# Patient Record
Sex: Male | Born: 1950 | Race: White | Hispanic: No | Smoking: Never smoker
Health system: Southern US, Community
[De-identification: ages and names within clinical notes are randomized; demographics above are authoritative.]

## PROBLEM LIST (undated history)

## (undated) DIAGNOSIS — N200 Calculus of kidney: Secondary | ICD-10-CM

## (undated) DIAGNOSIS — I10 Essential (primary) hypertension: Secondary | ICD-10-CM

## (undated) DIAGNOSIS — E785 Hyperlipidemia, unspecified: Secondary | ICD-10-CM

## (undated) DIAGNOSIS — E669 Obesity, unspecified: Secondary | ICD-10-CM

## (undated) DIAGNOSIS — D45 Polycythemia vera: Secondary | ICD-10-CM

## (undated) DIAGNOSIS — F32A Depression, unspecified: Secondary | ICD-10-CM

## (undated) DIAGNOSIS — I350 Nonrheumatic aortic (valve) stenosis: Secondary | ICD-10-CM

## (undated) DIAGNOSIS — R0989 Other specified symptoms and signs involving the circulatory and respiratory systems: Secondary | ICD-10-CM

## (undated) DIAGNOSIS — F329 Major depressive disorder, single episode, unspecified: Secondary | ICD-10-CM

## (undated) DIAGNOSIS — G4733 Obstructive sleep apnea (adult) (pediatric): Secondary | ICD-10-CM

## (undated) DIAGNOSIS — D126 Benign neoplasm of colon, unspecified: Secondary | ICD-10-CM

## (undated) HISTORY — DX: Polycythemia vera: D45

## (undated) HISTORY — DX: Essential (primary) hypertension: I10

## (undated) HISTORY — DX: Nonrheumatic aortic (valve) stenosis: I35.0

## (undated) HISTORY — DX: Depression, unspecified: F32.A

## (undated) HISTORY — DX: Major depressive disorder, single episode, unspecified: F32.9

## (undated) HISTORY — PX: TONSILLECTOMY: SUR1361

## (undated) HISTORY — DX: Hyperlipidemia, unspecified: E78.5

## (undated) HISTORY — DX: Obesity, unspecified: E66.9

## (undated) HISTORY — DX: Other specified symptoms and signs involving the circulatory and respiratory systems: R09.89

## (undated) HISTORY — DX: Benign neoplasm of colon, unspecified: D12.6

## (undated) HISTORY — DX: Obstructive sleep apnea (adult) (pediatric): G47.33

## (undated) HISTORY — DX: Calculus of kidney: N20.0

---

## 1988-08-30 HISTORY — PX: HEMORRHOID SURGERY: SHX153

## 2000-02-02 ENCOUNTER — Other Ambulatory Visit: Admission: RE | Admit: 2000-02-02 | Discharge: 2000-02-02 | Payer: Self-pay | Admitting: Gastroenterology

## 2000-02-02 ENCOUNTER — Encounter (INDEPENDENT_AMBULATORY_CARE_PROVIDER_SITE_OTHER): Payer: Self-pay

## 2001-08-10 ENCOUNTER — Ambulatory Visit (HOSPITAL_COMMUNITY): Admission: RE | Admit: 2001-08-10 | Discharge: 2001-08-10 | Payer: Self-pay | Admitting: Internal Medicine

## 2005-03-19 ENCOUNTER — Ambulatory Visit: Payer: Self-pay | Admitting: Internal Medicine

## 2005-03-24 ENCOUNTER — Ambulatory Visit: Payer: Self-pay | Admitting: Internal Medicine

## 2005-05-24 ENCOUNTER — Ambulatory Visit: Payer: Self-pay | Admitting: Internal Medicine

## 2005-06-10 ENCOUNTER — Ambulatory Visit: Payer: Self-pay

## 2005-07-29 ENCOUNTER — Ambulatory Visit: Payer: Self-pay | Admitting: Internal Medicine

## 2005-08-09 ENCOUNTER — Ambulatory Visit (HOSPITAL_COMMUNITY): Admission: RE | Admit: 2005-08-09 | Discharge: 2005-08-09 | Payer: Self-pay | Admitting: Internal Medicine

## 2005-09-20 ENCOUNTER — Ambulatory Visit: Payer: Self-pay | Admitting: Internal Medicine

## 2005-09-28 ENCOUNTER — Ambulatory Visit: Payer: Self-pay | Admitting: Pulmonary Disease

## 2005-09-29 ENCOUNTER — Ambulatory Visit (HOSPITAL_BASED_OUTPATIENT_CLINIC_OR_DEPARTMENT_OTHER): Admission: RE | Admit: 2005-09-29 | Discharge: 2005-09-29 | Payer: Self-pay | Admitting: Internal Medicine

## 2005-10-19 ENCOUNTER — Ambulatory Visit: Payer: Self-pay | Admitting: Pulmonary Disease

## 2005-11-22 ENCOUNTER — Ambulatory Visit: Payer: Self-pay | Admitting: Pulmonary Disease

## 2006-03-28 ENCOUNTER — Encounter: Payer: Self-pay | Admitting: Cardiology

## 2006-03-28 ENCOUNTER — Ambulatory Visit: Payer: Self-pay | Admitting: Cardiology

## 2006-03-28 ENCOUNTER — Observation Stay (HOSPITAL_COMMUNITY): Admission: EM | Admit: 2006-03-28 | Discharge: 2006-03-29 | Payer: Self-pay | Admitting: Emergency Medicine

## 2006-04-11 ENCOUNTER — Encounter: Payer: Self-pay | Admitting: Cardiology

## 2006-04-11 ENCOUNTER — Ambulatory Visit: Payer: Self-pay

## 2006-04-15 ENCOUNTER — Ambulatory Visit: Payer: Self-pay | Admitting: Internal Medicine

## 2006-04-29 ENCOUNTER — Ambulatory Visit: Payer: Self-pay | Admitting: Internal Medicine

## 2006-05-04 ENCOUNTER — Ambulatory Visit: Payer: Self-pay | Admitting: Pulmonary Disease

## 2006-05-05 ENCOUNTER — Ambulatory Visit: Payer: Self-pay | Admitting: Pulmonary Disease

## 2006-06-16 ENCOUNTER — Ambulatory Visit: Payer: Self-pay | Admitting: Internal Medicine

## 2006-07-26 ENCOUNTER — Ambulatory Visit: Payer: Self-pay | Admitting: Internal Medicine

## 2006-07-26 LAB — CONVERTED CEMR LAB
Basophils Relative: 0 % (ref 0.0–1.0)
HCT: 51.3 % (ref 39.0–52.0)
Hemoglobin: 17.7 g/dL — ABNORMAL HIGH (ref 13.0–17.0)
MCHC: 34.5 g/dL (ref 30.0–36.0)
Monocytes Absolute: 0.5 10*3/uL (ref 0.2–0.7)
Monocytes Relative: 5.7 % (ref 3.0–11.0)
RBC: 5.6 M/uL (ref 4.22–5.81)
RDW: 12.9 % (ref 11.5–14.6)
Uric Acid, Serum: 7.9 mg/dL — ABNORMAL HIGH (ref 2.4–7.0)

## 2006-12-07 ENCOUNTER — Ambulatory Visit: Payer: Self-pay | Admitting: Internal Medicine

## 2006-12-07 LAB — CONVERTED CEMR LAB
Cholesterol: 172 mg/dL (ref 0–200)
LDL Cholesterol: 120 mg/dL — ABNORMAL HIGH (ref 0–99)
Total CHOL/HDL Ratio: 4.5
Triglycerides: 70 mg/dL (ref 0–149)

## 2007-06-15 ENCOUNTER — Ambulatory Visit: Payer: Self-pay

## 2007-06-15 ENCOUNTER — Encounter: Payer: Self-pay | Admitting: Internal Medicine

## 2007-06-15 ENCOUNTER — Ambulatory Visit: Payer: Self-pay | Admitting: Internal Medicine

## 2007-07-14 ENCOUNTER — Ambulatory Visit: Payer: Self-pay | Admitting: Pulmonary Disease

## 2008-05-31 DIAGNOSIS — J309 Allergic rhinitis, unspecified: Secondary | ICD-10-CM | POA: Insufficient documentation

## 2008-05-31 DIAGNOSIS — E785 Hyperlipidemia, unspecified: Secondary | ICD-10-CM

## 2008-05-31 DIAGNOSIS — N200 Calculus of kidney: Secondary | ICD-10-CM | POA: Insufficient documentation

## 2008-05-31 DIAGNOSIS — I359 Nonrheumatic aortic valve disorder, unspecified: Secondary | ICD-10-CM

## 2008-05-31 DIAGNOSIS — I10 Essential (primary) hypertension: Secondary | ICD-10-CM

## 2008-05-31 DIAGNOSIS — G4730 Sleep apnea, unspecified: Secondary | ICD-10-CM | POA: Insufficient documentation

## 2008-05-31 DIAGNOSIS — F329 Major depressive disorder, single episode, unspecified: Secondary | ICD-10-CM

## 2008-06-03 ENCOUNTER — Ambulatory Visit: Payer: Self-pay | Admitting: Pulmonary Disease

## 2008-06-26 ENCOUNTER — Ambulatory Visit: Payer: Self-pay | Admitting: Internal Medicine

## 2008-06-26 LAB — CONVERTED CEMR LAB
ALT: 56 units/L — ABNORMAL HIGH (ref 0–53)
AST: 36 units/L (ref 0–37)
HDL: 29.9 mg/dL — ABNORMAL LOW (ref >39.0)
LDL Cholesterol: 102 mg/dL — ABNORMAL HIGH (ref 0–99)
Total CHOL/HDL Ratio: 5.7
VLDL: 39 mg/dL (ref 0–40)

## 2008-06-28 ENCOUNTER — Ambulatory Visit: Payer: Self-pay | Admitting: Internal Medicine

## 2008-07-10 ENCOUNTER — Ambulatory Visit: Payer: Self-pay

## 2008-07-10 ENCOUNTER — Encounter: Payer: Self-pay | Admitting: Internal Medicine

## 2008-07-15 ENCOUNTER — Encounter: Payer: Self-pay | Admitting: Internal Medicine

## 2008-07-15 ENCOUNTER — Ambulatory Visit: Payer: Self-pay

## 2008-07-16 ENCOUNTER — Ambulatory Visit (HOSPITAL_COMMUNITY): Admission: RE | Admit: 2008-07-16 | Discharge: 2008-07-16 | Payer: Self-pay | Admitting: Internal Medicine

## 2008-07-29 ENCOUNTER — Ambulatory Visit: Payer: Self-pay | Admitting: Internal Medicine

## 2008-07-29 LAB — CONVERTED CEMR LAB
Basophils Absolute: 0.1 10*3/uL (ref 0.0–0.1)
Basophils Relative: 1.4 % (ref 0.0–3.0)
CO2: 32 meq/L (ref 19–32)
Calcium: 9.2 mg/dL (ref 8.4–10.5)
Creatinine, Ser: 1.5 mg/dL (ref 0.4–1.5)
GFR calc Af Amer: 62 mL/min
HCT: 49.5 % (ref 39.0–52.0)
Hemoglobin: 17.1 g/dL — ABNORMAL HIGH (ref 13.0–17.0)
Lymphocytes Relative: 19.6 % (ref 12.0–46.0)
MCHC: 34.5 g/dL (ref 30.0–36.0)
MCV: 92.3 fL (ref 78.0–100.0)
Monocytes Absolute: 0.8 10*3/uL (ref 0.1–1.0)
Neutro Abs: 3.9 10*3/uL (ref 1.4–7.7)
RBC: 5.36 M/uL (ref 4.22–5.81)
RDW: 12.3 % (ref 11.5–14.6)
aPTT: 54.9 s — ABNORMAL HIGH (ref 21.7–29.8)

## 2008-08-01 ENCOUNTER — Inpatient Hospital Stay (HOSPITAL_BASED_OUTPATIENT_CLINIC_OR_DEPARTMENT_OTHER): Admission: RE | Admit: 2008-08-01 | Discharge: 2008-08-01 | Payer: Self-pay | Admitting: Cardiovascular Disease

## 2008-08-01 ENCOUNTER — Ambulatory Visit: Payer: Self-pay | Admitting: Cardiovascular Disease

## 2008-08-29 ENCOUNTER — Ambulatory Visit: Payer: Self-pay | Admitting: Internal Medicine

## 2008-10-14 DIAGNOSIS — E669 Obesity, unspecified: Secondary | ICD-10-CM | POA: Insufficient documentation

## 2008-10-15 HISTORY — PX: AORTIC VALVE REPLACEMENT: SHX41

## 2008-10-28 ENCOUNTER — Ambulatory Visit: Payer: Self-pay | Admitting: Internal Medicine

## 2008-10-28 LAB — CONVERTED CEMR LAB
AST: 26 units/L (ref 0–37)
Calcium: 9.3 mg/dL (ref 8.4–10.5)
Chloride: 104 meq/L (ref 96–112)
Creatinine, Ser: 1.7 mg/dL — ABNORMAL HIGH (ref 0.4–1.5)
GFR calc non Af Amer: 45 mL/min
HCT: 43 % (ref 39.0–52.0)
MCV: 91.6 fL (ref 78.0–100.0)
Platelets: 418 10*3/uL — ABNORMAL HIGH (ref 150–400)
RDW: 12.7 % (ref 11.5–14.6)
Sodium: 139 meq/L (ref 135–145)

## 2008-11-21 ENCOUNTER — Encounter (HOSPITAL_COMMUNITY): Admission: RE | Admit: 2008-11-21 | Discharge: 2009-02-19 | Payer: Self-pay | Admitting: Internal Medicine

## 2008-11-22 ENCOUNTER — Encounter: Payer: Self-pay | Admitting: Internal Medicine

## 2008-11-22 ENCOUNTER — Ambulatory Visit: Payer: Self-pay | Admitting: Internal Medicine

## 2008-11-22 LAB — CONVERTED CEMR LAB
BUN: 17 mg/dL (ref 6–23)
Creatinine, Ser: 1.1 mg/dL (ref 0.4–1.5)
GFR calc non Af Amer: 73.66 mL/min (ref >60)

## 2008-12-06 ENCOUNTER — Ambulatory Visit: Payer: Self-pay | Admitting: Internal Medicine

## 2008-12-19 ENCOUNTER — Encounter: Payer: Self-pay | Admitting: Internal Medicine

## 2008-12-19 ENCOUNTER — Ambulatory Visit: Payer: Self-pay | Admitting: Internal Medicine

## 2008-12-19 DIAGNOSIS — E663 Overweight: Secondary | ICD-10-CM | POA: Insufficient documentation

## 2008-12-20 LAB — CONVERTED CEMR LAB
Calcium: 9.6 mg/dL (ref 8.4–10.5)
GFR calc non Af Amer: 73.64 mL/min (ref >60)
Magnesium: 2.5 mg/dL (ref 1.5–2.5)
Sodium: 140 meq/L (ref 135–145)

## 2008-12-30 ENCOUNTER — Ambulatory Visit: Payer: Self-pay

## 2008-12-30 ENCOUNTER — Encounter: Payer: Self-pay | Admitting: Internal Medicine

## 2009-01-08 ENCOUNTER — Telehealth: Payer: Self-pay | Admitting: Internal Medicine

## 2009-01-09 ENCOUNTER — Encounter: Payer: Self-pay | Admitting: Internal Medicine

## 2009-01-09 ENCOUNTER — Ambulatory Visit (HOSPITAL_COMMUNITY): Admission: RE | Admit: 2009-01-09 | Discharge: 2009-01-09 | Payer: Self-pay | Admitting: Cardiology

## 2009-01-09 ENCOUNTER — Ambulatory Visit: Payer: Self-pay | Admitting: Cardiology

## 2009-01-23 ENCOUNTER — Telehealth (INDEPENDENT_AMBULATORY_CARE_PROVIDER_SITE_OTHER): Payer: Self-pay | Admitting: *Deleted

## 2009-01-30 ENCOUNTER — Ambulatory Visit: Payer: Self-pay | Admitting: Internal Medicine

## 2009-01-30 DIAGNOSIS — R42 Dizziness and giddiness: Secondary | ICD-10-CM | POA: Insufficient documentation

## 2009-02-05 ENCOUNTER — Encounter: Payer: Self-pay | Admitting: Internal Medicine

## 2009-02-27 ENCOUNTER — Ambulatory Visit: Payer: Self-pay | Admitting: Gastroenterology

## 2009-03-13 ENCOUNTER — Encounter: Payer: Self-pay | Admitting: Gastroenterology

## 2009-03-13 ENCOUNTER — Encounter: Payer: Self-pay | Admitting: Internal Medicine

## 2009-03-13 ENCOUNTER — Ambulatory Visit: Payer: Self-pay | Admitting: Gastroenterology

## 2009-03-17 ENCOUNTER — Telehealth (INDEPENDENT_AMBULATORY_CARE_PROVIDER_SITE_OTHER): Payer: Self-pay | Admitting: *Deleted

## 2009-03-18 ENCOUNTER — Encounter: Payer: Self-pay | Admitting: Gastroenterology

## 2009-03-24 ENCOUNTER — Encounter: Payer: Self-pay | Admitting: Internal Medicine

## 2009-04-07 ENCOUNTER — Telehealth: Payer: Self-pay | Admitting: Internal Medicine

## 2009-05-08 ENCOUNTER — Ambulatory Visit: Payer: Self-pay | Admitting: Internal Medicine

## 2009-05-16 ENCOUNTER — Encounter: Payer: Self-pay | Admitting: Internal Medicine

## 2009-05-21 ENCOUNTER — Encounter: Payer: Self-pay | Admitting: Internal Medicine

## 2009-07-16 ENCOUNTER — Encounter (INDEPENDENT_AMBULATORY_CARE_PROVIDER_SITE_OTHER): Payer: Self-pay | Admitting: *Deleted

## 2009-11-13 ENCOUNTER — Ambulatory Visit: Payer: Self-pay | Admitting: Internal Medicine

## 2009-11-13 DIAGNOSIS — Z954 Presence of other heart-valve replacement: Secondary | ICD-10-CM

## 2009-11-13 DIAGNOSIS — I119 Hypertensive heart disease without heart failure: Secondary | ICD-10-CM | POA: Insufficient documentation

## 2009-12-01 ENCOUNTER — Telehealth: Payer: Self-pay | Admitting: Internal Medicine

## 2009-12-04 ENCOUNTER — Ambulatory Visit: Payer: Self-pay

## 2009-12-04 ENCOUNTER — Ambulatory Visit: Payer: Self-pay | Admitting: Cardiology

## 2009-12-04 ENCOUNTER — Ambulatory Visit (HOSPITAL_COMMUNITY): Admission: RE | Admit: 2009-12-04 | Discharge: 2009-12-04 | Payer: Self-pay | Admitting: Internal Medicine

## 2009-12-04 ENCOUNTER — Encounter (INDEPENDENT_AMBULATORY_CARE_PROVIDER_SITE_OTHER): Payer: Self-pay | Admitting: *Deleted

## 2009-12-04 ENCOUNTER — Ambulatory Visit: Payer: Self-pay | Admitting: Internal Medicine

## 2009-12-04 ENCOUNTER — Encounter: Payer: Self-pay | Admitting: Internal Medicine

## 2009-12-09 LAB — CONVERTED CEMR LAB
Eosinophils Relative: 3.4 % (ref 0.0–5.0)
GFR calc non Af Amer: 73.39 mL/min (ref >60)
HCT: 50.8 % (ref 39.0–52.0)
Hemoglobin: 17.5 g/dL — ABNORMAL HIGH (ref 13.0–17.0)
Lymphs Abs: 2.2 10*3/uL (ref 0.7–4.0)
Monocytes Relative: 7.6 % (ref 3.0–12.0)
Platelets: 183 10*3/uL (ref 150.0–400.0)
Potassium: 3.2 meq/L — ABNORMAL LOW (ref 3.5–5.1)
Sodium: 141 meq/L (ref 135–145)
TSH: 1.42 microintl units/mL (ref 0.35–5.50)
WBC: 9.4 10*3/uL (ref 4.5–10.5)

## 2009-12-15 ENCOUNTER — Encounter: Payer: Self-pay | Admitting: Internal Medicine

## 2009-12-15 ENCOUNTER — Ambulatory Visit: Payer: Self-pay | Admitting: Internal Medicine

## 2009-12-15 ENCOUNTER — Telehealth (INDEPENDENT_AMBULATORY_CARE_PROVIDER_SITE_OTHER): Payer: Self-pay | Admitting: *Deleted

## 2009-12-15 DIAGNOSIS — R002 Palpitations: Secondary | ICD-10-CM | POA: Insufficient documentation

## 2009-12-15 LAB — CONVERTED CEMR LAB
BUN: 15 mg/dL (ref 6–23)
Chloride: 107 meq/L (ref 96–112)
Potassium: 4.6 meq/L (ref 3.5–5.1)

## 2010-01-09 ENCOUNTER — Telehealth: Payer: Self-pay | Admitting: Internal Medicine

## 2010-01-09 ENCOUNTER — Encounter: Payer: Self-pay | Admitting: Internal Medicine

## 2010-04-03 ENCOUNTER — Encounter: Payer: Self-pay | Admitting: Internal Medicine

## 2010-04-06 ENCOUNTER — Encounter: Payer: Self-pay | Admitting: Internal Medicine

## 2010-04-10 ENCOUNTER — Ambulatory Visit: Payer: Self-pay | Admitting: Internal Medicine

## 2010-04-24 ENCOUNTER — Ambulatory Visit (HOSPITAL_COMMUNITY): Admission: RE | Admit: 2010-04-24 | Discharge: 2010-04-24 | Payer: Self-pay | Admitting: Urology

## 2010-05-14 ENCOUNTER — Telehealth: Payer: Self-pay | Admitting: Internal Medicine

## 2010-05-15 ENCOUNTER — Ambulatory Visit: Payer: Self-pay | Admitting: Internal Medicine

## 2010-05-16 LAB — CONVERTED CEMR LAB
AST: 25 units/L (ref 0–37)
Basophils Relative: 1.6 % (ref 0.0–3.0)
Bilirubin Urine: NEGATIVE
CO2: 32 meq/L (ref 19–32)
Chloride: 104 meq/L (ref 96–112)
Creatinine, Ser: 1.2 mg/dL (ref 0.4–1.5)
Eosinophils Absolute: 0.4 10*3/uL (ref 0.0–0.7)
HCT: 53 % — ABNORMAL HIGH (ref 39.0–52.0)
Hemoglobin, Urine: NEGATIVE
Hemoglobin: 18.2 g/dL — ABNORMAL HIGH (ref 13.0–17.0)
Lymphs Abs: 1.9 10*3/uL (ref 0.7–4.0)
MCHC: 34.4 g/dL (ref 30.0–36.0)
MCV: 91.2 fL (ref 78.0–100.0)
Monocytes Absolute: 0.8 10*3/uL (ref 0.1–1.0)
Neutro Abs: 4.8 10*3/uL (ref 1.4–7.7)
Potassium: 4.5 meq/L (ref 3.5–5.1)
RBC: 5.82 M/uL — ABNORMAL HIGH (ref 4.22–5.81)
Sodium: 143 meq/L (ref 135–145)
Total Protein, Urine: NEGATIVE mg/dL
Urine Glucose: NEGATIVE mg/dL
pH: 5 (ref 5.0–8.0)

## 2010-07-31 ENCOUNTER — Encounter: Admission: RE | Admit: 2010-07-31 | Discharge: 2010-07-31 | Payer: Self-pay | Admitting: Internal Medicine

## 2010-08-06 ENCOUNTER — Emergency Department (HOSPITAL_COMMUNITY): Admission: EM | Admit: 2010-08-06 | Discharge: 2010-02-12 | Payer: Self-pay | Admitting: Emergency Medicine

## 2010-09-29 NOTE — Assessment & Plan Note (Signed)
Summary: rov/jss   Primary Ireoluwa Grant:  Eric Form   History of Present Illness: Patient is a 60 year old with history of bicuspid AV and Aortic stenosis.  He is s/p AVR in 2010.  Also a hsitory of hypertension, obesity, sleep apnea.  I saw him in clinic earlier ths year. Since seen he has been doing well.  He denies dizziness, no chest pains.  He denies any dizziness like he has had in the past.  Still is not exercising regularly, blames it on the heat this summer.  Problems Prior to Update: 1)  Encounter For Long-term Use of Other Medications  (ICD-V58.69) 2)  Palpitations  (ICD-785.1) 3)  Aortic Valve Replacement, Hx of  (ICD-V43.3) 4)  Ben Htn Heart Disease Without Heart Fail  (ICD-402.10) 5)  Dizziness  (ICD-780.4) 6)  Overweight/obesity  (ICD-278.02) 7)  Aortic Stenosis  (ICD-424.1) 8)  Hypertension  (ICD-401.9) 9)  Sleep Apnea  (ICD-780.57) 10)  Dyslipidemia  (ICD-272.4) 11)  Depression  (ICD-311) 12)  Allergic Rhinitis  (ICD-477.9) 13)  Nephrolithiasis  (ICD-592.0)  Current Medications (verified): 1)  Wellbutrin Xl 300 Mg Xr24h-Tab (Bupropion Hcl) .... Take 1 Tablet By Mouth Once A Day 2)  Multivitamin Tablet Otc .... Take 1 Tablet By Mouth Once A Day 3)  Acetaminophen Pm 500-25 Mg Tabs (Diphenhydramine-Apap (Sleep)) .... As Needed 4)  Ibuprofen 400 Mg Tabs (Ibuprofen) .... As Needed 5)  Bystolic 5 Mg Tabs (Nebivolol Hcl) .... 1/3 of A  Tab By Mouth Once Daily 6)  Synthroid 25 Mcg Tabs (Levothyroxine Sodium) .... Take 1 Tablet By Mouth Once A Day 7)  Hydrochlorothiazide 12.5 Mg Tabs (Hydrochlorothiazide) .... Take One Tablet By Mouth Daily. 8)  Nuvigil .Marland Kitchen.. 1 Tab By Mouth Once Daily 9)  Androgel .... As Directed  Allergies: No Known Drug Allergies  Past History:  Family History: Last updated: 09/10/2008 Mother with CAD Father deceased.  Hx DM , CAD,  Social History: Last updated: 09/10/2008 Divorced. 1 adopted daughter Gerrit Friends No tobacco Rare EtOH  Past  medical, surgical, family and social histories (including risk factors) reviewed, and no changes noted (except as noted below).  Past Medical History: Reviewed history from 09/10/2008 and no changes required. Current Problems:  DEPRESSION (ICD-311) ALLERGIC RHINITIS (ICD-477.9) NEPHROLITHIASIS (ICD-592.0) DYSLIPIDEMIA (ICD-272.4) AORTIC STENOSIS (ICD-424.1) HYPERTENSION (ICD-401.9) SLEEP APNEA (ICD-780.57) Obesity  Sinus problems  Past Surgical History: Reviewed history from 01/30/2009 and no changes required. tonsillectomy AVR  Family History: Reviewed history from 09/10/2008 and no changes required. Mother with CAD Father deceased.  Hx DM , CAD,  Social History: Reviewed history from 09/10/2008 and no changes required. Divorced. 1 adopted daughter Gerrit Friends No tobacco Rare EtOH  Vital Signs:  Patient profile:   60 year old male Height:      73 inches Weight:      260 pounds BMI:     34.43 Pulse rate:   72 / minute Resp:     18 per minute BP sitting:   130 / 91  Vitals Entered By: Burnett Kanaris, CNA (April 10, 2010 3:21 PM)  Physical Exam  Additional Exam:  Patient is in NAD HEENT:  Normocephalic, atraumatic. EOMI, PERRLA.  Neck: JVP is normal. No thyromegaly. No bruits.  Lungs: clear to auscultation. No rales no wheezes.  Heart: Regular rate and rhythm. Normal S1, S2. No S3.   Gr. II/VI systolic murmur L sternal border to base.   PMI not displaced.  Abdomen:  Supple, nontender. Normal bowel sounds. No masses. No hepatomegaly.  Extremities:   Good distal pulses throughout. No lower extremity edema.  Musculoskeletal :moving all extremities.  Neuro:   alert and oriented x3.    EKG  Procedure date:  04/10/2010  Findings:      NSR.  70 bpm.  SL ST depression, biphasic T waves I,  VR to V6.  T wave inversion AVL.  Impression & Recommendations:  Problem # 1:  AORTIC VALVE REPLACEMENT, HX OF (ICD-V43.3) Cliniclally Kathlene November is doing well.  N dizzines, no  SOB.  ON exam the systolic murmur is less pronounced than previous.  His volume status must be better.  Echo in April  showed valve gradients good.  LV is hyperdynamic, smal with mild LVH  There was near cavity obliteration in the mid LV I would not change regimen for now.  Encouraged him to increase his activity with walking.    Problem # 2:  HYPERTENSION (ICD-401.9) Blood pressure is good.  COntinue current regimen.  Problem # 3:  SLEEP APNEA (ICD-780.57) Using CPAP regularly  Problem # 4:  DYSLIPIDEMIA (ICD-272.4) Need to check on fasting lipids.  Will contacted Dr. Alver Fisher office.  Problem # 5:  PALPITATIONS (ICD-785.1) Patient is without complaints.  Other Orders: EKG w/ Interpretation (93000)  Appended Document: rov/jss Recent CT for kidney stones showed small (6 mm) nodule in RLL lung   Will need f/u.  Appended Document: rov/jss Dr. Clelia Croft did not complete a lipid panel on this patient.  Appended Document: rov/jss Needs to come back for fasting lipid panel per Dr.Ross.  Appended Document: rov/jss Patient having kidney stone removal this week.Marland Kitchen.he will call me back to schedule fasting lipid panel.

## 2010-09-29 NOTE — Miscellaneous (Signed)
Summary: Outpatient Coinsurance Notice  Outpatient Coinsurance Notice   Imported By: Marylou Mccoy 12/05/2009 18:12:26  _____________________________________________________________________  External Attachment:    Type:   Image     Comment:   External Document

## 2010-09-29 NOTE — Procedures (Signed)
Summary: Holter and Event  Holter and Event   Imported By: Erle Crocker 01/23/2010 09:01:00  _____________________________________________________________________  External Attachment:    Type:   Image     Comment:   External Document

## 2010-09-29 NOTE — Progress Notes (Signed)
Summary: LOW BP  Phone Note Call from Patient Call back at Work Phone [phone] Call back at [phone]   Caller: Patient Summary of Call: BP THIS AM 90/50, WITHOUT BP MEDS Initial call taken by: Migdalia Dk,  December 01, 2009 10:13 AM  Follow-up for Phone Call        Called patient back...he states that he felt tired all weekend and just rested in bed....took his bp medications late yesterday 5 pm instead of the usual time in the am. Today at work his BP was 90/50 with HR of 78 taken by an Charity fundraiser ....she won't be back again until Friday. His only complaint is feeling tired. Advised him to hold BP medication today and will discuss issues with Dr.Mehgan Santmyer and call him back later. Follow-up by: Suzan Garibaldi RN  Additional Follow-up for Phone Call Additional follow up Details #1::        Patient  was tired all day saturday  Only got up for dinner.  Went to bed at 9.  Woke up  sun morning.  DID not take meds until 5 PM.  Went to bed at 11.  Today weak.  Did not take meds. Rec:  Hold meds.  Get echo thursday.  Will sched BMET, CBC, TSH, cortisol as well on thursday. Additional Follow-up by: Sherrill Raring, MD, [facility],  December 01, 2009 3:42 PM     Appended Document: LOW BP labs scheduled for Thursday.

## 2010-09-29 NOTE — Assessment & Plan Note (Signed)
Summary: add on pre syncope  845 am appointment   Visit Type:  Follow-up Primary Meah Jiron:  Eric Form  CC:  dizziness.  History of Present Illness: Patient is a 60 year old with a history of AS (s/p AVR in 2010).  I last saw him in clinic this summer. He called on Friday saying he had a spell at work.  He woke up, felt fine.  Had breakfast.  Micah Flesher to work.  At around 11 AM he was sitting at his desk when he became dizzy.  No chest tightness.  Some nausea.  No other pains.  Episode lasted about 10 minutes.  No palpitations.  After the episode he did go to a meeting but after that went home because he wasn't feeling right.  Over the weekend he had no further spells but just took it easy. Note that he  had recent kidney stones.  Required stent to remove.  Seen by Dr. Hillis Range earlier this wk and released.  No fevers, no diarrhea, no disuria.  Current Medications (verified): 1)  Wellbutrin Xl 300 Mg Xr24h-Tab (Bupropion Hcl) .... Take 1 Tablet By Mouth Once A Day 2)  Multivitamin Tablet Otc .... Take 1 Tablet By Mouth Once A Day 3)  Acetaminophen Pm 500-25 Mg Tabs (Diphenhydramine-Apap (Sleep)) .... As Needed 4)  Ibuprofen 400 Mg Tabs (Ibuprofen) .... As Needed 5)  Bystolic 5 Mg Tabs (Nebivolol Hcl) .... 1/3 of A  Tab By Mouth Once Daily 6)  Synthroid 25 Mcg Tabs (Levothyroxine Sodium) .... Take 1 Tablet By Mouth Once A Day 7)  Hydrochlorothiazide 12.5 Mg Tabs (Hydrochlorothiazide) .... Take One Tablet By Mouth Daily. 8)  Nuvigil .Marland Kitchen.. 1 Tab By Mouth Once Daily 9)  Androgel .... As Directed  Allergies (verified): No Known Drug Allergies  Past History:  Past medical, surgical, family and social histories (including risk factors) reviewed, and no changes noted (except as noted below).  Past Medical History: Reviewed history from 09/10/2008 and no changes required. Current Problems:  DEPRESSION (ICD-311) ALLERGIC RHINITIS (ICD-477.9) NEPHROLITHIASIS (ICD-592.0) DYSLIPIDEMIA  (ICD-272.4) AORTIC STENOSIS (ICD-424.1) HYPERTENSION (ICD-401.9) SLEEP APNEA (ICD-780.57) Obesity  Sinus problems  Past Surgical History: Reviewed history from 01/30/2009 and no changes required. tonsillectomy AVR  Family History: Reviewed history from 09/10/2008 and no changes required. Mother with CAD Father deceased.  Hx DM , CAD,  Social History: Reviewed history from 09/10/2008 and no changes required. Divorced. 1 adopted daughter Gerrit Friends No tobacco Rare EtOH  Vital Signs:  Patient profile:   60 year old male Height:      73 inches Weight:      260 pounds Pulse (ortho):   71 / minute BP standing:   134 / 96 Cuff size:   large  Vitals Entered By: Burnett Kanaris, CNA (May 15, 2010 8:51 AM)  Serial Vital Signs/Assessments:  Time      Position  BP       Pulse  Resp  Temp     By 0 min     Lying LA  122/91   63                    Burnett Kanaris, CNA 0 min     Sitting   142/98   66                    Burnett Kanaris, CNA 0 min     Standing  134/96   71  Burnett Kanaris, CNA 2 min     Standing  146/92   67                    Burnett Kanaris, CNA 3 min     Standing  137/91   70                    Burnett Kanaris, CNA  Comments: 0 min no dizziness when pt sat up-------When pt stood up he did feel a little dizziness intially---pt still has a lttle dizziness By: Burnett Kanaris, CNA  2 min pt continues to have just a little of dizziness By: Burnett Kanaris, CNA  3 min pt still has a little dizziness By: Burnett Kanaris, CNA    Physical Exam  Additional Exam:  Patient is a 60 year old in NAD HEENT:  Normocephalic, atraumatic. EOMI, PERRLA.  Neck: JVP is normal. No thyromegaly. No bruits.  Lungs: clear to auscultation. No rales no wheezes.  Heart: Regular rate and rhythm. Normal S1, S2. No S3.  Gr II/VI systolic murmur L sternal border. PMI not displaced.  Abdomen:  Supple.  Very mild RLQ tenderness.   Normal bowel sounds. No masses. No  hepatomegaly.  Extremities:   Good distal pulses throughout. No lower extremity edema.  Musculoskeletal :moving all extremities.  Neuro:   alert and oriented x3.    EKG  Procedure date:  05/15/2010  Findings:      NSR.  63 bpm.  T wave inversion V5, V6, I, AVL  Impression & Recommendations:  Problem # 1:  DIZZINESS (ICD-780.4)  I am not sure what the spell represents.  Exam remarkable for only minimal RLQ tenderness.    He says he is drinking I would recomm getting blood work and a UA today.   Encouraged him to drink ample fluids. Keep on same meds.  Problem # 2:  HYPERTENSION (ICD-401.9) Keep on same regimen for now. His updated medication list for this problem includes:    Bystolic 5 Mg Tabs (Nebivolol hcl) .Marland Kitchen... 1/3 of a  tab by mouth once daily    Hydrochlorothiazide 12.5 Mg Tabs (Hydrochlorothiazide) .Marland Kitchen... Take one tablet by mouth daily.  Problem # 3:  OVERWEIGHT/OBESITY (ICD-278.02) SAys he is going to work hard on wt loss.  Problem # 4:  SLEEP APNEA (ICD-780.57) Using CPAP  Problem # 5:  HYPERLIPIDEMIA-MIXED (ICD-272.4) Fasting panel today.  Other Orders: EKG w/ Interpretation (93000) T- * Misc. Laboratory test [phone]) TLB-AST (SGOT) (84450-SGOT) TLB-BMP (Basic Metabolic Panel-BMET) (80048-METABOL) TLB-CBC Platelet - w/Differential (85025-CBCD) TLB-Sedimentation Rate (ESR) (85652-ESR) TLB-Udip w/ Micro (81001-URINE)  Patient Instructions: 1)  Your physician recommends that you return for lab work in: lab work today .Marland KitchenMarland KitchenMarland Kitchenwe will call you with results   Appended Document: add on pre syncope  845 am appointment Error:  Patient called Thursday.  Felt OK overnight.  NOt over weekend.

## 2010-09-29 NOTE — Progress Notes (Signed)
Summary: pt dizzy-wants appt today  Phone Note Call from Patient   Caller: Patient Reason for Call: Talk to Nurse Summary of Call: pt wanted to see dr Tenny Craw today-told him not here-wants to talk to nurse-having dizziness for 20 min off and on, denies chest pain , sob and fainting [phone] Initial call taken by: Glynda Jaeger,  May 14, 2010 11:42 AM  Follow-up for Phone Call        Called patient back...he had a 10 to 20 second episode of dizziness and nausea today when sitting at his desk. Currently he feels fine. Advised him to come to office tomorrow to see Dr.Ross at 845 am. He will fast in the morning so he can have Lipid panel done that Dr.Ross had requested previously. Layne Benton, RN, BSN  May 14, 2010 4:37 PM

## 2010-09-29 NOTE — Assessment & Plan Note (Signed)
Summary: f60m   Visit Type:  4 months follow up Primary Provider:  Eric Form  CC:  Dizziness when BP is low.  History of Present Illness: Patient is a 60 year old with a history of aortic stenosis (s/p replacement march 2010), hypertension, obesity, sleep apnea and dyslipidemia.  I last saw him in the fall. SInce seen, he has not felt great.  He says he has no energiy.  He goes to work, comes home and goes to bed.  Denies PND, no chest pain, no dizziness.  His bp's at work have been lower, 90s to 110s systolic.      Current Medications (verified): 1)  Wellbutrin Xl 300 Mg Xr24h-Tab (Bupropion Hcl) .... Take 1 Tablet By Mouth Once A Day 2)  Multivitamin Tablet Otc .... Take 1 Tablet By Mouth Once A Day 3)  Acetaminophen Pm 500-25 Mg Tabs (Diphenhydramine-Apap (Sleep)) .... As Needed 4)  Ibuprofen 400 Mg Tabs (Ibuprofen) .... As Needed 5)  Bystolic 5 Mg Tabs (Nebivolol Hcl) .... Take 1 Tablet By Mouth Once A Day 6)  Hyzaar 100-25 Mg Tabs (Losartan Potassium-Hctz) .Marland Kitchen.. 1 Tab Daily 7)  Synthroid 25 Mcg Tabs (Levothyroxine Sodium) .... Take 1 Tablet By Mouth Once A Day  Allergies (verified): No Known Drug Allergies  Past History:  Past Medical History: Last updated: 09/10/2008 Current Problems:  DEPRESSION (ICD-311) ALLERGIC RHINITIS (ICD-477.9) NEPHROLITHIASIS (ICD-592.0) DYSLIPIDEMIA (ICD-272.4) AORTIC STENOSIS (ICD-424.1) HYPERTENSION (ICD-401.9) SLEEP APNEA (ICD-780.57) Obesity  Sinus problems PMH-FH-SH reviewed-no changes except otherwise noted  Vital Signs:  Patient profile:   60 year old male Height:      73 inches Weight:      255 pounds BMI:     33.76 Pulse rate:   66 / minute Pulse rhythm:   regular Resp:     18 per minute BP sitting:   124 / 88  (left arm) Cuff size:   large  Vitals Entered By: Burnett Kanaris, CNA (November 13, 2009 2:38 PM)  Physical Exam  Additional Exam:  Patient is an obese 60 year old in NAD HEENT:  Normocephalic, atraumatic. EOMI,  PERRLA.  Neck: JVP is normal. No thyromegaly. No bruits.  Lungs: clear to auscultation. No rales no wheezes.  Heart: Regular rate and rhythm. Normal S1, S2. No S3.   Gr II/VI systolic murmur at L sternal border PMI not displaced.  Abdomen:  Supple, nontender. Normal bowel sounds. No masses. No hepatomegaly.  Extremities:   Good distal pulses throughout. No lower extremity edema.  Musculoskeletal :moving all extremities.  Neuro:   alert and oriented x3.    EKG  Procedure date:  11/13/2009  Findings:      NSR.  66 bpm.  T wave inversion I, AVL  Impression & Recommendations:  Problem # 1:  AORTIC VALVE REPLACEMENT, HX OF (ICD-V43.3) Patient is now 1 year post AV replacement.  TEE this summer shows  normal valve function.  The LV was hyperdynamic with a subvlalvular gradient.  I placed him on bystolic but he says this and other b blockers make him feel groggy.  He is also on Hyzaar since Cozaar was not effective in lowering his BP. I will back off on the b blocker to 2.5.  He should call in about a wk to see how he is doing.  Continue Hyzaar   May need to back off on Hyzaar. Will get echo to reeval LV   Problem # 2:  BEN HTN HEART DISEASE WITHOUT HEART FAIL (ICD-402.10) I am  puzzled by how he has gone from being hypertensive to near hypotensive at times.  I had recomm bystolic to increase filling time.  He has not tolerated.  I would back down to 2.5 mg per day.  May need to back down on Hyzaar as well.  Problem # 3:  DYSLIPIDEMIA (ICD-272.4) Will resume Simcor.  Problem # 4:  OVERWEIGHT/OBESITY (ICD-278.02) Discussed.  Hopefully with changes he  will feel a little more energetic and want to exercise.  Problem # 5:  SLEEP APNEA (ICD-780.57) Says he is not snoring or having apnea  Not using CPAP at present.  Other Orders: EKG w/ Interpretation (93000) Echocardiogram (Echo)  Patient Instructions: 1)  Your physician has requested that you have an echocardiogram.  Echocardiography  is a painless test that uses sound waves to create images of your heart. It provides your doctor with information about the size and shape of your heart and how well your heart's chambers and valves are working.  This procedure takes approximately one hour. There are no restrictions for this procedure. 2)  Your physician has recommended you make the following change in your medication: decrease Bystolic to 2.5 mg every day and start Simcor 20/1000mg  every day.   Appended Document: f35m Glucose values in low 100s.

## 2010-09-29 NOTE — Progress Notes (Signed)
  Phone Note Outgoing Call   Call placed by: Dietrich Pates Summary of Call: Called patient.  Has not had any spells of racing Saw Eric Form.  Placed on diuretic.  Lost 10 lbs.  Also on Nuvigil.  Also on testosterone patch.  Feeling better No dizzines.  BP today was 120/66 I encouraged him to walk F/U with D shaw in 3 months I should see him October, sooner for problems. Need to check on statin with brown gardiner. Initial call taken by: Sherrill Raring, MD, [facility],  Jan 09, 2010 6:03 PM     Appended Document:  Need labs from [facility] [phone]

## 2010-09-29 NOTE — Miscellaneous (Signed)
  Clinical Lists Changes  Medications: Added new medication of HYDROCHLOROTHIAZIDE 12.5 MG TABS (HYDROCHLOROTHIAZIDE) Take one tablet by mouth daily. - Signed

## 2010-09-29 NOTE — Progress Notes (Signed)
  Phone Note Other Incoming   Caller: dr Dietrich Pates Summary of Call: received call from dr Tenny Craw, pt needing an event monitor for atrial fib and also a repeat bmp. spoke with pt , he will be here at 11am today. Initial call taken by: Deliah Goody, RN,  December 15, 2009 8:24 AM  New Problems: PALPITATIONS (ICD-785.1)   New Problems: PALPITATIONS (ICD-785.1)

## 2010-10-06 ENCOUNTER — Encounter: Payer: Self-pay | Admitting: Internal Medicine

## 2010-10-08 ENCOUNTER — Encounter: Payer: Self-pay | Admitting: Internal Medicine

## 2010-10-09 ENCOUNTER — Other Ambulatory Visit: Payer: Self-pay | Admitting: Dermatology

## 2010-10-15 ENCOUNTER — Ambulatory Visit (INDEPENDENT_AMBULATORY_CARE_PROVIDER_SITE_OTHER): Payer: 59 | Admitting: Internal Medicine

## 2010-10-15 ENCOUNTER — Encounter: Payer: Self-pay | Admitting: Internal Medicine

## 2010-10-15 DIAGNOSIS — I119 Hypertensive heart disease without heart failure: Secondary | ICD-10-CM

## 2010-10-15 DIAGNOSIS — I359 Nonrheumatic aortic valve disorder, unspecified: Secondary | ICD-10-CM

## 2010-10-27 NOTE — Assessment & Plan Note (Signed)
Summary: fu appt per tp/mt   Visit Type:  Follow-up Primary Provider:  Eric Form  CC:  swelling in feet.  History of Present Illness: Patient is a 60 year old with a history of AS (s/p AVR in 2010), HTn, sleep apnea.  I saw him back in the fall.  At that time he ws having problems with dizziness Since then he has done well.  He says he is actually feeling the best he has felt in a long time. No dizzness.   NO SOB.  No CP. Seen be D Clelia Croft recently.  Note plans to start a new medicine to help with wt lss  Current Medications (verified): 1)  Wellbutrin Xl 300 Mg Xr24h-Tab (Bupropion Hcl) .... Take 1 Tablet By Mouth Once A Day 2)  Multivitamin Tablet Otc .... Take 1 Tablet By Mouth Once A Day 3)  Acetaminophen Pm 500-25 Mg Tabs (Diphenhydramine-Apap (Sleep)) .... As Needed 4)  Ibuprofen 400 Mg Tabs (Ibuprofen) .... As Needed 5)  Bystolic 5 Mg Tabs (Nebivolol Hcl) .... 1/3 of A  Tab By Mouth Once Daily 6)  Synthroid 25 Mcg Tabs (Levothyroxine Sodium) .... Take 1 Tablet By Mouth Once A Day 7)  Hydrochlorothiazide 12.5 Mg Tabs (Hydrochlorothiazide) .... Take One Tablet By Mouth Daily. 8)  Nuvigil .Marland Kitchen.. 1 Tab By Mouth Once Daily 9)  Androgel .... As Directed  Allergies (verified): No Known Drug Allergies  Past History:  Past medical, surgical, family and social histories (including risk factors) reviewed, and no changes noted (except as noted below).  Past Medical History: Reviewed history from 09/10/2008 and no changes required. Current Problems:  DEPRESSION (ICD-311) ALLERGIC RHINITIS (ICD-477.9) NEPHROLITHIASIS (ICD-592.0) DYSLIPIDEMIA (ICD-272.4) AORTIC STENOSIS (ICD-424.1) HYPERTENSION (ICD-401.9) SLEEP APNEA (ICD-780.57) Obesity  Sinus problems  Past Surgical History: Reviewed history from 01/30/2009 and no changes required. tonsillectomy AVR  Family History: Reviewed history from 09/10/2008 and no changes required. Mother with CAD Father deceased.  Hx DM ,  CAD,  Social History: Reviewed history from 09/10/2008 and no changes required. Divorced. 1 adopted daughter Gerrit Friends No tobacco Rare EtOH  Review of Systems       All systems reviewed  Neg to the above problem exceptas noted above.  Vital Signs:  Patient profile:   60 year old male Height:      73 inches Weight:      261.50 pounds BMI:     34.63 Pulse rate:   65 / minute BP sitting:   132 / 88  (left arm) Cuff size:   regular  Vitals Entered By: Caralee Ates CMA (October 15, 2010 10:35 AM)  Physical Exam  Additional Exam:  patient is in NAD HEENT:  Normocephalic, atraumatic. EOMI, PERRLA.  Neck: JVP is normal. No thyromegaly. No bruits.  Lungs: clear to auscultation. No rales no wheezes.  Heart: Regular rate and rhythm. Normal S1, S2. No S3.   No significant murmurs. PMI not displaced.  Abdomen:  Supple, nontender. Normal bowel sounds. No masses. No hepatomegaly.  Extremities:   Good distal pulses throughout. No lower extremity edema.  Musculoskeletal :moving all extremities.  Neuro:   alert and oriented x3.    EKG  Procedure date:  10/15/2010  Findings:      NSR.  65 bpm.  Sl ST depression, T wave inversion I, AVL, V4 to V6.  Impression & Recommendations:  Problem # 1:  AORTIC VALVE REPLACEMENT, HX OF (ICD-V43.3) Symptms improved.  Keep on same regimen.  Stay active.  Watch fluids.  Does  not toleate dehydration.  Problem # 2:  HYPERLIPIDEMIA-MIXED (ICD-272.4) Will get labs from Dr. Alver Fisher office.  Problem # 3:  BEN HTN HEART DISEASE WITHOUT HEART FAIL (ICD-402.10) Assessment: Improved ON very low doses of meds.  BP fair.  Encouraged him to lose wt. His updated medication list for this problem includes:    Bystolic 5 Mg Tabs (Nebivolol hcl) .Marland Kitchen... 1/3 of a  tab by mouth once daily    Hydrochlorothiazide 12.5 Mg Tabs (Hydrochlorothiazide) .Marland Kitchen... Take one tablet by mouth daily.  Problem # 4:  SLEEP APNEA (ICD-780.57) Using CPAP.

## 2010-10-30 ENCOUNTER — Telehealth (INDEPENDENT_AMBULATORY_CARE_PROVIDER_SITE_OTHER): Payer: Self-pay | Admitting: *Deleted

## 2010-11-10 NOTE — Letter (Signed)
Summary: [facility]  [facility]   Imported By: Kassie Mends 11/04/2010 11:21:14  _____________________________________________________________________  External Attachment:    Type:   Image     Comment:   External Document

## 2010-11-10 NOTE — Progress Notes (Signed)
Summary: New CPAP  Phone Note Call from Patient Call back at Work Phone [phone]   Caller: Patient Summary of Call: Patient is eligible for new CPAP machine.  His current one is 58 years old.  In order to get new machine, [facility] needs new order from Dr. Shelle Iron.  Please call patient at [phone], tell person who answers that you are calling from Dr's office and to find patient. Initial call taken by: Leonette Monarch,  October 30, 2010 2:54 PM  Follow-up for Phone Call        kc i need and order for this if it is ok Follow-up by: Oneita Jolly,  October 30, 2010 4:09 PM  Additional Follow-up for Phone Call Additional follow up Details #1::        it appears that I have not seen him since 2009.  needs ov b4 I can approve this. Additional Follow-up by: Barbaraann Share MD,  October 30, 2010 5:47 PM    Additional Follow-up for Phone Call Additional follow up Details #2::    spoke to pt and he agreed to schedule an appt he was transferred to schedulers to set up this appt  Follow-up by: Oneita Jolly,  November 02, 2010 10:11 AM

## 2010-11-13 LAB — COMPREHENSIVE METABOLIC PANEL
Albumin: 4 g/dL (ref 3.5–5.2)
BUN: 19 mg/dL (ref 6–23)
Calcium: 9.2 mg/dL (ref 8.4–10.5)
Glucose, Bld: 115 mg/dL — ABNORMAL HIGH (ref 70–99)
Total Protein: 6.7 g/dL (ref 6.0–8.3)

## 2010-11-13 LAB — CBC
HCT: 50.3 % (ref 39.0–52.0)
MCHC: 35.1 g/dL (ref 30.0–36.0)
MCV: 89.3 fL (ref 78.0–100.0)
Platelets: 169 10*3/uL (ref 150–400)
RDW: 13.7 % (ref 11.5–15.5)

## 2010-11-13 LAB — SURGICAL PCR SCREEN: MRSA, PCR: NEGATIVE

## 2010-11-15 LAB — URINALYSIS, ROUTINE W REFLEX MICROSCOPIC
Glucose, UA: NEGATIVE mg/dL
Leukocytes, UA: NEGATIVE
Specific Gravity, Urine: 1.023 (ref 1.005–1.030)
pH: 5 (ref 5.0–8.0)

## 2010-11-15 LAB — URINE MICROSCOPIC-ADD ON

## 2010-11-15 LAB — CBC
HCT: 51.1 % (ref 39.0–52.0)
MCV: 91.7 fL (ref 78.0–100.0)
Platelets: 160 10*3/uL (ref 150–400)
WBC: 9 10*3/uL (ref 4.0–10.5)

## 2010-11-15 LAB — URINE CULTURE

## 2010-11-15 LAB — DIFFERENTIAL
Eosinophils Absolute: 0.3 10*3/uL (ref 0.0–0.7)
Eosinophils Relative: 3 % (ref 0–5)
Lymphs Abs: 1.3 10*3/uL (ref 0.7–4.0)

## 2010-11-15 LAB — BASIC METABOLIC PANEL
BUN: 17 mg/dL (ref 6–23)
CO2: 28 mEq/L (ref 19–32)
Chloride: 103 mEq/L (ref 96–112)
Glucose, Bld: 158 mg/dL — ABNORMAL HIGH (ref 70–99)
Potassium: 4.2 mEq/L (ref 3.5–5.1)

## 2010-12-07 LAB — GLUCOSE, CAPILLARY: Glucose-Capillary: 137 mg/dL — ABNORMAL HIGH (ref 70–99)

## 2010-12-08 ENCOUNTER — Encounter: Payer: Self-pay | Admitting: Internal Medicine

## 2010-12-09 ENCOUNTER — Encounter: Payer: Self-pay | Admitting: Pulmonary Disease

## 2010-12-09 ENCOUNTER — Ambulatory Visit (INDEPENDENT_AMBULATORY_CARE_PROVIDER_SITE_OTHER): Payer: 59 | Admitting: Pulmonary Disease

## 2010-12-09 VITALS — BP 120/68 | HR 65 | Temp 98.2°F | Ht 71.0 in | Wt 259.6 lb

## 2010-12-09 DIAGNOSIS — G4733 Obstructive sleep apnea (adult) (pediatric): Secondary | ICD-10-CM | POA: Insufficient documentation

## 2010-12-09 NOTE — Patient Instructions (Signed)
Will get you a new cpap machine Work on weight loss followup with me in one year.   

## 2010-12-09 NOTE — Assessment & Plan Note (Signed)
The pt is doing fairly well with cpap, and is due for a new cpap machine.  He feels he is sleeping well, and denies any issues with daytime alertness.  I have stressed to him the need to keep up with mask changes and supplies, and also the need to work aggressively on weight loss.  He needs to f/u with me yearly.

## 2010-12-09 NOTE — Progress Notes (Signed)
  Subjective:    Patient ID: Chase Gutierrez, male    DOB: 10-18-1950, 60 y.o.   MRN: [id number]  HPI The pt comes in today to re-establish with a sleep md for management of osa.  He was first diagnosed in 2007 with moderate osa, and has not been seen since 2009.  He has been on cpap since initial diagnosis, and actually is doing better with the device now than when he first started.  His cpap machine is due to be replaced, but he has kept up with mask changes.  He feels he is sleeping well, has restorative sleep upon arising, and denies daytime sleepiness with inactivity.  He is on nuvigil for "metabolic syndrome".  His weight is up about 9 pounds since his last visit here.  His epworth score today is 10. Please see sleep questionnaire below for sleep hygiene history.   Review of Systems  Constitutional: Negative for fever and unexpected weight change.  HENT: Positive for ear pain. Negative for nosebleeds, congestion, sore throat, rhinorrhea, sneezing, trouble swallowing, dental problem, postnasal drip and sinus pressure.   Eyes: Negative for redness and itching.  Respiratory: Negative for cough, chest tightness, shortness of breath and wheezing.   Cardiovascular: Positive for leg swelling. Negative for palpitations.  Gastrointestinal: Negative for nausea and vomiting.  Genitourinary: Negative for dysuria.  Musculoskeletal: Negative for joint swelling.  Skin: Negative for rash.  Neurological: Negative for headaches.  Hematological: Does not bruise/bleed easily.  Psychiatric/Behavioral: Negative for dysphoric mood. The patient is not nervous/anxious.        Objective:   Physical Exam Constitutional:  Well developed, no acute distress  HENT:  Nares patent without discharge  Oropharynx without exudate, palate and uvula are elongated with crowding posteriorly  Eyes:  Perrla, eomi, no scleral icterus  Neck:  No JVD, no TMG  Cardiovascular:  Normal rate, regular rhythm, no rubs or  gallops.          Intact distal pulses  Pulmonary :  Normal breath sounds, no stridor or respiratory distress   No rales, rhonchi, or wheezing  Abdominal:  Soft, nondistended, bowel sounds present.  No tenderness noted.   Musculoskeletal:  No lower extremity edema noted.  Lymph Nodes:  No cervical lymphadenopathy noted  Skin:  No cyanosis noted  Neurologic:  Alert, appropriate, moves all 4 extremities without obvious deficit.         Assessment & Plan:

## 2010-12-16 ENCOUNTER — Other Ambulatory Visit: Payer: Self-pay | Admitting: Podiatry

## 2011-01-12 NOTE — Assessment & Plan Note (Signed)
[facility]                            CARDIOLOGY OFFICE NOTE   NAME:Chase Gutierrez, Chase Gutierrez                   MRN:          [id number]  DATE:06/28/2008                            DOB:          09-21-1950    IDENTIFICATION:  Chase Gutierrez is a 60 year old gentleman with a history  of hypertension, aortic stenosis, dyslipidemia.  Also, history of sleep  apnea.  I last saw him in October 2008.   In the interval, he said over the past month he just has not been  feeling well.  He has been fatigued.  He notes occasional dizziness.  He  has to brace himself.  He has not had any frank syncope.  Denies  palpitations.  No chest pressure.  He has cut back on his activity some,  cannot exercise like he did.   He has noted over the past few months episodes of hypoglycemia.  He has  always had a tendency towards this.  He has never checked his sugars I  think, but if he has not had a meal he will start to feel jittery,  lightheaded, he will rush to get something sugary to alleviate the  symptoms.  He notes that this is occurring more.  He was seen by a  nutritionist.  There is some concern and question whether he has iodine  deficiency.   Note, a few weeks ago he stopped taking his Simcor even though he said  he felt okay on this.   Current medicines include:  1. Wellbutrin 300.  2. Hyzaar 100/25.  3. Zyrtec p.r.n.  4. Allopurinol 300.  5. Colchicine 0.6.   PHYSICAL EXAMINATION:  GENERAL:  The patient is in no distress at rest.  VITAL SIGNS:  Blood pressure is 125/81, pulse is 82 and regular, weight  250 (note blood pressures at home have been in the 100s-120s systolic).  NECK:  JVP is normal.  LUNGS:  Clear.  No rales.  CARDIAC:  Regular rate and rhythm, grade 3/6 systolic murmur, mid  peaking, it is heard best at the left sternal border radiating to the  base.  ABDOMEN:  Benign, obese.  EXTREMITIES:  Pulses 2+.  No edema.   A 12-lead EKG shows sinus  rhythm 82 beats per minute.  ST-T wave changes  with depression and biphasic T-waves in the inferior and lateral leads.  Note, this has been present on previous EKGs.  Now it is slightly more  prominent.   Labs (off Simcor for 3 weeks) LDL of 102, HDL of 30, triglycerides 197,  cholesterol 171.  SGPT of 56.  TSH of 1.95.   IMPRESSION:  Chase Gutierrez is a 60 year old gentleman with a history of  aortic stenosis that has been moderate, now not feeling well over the  past several weeks.  Denies chest pain, just tired.  I am not convinced  that it is any signs of progression of his aortic stenosis, but he does  need an echocardiogram.  We will check a CBC, a BMET, and not a thyroid  hormone today.   Overall, he is not feeling well.  He  notes some hypoglycemic spells,  even dizzy spells.  I told him to actually back down on his Hyzaar to a  half tablet daily.  His blood pressure is lower than it has been.  Again, need to evaluate the echocardiogram.   The patient has had intermittent episodes of flushing which he has not  had in the past, not related to the Simcor.   What I would recommend is:  1. Echocardiogram.  2. Labs today.  3. I will try to refer him to Dr. Lucianne Muss for evaluation.   Again, underlying in my concern is a question of CAD.  Note, he had a  Myoview which was normal in 2007, never had a cardiac catheterization.   I will be in touch with the patient regarding test results.     Pricilla Riffle, MD, [facility]  Electronically Signed    PVR/MedQ  DD: 06/28/2008  DT: 06/29/2008  Job #: [id number]

## 2011-01-12 NOTE — Cardiovascular Report (Signed)
NAME:  Chase Gutierrez, Chase Gutierrez            ACCOUNT NO.:  [id number]   MEDICAL RECORD NO.:  [id number]          PATIENT TYPE:  OIB   LOCATION:  1961                         FACILITY:  [facility]   PHYSICIAN:  Verne Carrow, MDDATE OF BIRTH:  [birth year]   DATE OF PROCEDURE:  DATE OF DISCHARGE:  08/01/2008                            CARDIAC CATHETERIZATION   PRIMARY CARDIOLOGIST:  Pricilla Riffle, MD, [facility]   PROCEDURES PERFORMED:  1. Left heart catheterization.  2. Selective coronary angiography.  3. Left ventricular angiography.  4. Right heart catheterization.   OPERATOR:  Verne Carrow, MD   INDICATION:  Severe aortic valve stenosis by echocardiographic imaging.   DETAILS OF PROCEDURE:  The patient was brought to the outpatient cardiac  catheterization laboratory after signing informed consent for the  procedure.  The right groin was prepped and draped in a sterile fashion.  1% lidocaine was used for local anesthesia.  Using the modified  Seldinger technique, a 4-French sheath was placed into the right femoral  artery without difficulty.  A 6-French sheath was placed into the right  femoral vein without difficulty.  A right heart catheterization was then  performed with pressure measurements performed in the right atrium, the  right ventricle, pulmonary artery, and in a pulmonary capillary wedge  position.  Saturations were obtained in the right atrium and the  pulmonary artery.  Saturations were also obtained from the central  aortic system.  At this point, selective coronary angiography was  performed.  A 4-French JL5 diagnostic catheter was used to selectively  engage and inject the left coronary system.  A 3DRC 4-French catheter  was used to selectively engage and inject the right coronary artery.  A  4-French pigtail catheter was used to cross the aortic valve into the  left ventricle.  A soft-tip straight wire was advanced through the  pigtail catheter to gain access to  the left ventricle.  Following the  performance of a left ventricular angiogram, the catheter was pulled  back across the aortic valve.  There was a significant pressure gradient  noted across the valve that is outlined below.  The patient tolerated  the procedure well.  He was taken to the holding area to recover.  The  arterial and venous sheaths will be removed in the holding area.   HEMODYNAMIC FINDINGS:  1. Right heart catheterization with pressures as follows:  Right atrium 12/9, right ventricle 45/8, pulmonary artery 41/14 with a  mean of 27, pulmonary capillary wedge pressure 15/11 with a mean of 9,  central aortic pressure 128/79, left ventricular pressure 171/12, end-  diastolic pressure 13.  1. Cardiac output by Fick method 6.6, cardiac index 2.8.  2. Saturations as follows:    Central aortic 86% pulmonary artery 67%, right atrium 64%.  1. Peak-to-peak gradient across the aortic valve of 55 mmHg.  2. Mean aortic valve gradient calculated of 37 mmHg.  3. Calculated aortic valve area of 1.09.   ANGIOGRAPHIC FINDINGS:  1. The left main coronary artery bifurcates into the circumflex and      LAD and has no evidence of disease.  2. The  left anterior descending is a large vessel that courses to the      apex and gives off a large early diagonal branch, as well as 2      smaller distal branches.  There is also a large septal perforator      that arises after the first diagonal branch.  There is no      angiographic evidence of disease in this system.  3. The circumflex artery gives off 3 obtuse marginal branches and has      no evidence of disease.  4. The right coronary artery is a dominant vessel that has no      angiographic evidence of disease.  5. Left ventricular angiogram demonstrates left ventricular      hypertrophy with no evidence of wall motion abnormalities.  There      is normal left ventricular systolic function noted.  Ejection      fraction is estimated at 65%.   There is no mitral regurgitation      noted.  The aortic valve is noted to be heavily calcified.   IMPRESSION:  1. No angiographic evidence of coronary artery disease.  2. Normal left ventricular systolic function.  3. Left ventricular hypertrophy.  4. Heavily calcified aortic valve with moderately severe aortic      stenosis.  5. No evidence of mitral regurgitation.   RECOMMENDATIONS:  This patient has hemodynamic data consistent with  moderately severe aortic stenosis.  I will have him follow up with his  primary cardiologist, Dr. Dietrich Pates, to discuss future treatment  options.      Verne Carrow, MD  Electronically Signed     CM/MEDQ  D:  08/01/2008  T:  08/01/2008  Job:  [id number]   cc:   Pricilla Riffle, MD, [facility]

## 2011-01-12 NOTE — Assessment & Plan Note (Signed)
[facility]                            CARDIOLOGY OFFICE NOTE   NAME:Chase Gutierrez, Chase Gutierrez                   MRN:          [id number]  DATE:06/15/2007                            DOB:          [birth year]    IDENTIFICATION:  Chase Gutierrez is a 59 year old gentleman with a history  of hypertension, aortic stenosis and dyslipidemia, I last saw him in  October of last year.   In the interval he has been under some increased stress.  His weight was  down but now it is back up some.  He is using his CPAP a little less  often he said.   He denies chest pain, no shortness of breath.  No dizziness.   CURRENT MEDICATIONS:  1. Wellbutrin 300.  2. Hyzaar 100/25.  3. Zyrtec daily.  4. Zocor 20 daily.   PHYSICAL EXAM:  Patient is in no distress.  Blood pressure is 142/97,  pulse is 70, weight 237, down from 150 in October of last year.  LUNGS:  Clear.  CARDIAC EXAM:  Regular rate and rhythm, S1 S2, a grade 2 to 3/6 systolic  murmur heard best at the base.  ABDOMEN:  Obese, benign.  EXTREMITIES:  No edema.   A 12-lead EKG shows normal sinus rhythm 70 bpm.  T wave inversion noted  in leads V3 through V6, 1 and L.   LABS:  Lipid panel from July:  Total cholesterol of 171, LDL of 106, HDL  of 33.   Echocardiogram done today, preliminary, shows moderate to significant  LVH.  Normal LV function, actually somewhat hypercontractile.  Aortic  stenosis appears moderate with a mean gradient of about 31 mmHg.  Mild  AI.   IMPRESSION:  1. Aortic stenosis remains moderate.  I would continue to follow.  2. Hypertension.  Higher today than I would like.  He says it is      usually running lower.  Again, I have asked him to check the cuffs      that they are checking his blood pressure with at work to make sure      they are accurate.  I have checked it again myself today and it is      in the 140/100 range.  His blood pressure may reflect not using the      CPAP all  the time and I encouraged him to get back on it.  If his      pressure is still running high he should call, continue on regimen      for now.  3. Dyslipidemia.  His HDL should increase.  I encouraged him to      continue weight loss.  Encouraged him to increase his activity and      will follow up.  4. Sleep apnea.  We will schedule follow up with Pulmonary.  5. Gout.  Patient has had a few flares of gout in his feet with      injection.  He was told by the podiatrist that allopurinol causes      gout attacks which, again, I do not agree with.  Will set him up      for a prescription for this.  Again, not to use during an acute      flare.   I will set to see the patient back tentatively in 9 months' time, sooner  if problems develop.     Pricilla Riffle, MD, [facility]  Electronically Signed    PVR/MedQ  DD: 06/15/2007  DT: 06/16/2007  Job #: [id number]   cc:   Dr. Clelia Croft

## 2011-01-12 NOTE — Assessment & Plan Note (Signed)
[facility]                            CARDIOLOGY OFFICE NOTE   NAME:Chase Gutierrez                   MRN:          [id number]  DATE:10/28/2008                            DOB:          Oct 13, 1950    IDENTIFICATION:  Chase Gutierrez is a 60 year old gentleman who I follow  with a history of aortic stenosis.   Since I saw him last, he has undergone aortic valve replacement at the  [facility] by Dr. Julianne Rice.  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  He had some  fluid overload post surgery, atelectasis, junctional rhythm, and this  was treated.  Note, postoperatively, he had some elevation of his LFTs,  so statin therapy was held.   The patient has been seen once by Dr. Sherryll Burger and returns today for  continued followup.   Postoperatively, he has done quite well.  He denies significant pain  taking a rare pain medicine.  No fevers, chills, and cough.  Swelling  has gone down.  He denies dizziness.  Eager to increase his activity  some.   CURRENT MEDICATIONS:  His current medicines include multivitamin daily,  omeprazole 20 daily, docusate sodium 100, aspirin 81, Lasix 20 daily,  Wellbutrin, losartan 50, potassium 20 mEq daily, and metoprolol 25  b.i.d.   PHYSICAL EXAMINATION:  GENERAL:  On exam, the patient is in no distress  at rest.  VITAL SIGNS:  Blood pressure left arm 140/90, on my check right arm  138/90, left arm 144/94, pulse 80 and regular, weight 242 which is down  from 260 prior to surgery in December 2009.  NECK:  JVP is normal.  LUNGS:  Clear.  No rales or wheezes.  CARDIAC:  Regular rate and rhythm, S1 and S2.  No significant murmurs.  CHEST:  Scar is healing well.  ABDOMEN:  No hepatomegaly.  Supple.  EXTREMITIES:  No edema.   A 12-lead EKG shows normal sinus rhythm, 79 beats per minute, LVH with  strain pattern.   IMPRESSION:  1. Status post aortic valve replacement.  He  is now at 2 weeks and is      doing quite well with a minimally invasive approach.  Valve sounds      like it is a good fit for him.  I do not hear any significant      murmurs.  At some point, he will get an echocardiogram to define      his baseline parameters.  Over time, I would follow this to see if      there is any regression in the LVH.  For now, I think he can      increase his activity some.  He would like to walk around the      block.  I told him to take it slowly.  He is going to the beach,      which I think is good.  I would check labs today, but with his      fluid status, I think he can stop the Lasix and potassium.  2. Hypertension.  This will need to be followed.  He brought a cuff      and that will need to be calibrated.  He may need to go up on      losartan, but I do not want to make him dizzy now.  3. Dyslipidemia.  He has Simcor to take, and I will see what his LFTs      are before telling him to start it again.   I will submit his information for cardiac rehab.  He can begin this at  about a month's time.  If he has any problems, he should call.     Pricilla Riffle, MD, [facility]  Electronically Signed    PVR/MedQ  DD: 10/28/2008  DT: 10/29/2008  Job #: [id number]   cc:   Titus Dubin. Alwyn Ren, MD,FACP,FCCP

## 2011-01-15 NOTE — Assessment & Plan Note (Signed)
[facility]                              CARDIOLOGY OFFICE NOTE   NAME:Gutierrez Gutierrez MOLL                   MRN:          [id number]  DATE:04/15/2006                            DOB:          [birth year]    IDENTIFICATION:  Gutierrez Gutierrez is a 59 year old gentleman with a history of  aortic stenosis, hypertension.  He was last seen in Cardiology Clinic back  in January.   NOTE:  A few weeks ago he was at home, it was a Sunday night about 8  o'clock.  He had had dinner (steak).  He was sitting, talking on the phone  on the floor, when he began to feel tingling at the top of his head,  spreading down his face, bilateral arms.  Hung up the phone, felt like he  was going to throw up, possibly pass out, crawled to the bathroom, unable to  throw up, laid down.  About an hour later he felt some better.  He went to  the Urgent Care the next day, related the story.  His EKG had some subtle  changes and he was referred to [facility], and admitted.  Ruled out, was  discharged, and set up for adenosine Myoview and an echocardiogram as well  as carotid Dopplers.   Since the episode, after the patient was discharged, went back to the Urgent  Care feeling again and complaining of sinus pressure, treated with an  antibiotic injection for possible sinus infection.   Since all this began, he has not had any more spells like the day of  admission.  He still just feels weak.  Still having sinus pressure.   Otherwise, he says his blood pressure has been much better now that he is on  CPAP.  Energy level was better up until this.  He has not done a lot of  walking.   CURRENT MEDICATIONS:  1. Wellbutrin 300 mg daily.  2. Aspirin 325 mg daily.  3. Fish oil 2 daily.  4. Hyzaar 100/25 mg daily.   REVIEW OF SYSTEMS:  Some swelling on pedal surfaces of feet.   PHYSICAL EXAMINATION:  GENERAL:  The patient is in no distress.  VITAL SIGNS:  Blood pressure is 127/84, pulse is  86, weight 240.  HEENT:  Positive sinus pressure with palpation of the maxillary sinuses.  Ears:  Tympanic membranes are normal.  There is slight erythema along the  edge of the tympanic membrane.  NECK:  No JVD.  LUNGS:  Clear.  CARDIAC:  Grade 2-3/6 systolic murmur, heard best at the base.  Normal S1,  S2, no S3.  ABDOMEN:  Benign.  EXTREMITIES:  No edema in the ankles or dorsum of the feet.  Question of  slight edema on the pads of the feet.   Adenosine Myoview, no evidence for inducible ischemia or infarct.  EF of  61%.  Carotid Doppler showed normal carotid and vertebral arteries  bilaterally.   Echocardiogram done at [facility].  LV is hyperdynamic.  The aortic valve  with moderate aortic stenosis, slightly increased from previous by report,  though no gradients  were reported.   IMPRESSION:  The patient is a 60 year old with aortic stenosis,  hypertension, sleep apnea (he uses CPAP), with a spell.  Workup is negative,  question if related to sinuses.  I have talked to Dr. Timoteo Gaul at [facility], and he will be in touch with the patient regarding further ENT  followup.   For now, I told the patient to continue his medicines.  I would not change  anything for now.  I encouraged him to increase his walking.  Review of his  lipids, his HDL is a little low at 31, LDL 116.  Again, I think if he pulled  his weight down this would improve.  Blood pressure needs and sleep apnea  may improve as well.   Tentatively I will put followup for some time in the winter, sooner if  problems develop.                                Pricilla Riffle, MD, [facility]    PVR/MedQ  DD:  04/15/2006  DT:  04/16/2006  Job #:  [id number]

## 2011-01-15 NOTE — H&P (Signed)
NAME:  Chase Gutierrez, Chase Gutierrez            ACCOUNT NO.:  [id number]   MEDICAL RECORD NO.:  [id number]          PATIENT TYPE:  EMS   LOCATION:  MAJO                         FACILITY:  [facility]   PHYSICIAN:  Ok Anis, NPDATE OF BIRTH:  12-Dec-1950   DATE OF ADMISSION:  03/28/2006  DATE OF DISCHARGE:                                HISTORY & PHYSICAL   PRIMARY CARE PHYSICIAN:  Dr. Alwyn Ren.   PRIMARY CARDIOLOGIST:  Dr. Dietrich Pates.   PULMONOLOGIST:  Dr. Marcelyn Bruins.   PATIENT PROFILE:  60-year white male in no prior history of CAD with prior  mild AS and AI who presents with presyncope.   PROBLEMS:  1.  Presyncope.  2.  Mild AS/AI      1.  08/12/2003 echocardiogram, ejection fraction normal, moderate          concentric LVH, mild AS and AI, trace TR.  3.  Negative exercise Myoview in June 2001.  4.  Hypertension.  5.  Obstructive sleep apnea, mild to moderate now on CPAP.  6.  Status post nasal septal reconstruction surgery.  7.  History nephrolithiasis.  8.  Hyperlipidemia.   HISTORY OF PRESENT ILLNESS:  A 60 year old white male with history of mild  AS and AI and hypertension followed by Dr. Tenny Craw.  He was in his usual state  of health until last night when he was on the phone with a friend and had  sudden onset of lightheadedness and dizziness with a sensation that the room  was spinning as well as feeling very hot and diaphoretic as well as  nauseated and felt like he might pass out.  He hung up the phone and walked  to the bathroom where he experienced dry heaves but not any actual vomiting  and after approximately 10 minutes of dry heaving, he felt better with the  exception that he remained weak for about another hour.  He is well slept  well last night and after getting up this morning just felt off.  He went  to Urgent Care where ECG was performed revealing T-wave inversions lead I,  aVL, V3-V6 and decision was made to transfer to the ED for further  evaluation.  He  denies any chest pain or shortness of breath either at this  moment or during this episode of lightheadedness last night.  He denies any  palpitations.   ALLERGIES:  NO KNOWN DRUG ALLERGIES.   HOME MEDICATIONS:  Wellbutrin 300 mg q.d., Hyzaar 100/25 mg q.d.   FAMILY HISTORY:  Mother is age 58 with coronary artery disease and  hypertension.  Father is age 90 with CAD and diabetes.  He has three  brothers who are alive and well.   SOCIAL HISTORY:  Lives in [place] by himself.  He is an attorney, he has  a 74 year old daughter that spends half of her time with him and half with  his ex-wife.  He never smoked cigarettes, has 2-3 drinks alcoholic beverages  per week.  Denies any drugs.  Does not routinely exercise.   REVIEW OF SYSTEMS:  Positive for presyncope, dizziness, nausea with dry  heaving as well as diaphoresis as explained in the HPI.  Denies any PND,  orthopnea, dizziness, syncope, edema or early satiety.   PHYSICAL EXAM:  Temperature 97.0, heart rate 72, respirations 16, blood  pressure 129/77, pulse ox 98% room air.  Pleasant white male in no acute distress.  Awake, alert x3.  NECK:  Normal carotid upstrokes.  No bruits or JVD.  LUNGS:  Respirations were unlabored.  Clear to auscultation.  CARDIAC: Regular S1.  Unable to appreciate an S2.  No S3-S4.  He has got a  3/6 systolic ejection murmur loudest at the right upper sternal border and  heard throughout.  ABDOMEN:  Soft, nontender, nondistended, bowel sounds present x4.  EXTREMITIES:  Warm, dry and pink.  No clubbing, cyanosis or edema.  Dorsalis  pedis and posterior tibial pulse 2+ and equal bilaterally.   Chest x-ray is pending.  EKG shows sinus rhythm with a rate of 67 beats per  minute. He is a normal axis. He has T-wave inversion in `1, aVL, V3-V6 and  this is unchanged compared to old EKGs.  All other lab work is pending.   ASSESSMENT AND PLAN:  1 - Presyncope/aortic stenosis.  The patient has  history of  aortic stenosis and we have to question if his symptoms last  night were secondary to this.  He denies any history of the exertion or  other events that might cause vasodilatation prior to his episode although  he reports that he did take his Hyzaar last night rather than in the  morning, probably that 30-40 minutes prior to symptom onset.  He has had no  recurrence of symptoms but still feels a little bit weak.  His ECG shows old  T-wave inversions anterolaterally.  Will plan to admit and cycle enzymes as  well as obtain echocardiogram to reevaluate aortic stenosis as well as  carotid Dopplers.  If workup is negative, plan to obtain outpatient Myoview.  2 - Hypertension, stable.  Will check orthostatic.  3 - Hyperlipidemia.  Check lipids and LFTs, would have a low threshold  __________ statin, given AS.  4 - Depression.  Continue Wellbutrin.  5 - Obesity.  The patient benefit from exercise regimen.      Ok Anis, NP     CRB/MEDQ  D:  03/28/2006  T:  03/28/2006  Job:  [id number]

## 2011-01-15 NOTE — Procedures (Signed)
NAME:  PARKER, WHERLEY NO.:  [id number]   MEDICAL RECORD NO.:  [id number]          PATIENT TYPE:  OUT   LOCATION:  SLEEP CENTER                 FACILITY[facility]  [facility]   PHYSICIAN:  Marcelyn Bruins, M.D. [facility] DATE OF BIRTH:  [birth year]   DATE OF STUDY:  09/29/2005                              NOCTURNAL POLYSOMNOGRAM   REFERRING PHYSICIAN:  Dr. Dietrich Pates   DATE OF STUDY:  September 29, 2005   INDICATION FOR STUDY:  Hypersomnia with sleep apnea.   EPWORTH SCORE:  9   SLEEP ARCHITECTURE:  The patient had a total sleep time of 338 minutes with  adequate REM and never achieved slow wave sleep. Sleep onset latency was  normal as was REM onset. Sleep efficiency was 92%.   RESPIRATORY DATA:  The patient underwent split-night study where he was  found to have 41 obstructive events in the first 128 minutes of sleep. This  gave him a Respiratory Disturbance Index of 19 events per hour. The events  were not positional but were clearly worse during REM. There was severe  snoring noted. By protocol the patient was placed on a medium ResMed full  face mask and CPAP titration was initiated. The patient seemed to have  excellent control of his obstructive events at a pressure between 14 and 16  cm, however, the pressure was transiently increased to 17 cm because of some  mild breakthrough snoring. At that pressure however central events began to  occur indicative of overtitration.   OXYGEN DATA:  There was O2 desaturation as low as 77% with REM related  events.   CARDIAC DATA:  No clinically significant cardiac arrhythmias.   MOVEMENT/PARASOMNIA:  Small numbers of leg jerks with no clinically  significant sleep disruption.   IMPRESSION/RECOMMENDATION:  Mild to moderate obstructive sleep apnea with an  extrapolated Respiratory Disturbance Index of 19 events per hour and O2  desaturation as low as 77%. The patient was then placed on continuous  positive airway pressure with a medium  ResMed full face mask, and seemed to  have the best control at a  level of 14-16 cm H2O pressure. I would recommend an initial setting of 14  cm. Concomitant weight loss is also strongly encouraged.           ______________________________  Marcelyn Bruins, M.D. [facility]  Diplomate, American Board of Sleep  Medicine     KC/MEDQ  D:  10/01/2005 12:13:21  T:  10/01/2005 20:46:00  Job:  [id number]

## 2011-01-15 NOTE — Assessment & Plan Note (Signed)
[facility]                              CARDIOLOGY OFFICE NOTE   NAME:Chase Gutierrez, Chase Gutierrez                   MRN:          [id number]  DATE:06/16/2006                            DOB:          1950/11/14    IDENTIFICATION:  Chase Gutierrez is a 60 year old gentleman whom I follow in  the cardiology clinic.  History of hypertension, aortic stenosis (moderate),  dyslipidemia (mild).  I saw him at the end of August.   When I saw him last, he was wheezing and had what sounds like an upper  respiratory infection.  I placed him on a Z-Pak.  He has since been seen by  Coralyn Helling, and has been discontinued from the inhalers.  He has also been  seen by Marcelyn Bruins.  He comes in today as a routine followup.  He says he  is just lacking energy.  He is sitting and tired.  He does not want to do  things.  It almost sounds like what he was like before he started CPAP.  When he started CPAP, he transiently got better, and then he had his episode  during the summer of near pre-syncopal spell, but has not felt well  completely since.   CURRENT MEDICATIONS:  1. Wellbutrin 300 q. day.  2. Hyzaar 125 q. day.  3. Zyrtec q. day.   PHYSICAL EXAM:  On exam the patient is in no distress.  Blood pressure 144/82, pulse is 85, weight 250.  LUNGS:  Clear with no wheezes or rales.  CARDIAC:  Grade 2-3/6 systolic murmur heard best at the base.  Normal S1 and  S2.  ABDOMEN:  Obese, benign.  No hepatomegaly.  EXTREMITIES:  No edema.   IMPRESSION:  1. Fatigability.  I cannot explain this from a cardiac standpoint.  I have      reviewed the echo today.  I think his aortic stenosis is moderate.  He      has a hyperdynamic left ventricle but I do not think the story he is      telling me goes along with cardiac decompensation.  He will be fatigued      just sitting.  Question if it is related to his continuous positive      airway pressure.  I will be in touch with Marcelyn Bruins  and then back in      touch with him.  Question cardiopulmonary stress testing may be      helpful.  2. Aortic stenosis, see above.  Would also recommend a trial of a Statin,      he had not tolerated Lipitor or Crestor in the past, to see if possible      slowing progression.  Will try Zocor 10 mg q. day and followup.  3. Hypertension, adequate control.  4. Health care maintenance.  Encouraged him, once he is feeling better      again, to increase his activity and to try to pull his weight down.   I will be in touch with him once I have spoken with Dr. Shelle Iron.  Continue  current regimen  for now.    ______________________________  Pricilla Riffle, MD, [facility]    PVR/MedQ  DD: 06/16/2006  DT: 06/19/2006  Job #: [id number]   cc:   Barbaraann Share, MD,FCCP

## 2011-01-15 NOTE — Assessment & Plan Note (Signed)
HEALTHCARE                               PULMONARY OFFICE NOTE   NAME:Chase Gutierrez, Chase Gutierrez                   MRN:          [id number]  DATE:05/05/2006                            DOB:          Sep 17, 1950    REFERRING PHYSICIAN:  Pricilla Riffle, MD, [facility]   HISTORY OF PRESENT ILLNESS:  I had the pleasure of meeting Chase Gutierrez  today for evaluation of his possible asthma.  He normally sees Dr. Tenny Craw for  his hypertension and moderate aortic stenosis.  Additionally, he has a  history of sleep apnea and he sees Dr. Marcelyn Bruins for this.  He apparently  had an episode approximately two weeks ago in which he developed chest pain  which was associated with nausea and a feeling like he was going to pass  out.  He also had complaints of headache with sinus pressure.  He had gone  to a Urgent Care Center and was told that he had wheezing as well as sinus  infection and was given a steroid shot and started on Zithromax.  He also  was given a nebulizer treatment at the Urgent Care Center, but got no  significant difference from that.  He was subsequently changed to Augmentin  and started on Asmanex and albuterol as well as using Nasonex.  He said that  he had not noticed any real difference from using the Asmanex or the  albuterol.  He also used Afrin for about three days which he said helped to  improve some of his nasal symptoms.  He said that he has never had any  history of asthma from before.  He said he gets an occasional feeling of  chest tightness and sometimes he feels like he cannot get enough air in, but  does not have any symptoms of shortness of breath with exertion.  He does  not have any symptoms of coughing or sputum production or hemoptysis.  He  denies having any abdominal pain, nausea, vomiting, fevers, sweats, chills,  weight loss or leg swelling.  There is no history of tobacco exposure or  occupational exposure.  He does not have any  exposure to animals or birds.  There is no significant travel history.  He has not had any recent sick  exposure.  He did have some difficulty with the use of his CPAP machine  recently in which he was unsure of the proper use of his equipment, and as a  result was placing his cochlear actually inside of the tubing for his  machine which may have contributed to some degree with his suboptimal CPAP  therapy.  Additionally, he said that he has not changed his water for 3-4  days for his humidifier for his CPAP machine.  He is currently using nasal  irrigation.   PAST MEDICAL HISTORY:  1. Hypertension.  2. Moderate aortic stenosis.  3. Seasonal allergies.  4. Chronic sinuses.  5. Nephrolithiasis.  6. Obstructive sleep apnea.  7. Depression.   PAST SURGICAL HISTORY:  1. Sinus surgery.  2. Tonsillectomy.   MEDICATIONS:  1. Wellbutrin 300 mg  daily.  2. Hyzaar 125 mg daily.  3. Zyrtec 10 mg daily.  4. Nasonex one spray each nostril b.i.d.  5. Asmanex two puffs b.i.d.  6. Albuterol p.r.n.   ALLERGIES:  No known drug allergies.   FAMILY HISTORY:  Significant for father who had diabetes, heart disease and  possibly sleep apnea.  His mother had heart disease.   SOCIAL HISTORY:  He is divorced.  He lives with his daughter.  He works as  an Pensions consultant.  There is no history of tobacco use.  He has only occasional  alcohol use.   REVIEW OF SYSTEMS:  He says that he occasionally gets swelling in his legs  and occasionally says he has an extra heart beat, but otherwise, his  symptoms are negative.   PHYSICAL EXAMINATION:  VITAL SIGNS:  He is 5 feet, 11 inches tall, weight  240 pounds, temperature 97.8, blood pressure 102/68, heart rate 83, oxygen  saturation is 96% on room air.  HEENT:  Pupils reactive.  He had mild sinus tenderness over the maxillary  sinuses.  There is boggy nasal mucosa with a clear nasal discharge.  There  was no oral lesions.  No lymphadenopathy or thyromegaly.   Permanent S1, S2  with a 2/6 systolic murmur.  CHEST:  Clear to auscultation.  ABDOMEN:  Obese, soft and tender.  EXTREMITIES:  No edema, cyanosis or clubbing.  NEUROLOGICAL:  Alert and oriented x3.  Power is 5/5 strength, and no  cerebral deficits were appreciated.   STUDIES:  Chest x-ray performed in the office today did not show any acute  cardiopulmonary process.  Spirometry in the office today showed FEV1 of 3.86  liters which was 98% of predicted and FEC of 5.30 liters which was 1.09% of  predicted and an FE1 to [facility] ratio of 73% with an essentially normal  appearing flow volume loop.   IMPRESSION:  I am not certain whether he actually has underlying asthma.  If  anything, he may have had reactive airway disease likely related to  sinusitis with some postnasal drip.  At this time, I have advised him to  stop using the Asmanex and Albuterol.  I will have him continue  to use his  Nasonex as well as his nasal irrigation regimen.  He is using Nasonex two  sprays in each nostril once a day.  I will also give him a prescription for  Astelin which he can use one spray in each nostril b.i.d., and advised him  to stop using the Zyrtec as this seems to cause him some symptoms of  sleepiness.  I have also advised him to limit the use of his Afrin, as this  can sometimes cause rebound nasal congestion.  With regards to his CPAP, he  is currently getting adjusted to the use of his new medications, and he has  been instructed in the proper use of his filters.  I have also advised him  to change his water and his humidifier on a more regular basis as this may  act as a source for mold and possible irritation to his sinuses.  I will  make arrangements for him to have further follow up with Dr. Shelle Iron in 3-4  months.  However, I have  advised him that if his symptoms do not improve or if they worsen in any  way, he should call our office for earlier evaluation.  Coralyn Helling, MD   VS/MedQ  DD:  05/05/2006  DT:  05/06/2006  Job #:  [id number]   cc:   Pricilla Riffle, MD, [facility]

## 2011-01-15 NOTE — Assessment & Plan Note (Signed)
[facility]                              CARDIOLOGY OFFICE NOTE   NAME:Sweetland, LANDAN FEDIE                   MRN:          [id number]  DATE:04/29/2006                            DOB:          1950/09/27    IDENTIFICATION:  Mr. Ewings is a 60 year old gentleman with a history of  moderate aortic stenosis, hypertension, dyslipidemia (mild).  I last saw him  back earlier this month.  When I saw him last, he had recently been admitted  to [facility]  because of some lightheadedness/presyncope.  Work-up  was negative.  He was discharged. Had a Myoview scan done which was normal.  Echocardiogram was done actually at [facility]  that showed  moderate aortic stenosis, slight regression from previous.   The patient, when I saw him, said he would have a followup for a question  sinus infection as this may have been an etiology of his unsteadiness.  Today he went to [facility].  There he was seen by a PA or a  physician.  I got a call from them actually.  The patient complaining of  some chest tightness over the past few days.  Also noted that he had some  wheezing.  He thought the wheezing was secondary to sinus infection with  reactive airway response and was going to give him some steroids and  antibiotics.  I told them to send the patient on here for further  evaluation.   Note a chest x-ray done which showed no acute disease, tortuous aorta,  previously commented on.  The patient also had an EKG done.  This showed  sinus rhythm., 87 beats per minute.  Note there was ST depression  in leads  II, V4-V6 and I. I do not have old EKGs to compare at this time.  Reviews of  his EKGs with his chart available, he had some of these changes though they  are intermittently over the past couple of years though they are more  prominent on this EKG.  He did develop some marked changes with his stress  test in the past.   On talking  with the patient today, he says Monday he noticed some chest  tightness across his chest. It occurred usually without activity.  He denied  any wheezing. No cough.  He still felt somewhat lightheaded. This continued  on Tuesday, Wednesday, Thursday.  It did not keep him up at night.  He went  on to the Urgent Care today.   CURRENT MEDICATIONS:  1. Wellbutrin XL 300 mg daily.  2. Aspirin 325 mg daily.  3. Fish oil 2 g daily.  4. Hyzaar 100/25 mg daily.   PHYSICAL EXAMINATION:  GENERAL:  The patient is in no acute distress.  VITAL SIGNS:  Blood pressure 132/94, pulse 87, weight 241, stable from last  visit.  LUNGS:  Wheezes on forced expiration; otherwise clear, moving air.  CARDIAC:  Regular rate and rhythm.  Grade 3/6 systolic murmur heard best at  the left sternal border radiating up to the base.  ABDOMEN:  Benign.  EXTREMITIES:  No edema.   A 12-lead EKG as noted above showed sinus rhythm 87 beats per minute.  ST  depression 0.5 to 1 mm in V4 through V6 two, one.   IMPRESSION:  Mr. Piascik is a 60 year old gentleman recently admitted to  [facility]  for presyncopal-type spells.  Work-up showed again  moderate aortic stenosis.  Myoview scan was normal.  He did  have some  dizziness and there was concern he may have a sinus issue going on.  Went to  see.  This has actually continued since I saw him a few weeks ago but over  the past few days has had chest tightness.   Indeed, he is wheezing on exam which I did not appreciate when I saw him in  the past.  I agree with steroids he had gotten as an injection and  antibiotics.  He has had a Z-Pak in the past though it has not helped.  I  went and switched this over to Augmentin for a 10-day course.  I have also  given him some inhaled steroids and nasal steroids as well.   He has followup with Marcelyn Bruins at the end of September but we have pulled  this back to mid week next week.   I will be in touch with him over the  next several days to see how he is  doing.   Again his EKG has some changes consistent with strain but he has had these  before and with the recent negative testing and the mild wheezing, I would  try treating pulmonary infection source first before proceeding with further  cardiac evaluation.                                Pricilla Riffle, MD, [facility]    PVR/MedQ  DD:  04/29/2006  DT:  04/30/2006  Job #:  [id number]   cc:   Barbaraann Share, MD, [facility]

## 2011-01-15 NOTE — Discharge Summary (Signed)
NAME:  Chase Gutierrez, Chase Gutierrez            ACCOUNT NO.:  [id number]   MEDICAL RECORD NO.:  [id number]          PATIENT TYPE:  INP   LOCATION:  3705                         FACILITY:  [facility]   PHYSICIAN:  Charlton Haws, M.D.     DATE OF BIRTH:  Apr 09, 1951   DATE OF ADMISSION:  03/28/2006  DATE OF DISCHARGE:  03/29/2006                           DISCHARGE SUMMARY - REFERRING   DISCHARGE DIAGNOSES:  1.  Near syncope of uncertain etiology.  2.  Elevated hemoglobin of uncertain etiology.  3.  History as previously.   SUMMARY OF HISTORY:  Chase Gutierrez is a 60 year old white male who presents  to the emergency room with near syncope.  He stated that while talking on  the phone with a friend, he suddenly developed lightheadedness, dizziness,  with a sensation that the room was spinning.  He also became very hot,  diaphoretic, as well as nauseated, and felt like he may pass out.  He  experienced dry heaves and felt better after about another 10 minutes, but  remained weak for an hour.  He slept well, and on the morning of  presentation he stated he felt just off.  He went to Urgent Care.  An EKG  showed some T-wave inversions; thus, he was referred for further evaluation.   PAST MEDICAL HISTORY:  1.  Mild AS and AI.  2.  Hypertension.  3.  Obstructive sleep apnea on CPAP.  4.  Negative Myoview in June 2001.  5.  Nephrolithiasis.  6.  Hyperlipidemia.   LABORATORY DATA:  Orthostatic blood pressures did not show any changes.  H&H  of 17.1 and 50.2.  Normal indices.  Platelets 249, WBCs 8.6.  PTT 61, PT  13.9.  Sodium 140, potassium 3.2, BUN 24, creatinine 1.2.  Normal LFTs.  CK-  MB and troponin were negative x1.  Fasting lipids - total cholesterol 178,  triglycerides 153, HDL 31, LDL 116.  TSH 1.325.   CHEST X-RAY:  Slightly tortuous aorta.  No infiltrate or signs of congestive  heart failure.   HOSPITAL COURSE:  Chase Gutierrez was admitted to [facility].  Overnight, he did not have any  further problems.  Dr. Eden Emms, after review  of his EKGs which showed normal sinus rhythm, nonspecific T-wave changes  that were unchanged from prior EKGs, and echocardiogram, felt that the  patient could be discharged home.   DISPOSITION:  The patient is discharged home.  Asked to maintain a low salt,  fat and cholesterol diet.  His activities are not restricted.  He was asked  to bring all medications to all follow-up appointments.  He will have  carotid Dopplers on April 11, 2006 at 9:30, followed by an adenosine  Myoview on April 11, 2006 at 10:00 a.m. by Dr. Charlott Rakes office.  He was then  follow up with Dr. Tenny Craw on April 15, 2004 at 3:15.  Per Dr. Fabio Bering  discharge note, consideration should be given to a hematology evaluation,  given his elevated hemoglobin and his family history of polycythemia vera.  He was asked to arrange follow-up with Dr. Alwyn Ren in regards to this and  Dr.  Shelle Iron as needed.   DISCHARGE TIME:  Greater than 30 minutes.   DISCHARGE DIAGNOSIS:  As first and then dictation     ______________________________  Olga Millers, M.D. [facility]    ______________________________  Charlton Haws, M.D.    BC/MEDQ  D:  03/29/2006  T:  03/29/2006  Job:  [id number]   cc:   Titus Dubin. Alwyn Ren, M.D. [facility]  [phone]  [place]  [place] [address]   Pricilla Riffle, M.D.  [address]  [address]  [place]  [place] [address]   Marcelyn Bruins, M.D. [facility]  [address]  [place]  [place] [address]

## 2011-06-01 LAB — CREATININE, SERUM: GFR calc non Af Amer: >60

## 2011-06-04 LAB — POCT I-STAT 3, VENOUS BLOOD GAS (G3P V)
Acid-Base Excess: 1 mmol/L (ref 0.0–2.0)
Acid-Base Excess: 1 mmol/L (ref 0.0–2.0)
Bicarbonate: 28 mEq/L — ABNORMAL HIGH (ref 20.0–24.0)
O2 Saturation: 64 %
TCO2: 30 mmol/L (ref 0–100)

## 2011-06-04 LAB — POCT I-STAT 3, ART BLOOD GAS (G3+)
Bicarbonate: 26.2 mEq/L — ABNORMAL HIGH (ref 20.0–24.0)
pH, Arterial: 7.35 (ref 7.350–7.450)
pO2, Arterial: 55 mmHg — ABNORMAL LOW (ref 80.0–100.0)

## 2011-07-08 ENCOUNTER — Telehealth: Payer: Self-pay | Admitting: Internal Medicine

## 2011-07-08 NOTE — Telephone Encounter (Signed)
Will review with Dr. Ross.  

## 2011-07-08 NOTE — Telephone Encounter (Signed)
New message;  Pt called and had a spell last night with right chest/arm pain.  Ok today.  He feels ok except for being tired.  He took a BC powder, Zantac and a Mobic and pain went away.  Please call work number and ask for him and if no answer call cell [phone].

## 2011-07-08 NOTE — Telephone Encounter (Signed)
Dr.Ross called patient today. Will refer to [facility] for numbness in feet. Had lab work at primary care Dr.Shaw [phone]. Will call to get copy of labs.

## 2011-07-12 ENCOUNTER — Encounter: Payer: Self-pay | Admitting: Internal Medicine

## 2011-07-30 ENCOUNTER — Telehealth: Payer: Self-pay | Admitting: *Deleted

## 2011-07-30 NOTE — Telephone Encounter (Signed)
Diane from Dr. Imagene Gurney office tried to contact [name] to set up an appt. Patient has not returned her call. I contacted [name] and left him a message to call Diane at [facility] [phone].

## 2011-09-13 ENCOUNTER — Other Ambulatory Visit: Payer: Self-pay | Admitting: *Deleted

## 2011-09-13 DIAGNOSIS — R200 Anesthesia of skin: Secondary | ICD-10-CM

## 2011-09-30 ENCOUNTER — Encounter (INDEPENDENT_AMBULATORY_CARE_PROVIDER_SITE_OTHER): Payer: 59 | Admitting: Cardiology

## 2011-09-30 DIAGNOSIS — R200 Anesthesia of skin: Secondary | ICD-10-CM

## 2011-09-30 DIAGNOSIS — I739 Peripheral vascular disease, unspecified: Secondary | ICD-10-CM

## 2011-10-04 ENCOUNTER — Encounter: Payer: Self-pay | Admitting: Internal Medicine

## 2011-10-04 ENCOUNTER — Ambulatory Visit (INDEPENDENT_AMBULATORY_CARE_PROVIDER_SITE_OTHER): Payer: 59 | Admitting: Internal Medicine

## 2011-10-04 VITALS — BP 172/110 | HR 73 | Ht 71.0 in | Wt 262.4 lb

## 2011-10-04 DIAGNOSIS — I359 Nonrheumatic aortic valve disorder, unspecified: Secondary | ICD-10-CM

## 2011-10-04 DIAGNOSIS — E663 Overweight: Secondary | ICD-10-CM

## 2011-10-04 DIAGNOSIS — E785 Hyperlipidemia, unspecified: Secondary | ICD-10-CM

## 2011-10-04 DIAGNOSIS — I10 Essential (primary) hypertension: Secondary | ICD-10-CM

## 2011-10-04 DIAGNOSIS — G4733 Obstructive sleep apnea (adult) (pediatric): Secondary | ICD-10-CM

## 2011-10-12 NOTE — Progress Notes (Addendum)
HPI Patient is a 61 year old with a history of aortic stenosis (s/p AVR in 2010), HTN, diastolic CHF, obesity, sleep apnea (uses CPAP).  I saw him in  Feb 2011. Since seen he has done fair form a cardiac standpoint.  Breathing has been fair.  Does give out sooner than he thinks he should.  Takes as tolrated.  Stress makes it worse  No dizziness.  He still does not have energy he thinks he should.  Having problems losing wt.  Has had problems with gout and kidney stones.  Seen in neuro for foot numbness.  ABIs normal Claims BP is running pretty good.  But very stressful at work today before getting here.  Common problem  BP runs higher at these times.  Gets fatigued.  No Known Allergies  Current Outpatient Prescriptions  Medication Sig Dispense Refill  . Armodafinil (NUVIGIL) 150 MG tablet Take 150 mg by mouth daily.        Marland Kitchen buPROPion (WELLBUTRIN XL) 300 MG 24 hr tablet Take 300 mg by mouth daily.        . colchicine 0.6 MG tablet Take 0.6 mg by mouth 3 (three) times daily.        . diphenhydramine-acetaminophen (TYLENOL PM) 25-500 MG TABS Take 1 tablet by mouth at bedtime as needed.        . Exenatide (BYETTA 10 MCG PEN [place]) Inject 1 pen into the skin 2 (two) times daily.      . hydrochlorothiazide (HYDRODIURIL) 12.5 MG tablet Take 12.5 mg by mouth daily.        Marland Kitchen ibuprofen (ADVIL,MOTRIN) 400 MG tablet Take 400 mg by mouth as needed.        Marland Kitchen levothyroxine (SYNTHROID) 25 MCG tablet Take 25 mcg by mouth daily.        . nebivolol (BYSTOLIC) 5 MG tablet 1/3 of a tab by mouth once daily       . Multiple Vitamin (MULTIVITAMIN) capsule Take 1 capsule by mouth daily.        . Testosterone (ANDROGEL) 25 MG/2.5GM GEL As directed         Past Medical History  Diagnosis Date  . Depressed   . Allergic rhinitis   . Nephrolithiasis   . Dyslipidemia   . Aortic stenosis   . Hypertension   . OSA (obstructive sleep apnea)   . Sinus complaint   . Obesity     Past Surgical History  Procedure Date  .  Tonsillectomy 1982, 1993  . Aortic valve replacement 10/15/2008  . Hemorrhoid surgery 1990    Family History  Problem Relation Age of Onset  . Coronary artery disease Mother   . Diabetes Father   . Coronary artery disease Father   . Lung cancer Father     History   Social History  . Marital Status: Divorced    Spouse Name: N/A    Number of Children: 1  . Years of Education: N/A   Occupational History  . attorney [employer]   Social History Main Topics  . Smoking status: Never Smoker   . Smokeless tobacco: Never Used   Comment: only smoked very rarely!   . Alcohol Use: Yes     rare  . Drug Use: Not on file  . Sexually Active: Not on file   Other Topics Concern  . Not on file   Social History Narrative   Lives with daughter.    Review of Systems:  All systems reviewed.  They are negative to the above problem except as previously stated.  Vital Signs: BP 172/110  Pulse 73  Ht 5\' 11"  (1.803 m)  Wt 262 lb 6.4 oz (119.024 kg)  BMI 36.60 kg/m2  Physical Exam  Patient is in NAD  HEENT:  Normocephalic, atraumatic. EOMI, PERRLA.  Neck: JVP is normal. No thyromegaly. No bruits.  Lungs: clear to auscultation. No rales no wheezes.  Heart: Regular rate and rhythm. Normal S1, S2. No S3.  Gr I/VI systolic murmur. PMI not displaced.  Abdomen:  Supple, obese, nontender. Normal bowel sounds. No masses. No hepatomegaly.  Extremities:   Good distal pulses throughout. No lower extremity edema.  Musculoskeletal :moving all extremities.  Neuro:   alert and oriented x3.  CN II-XII grossly intact.  EKG:  Sinus rhythm.  T wave inversion V3 to V6.   Assessment and Plan:

## 2011-10-13 NOTE — Assessment & Plan Note (Signed)
Claims he is tolerating and using CPAP.

## 2011-10-13 NOTE — Assessment & Plan Note (Addendum)
S/p AVR.  LV is thickened on previous echo with vigorous function. Exam without evid of signif outflow obstruction. Does have diastolic dysfunction.   Follow.  Control BP.

## 2011-10-13 NOTE — Assessment & Plan Note (Addendum)
BP is high today.  Patient attrib to stress.   I agree.  He is affected by stress significantly. Will need to be followed closely.

## 2011-10-13 NOTE — Assessment & Plan Note (Signed)
Need to get latest values.

## 2011-10-13 NOTE — Assessment & Plan Note (Signed)
Needs to lose wt.  Will get records from A. Lucianne Muss.

## 2011-11-04 ENCOUNTER — Encounter: Payer: Self-pay | Admitting: Internal Medicine

## 2011-12-31 ENCOUNTER — Telehealth: Payer: Self-pay | Admitting: Hematology & Oncology

## 2011-12-31 NOTE — Telephone Encounter (Signed)
Left pt message to call for appointment °

## 2012-01-03 ENCOUNTER — Telehealth: Payer: Self-pay | Admitting: Hematology & Oncology

## 2012-01-03 NOTE — Telephone Encounter (Signed)
Pt aware of 5-13 appointment °

## 2012-01-10 ENCOUNTER — Ambulatory Visit (HOSPITAL_BASED_OUTPATIENT_CLINIC_OR_DEPARTMENT_OTHER): Payer: 59 | Admitting: Hematology & Oncology

## 2012-01-10 ENCOUNTER — Ambulatory Visit: Payer: 59

## 2012-01-10 ENCOUNTER — Other Ambulatory Visit: Payer: Self-pay

## 2012-01-10 ENCOUNTER — Other Ambulatory Visit (HOSPITAL_BASED_OUTPATIENT_CLINIC_OR_DEPARTMENT_OTHER): Payer: 59 | Admitting: Lab

## 2012-01-10 VITALS — BP 160/105 | HR 64 | Temp 97.2°F | Ht 69.0 in | Wt 259.0 lb

## 2012-01-10 DIAGNOSIS — R5383 Other fatigue: Secondary | ICD-10-CM

## 2012-01-10 DIAGNOSIS — D751 Secondary polycythemia: Secondary | ICD-10-CM | POA: Insufficient documentation

## 2012-01-10 DIAGNOSIS — G4730 Sleep apnea, unspecified: Secondary | ICD-10-CM

## 2012-01-10 LAB — CBC WITH DIFFERENTIAL (CANCER CENTER ONLY)
BASO#: 0.2 10*3/uL (ref 0.0–0.2)
Eosinophils Absolute: 0.2 10*3/uL (ref 0.0–0.5)
HGB: 18.1 g/dL — ABNORMAL HIGH (ref 13.0–17.1)
LYMPH#: 1.8 10*3/uL (ref 0.9–3.3)
MONO#: 0.9 10*3/uL (ref 0.1–0.9)
NEUT#: 9.2 10*3/uL — ABNORMAL HIGH (ref 1.5–6.5)
RBC: 5.73 10*6/uL — ABNORMAL HIGH (ref 4.20–5.70)

## 2012-01-10 LAB — CHCC SATELLITE - SMEAR

## 2012-01-10 NOTE — Progress Notes (Signed)
This office note has been dictated.

## 2012-01-11 LAB — IRON AND TIBC
%SAT: 16 % — ABNORMAL LOW (ref 20–55)
Iron: 57 ug/dL (ref 42–165)
TIBC: 365 ug/dL (ref 215–435)
UIBC: 308 ug/dL (ref 125–400)

## 2012-01-11 LAB — ERYTHROPOIETIN: Erythropoietin: 7 m[IU]/mL (ref 2.6–34.0)

## 2012-01-11 NOTE — Progress Notes (Signed)
CC:   [name], M.D.  DIAGNOSIS:  Erythrocytosis, possible polycythemia.  HISTORY OF PRESENT ILLNESS:  [name] is a very nice 61 year old white gentleman.  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 He lives in [place], probably about a mile away from where I do.  He is followed by Dr. Eric Form.  Chase Gutierrez apparently has issues with kidney stones and kidneys themselves.  He has seen Dr. Willow Ora in the past for kidney stones.  He has a urologist down in [place] who he saw back in the wintertime. He has a house down in [facility] [place] [facility].  I guess he was having some issues.  He had x-rays done down there, which he says revealed a cyst and "tumors" with his kidney.  He is awaiting a 2nd scan in about 6 months.  He has been noted to have some erythrocytosis.  This apparently has gone back about, I think, a year or so.  He says he does have pruritus after he showers.  He has occasional headaches.  He says he does feel tired.  Of note, he does have an aortic valve prostatic.  This is a bovine valve. This was done up at the [facility].  This was done back in March 2010.  He has not required any anticoagulation for this.  There is some question of neuropathy on him.  He does not have actual diabetes.  Does have low testosterone levels.  He is on quite a few medications.  Unfortunately, the lab work I have on him only goes back to March 2013. At that point in time, CBC was done which showed a white count of 9.3, hemoglobin 19.8, hematocrit 37.1, platelet count 260.  He had normal BUN and creatinine.  TSH was normal.  His testosterone was actually on the low side.  I do not see any iron studies that were done.  An erythropoietin level was not done.  In April, his CBC showed a white count 9.5, hemoglobin 9.6,  hematocrit 56 and platelet count 269.  MCV is 92.  He was kindly referred to the [facility] for evaluation.  He said  that his father, who had lung cancer, needed have his blood taken out.  Mr. Vegh does not smoke.  He really has had no other issues outside of the kidney problems.  He is on quite a few medications.  He has not had any kind of weight loss or weight gain.  He denies any kind of blurry vision.  Again, he just feels tired relatively easily.  He has had no leg swelling.  He has not noticed any obvious rashes.  He has had no change in bowel or bladder habits.   He does have sleep apnea.  He is on CPAP.  PAST MEDICAL HISTORY: 1. Aortic stenosis, status post aortic valve repair with a bovine     valve. 2. Hyperglycemia. 3. Hypertension. 4. Gout. 5. Metabolic syndrome. 6. Nephrolithiasis. 7. Depression. 8. Hypothyroidism. 9. Obstructive sleep apnea. 10.Hypotestosteronism.  ALLERGIES:  None.  MEDICATIONS:  Elavil 25 mg p.o. q.h.s., Nuvigil 150 mg p.o. daily, hydrochlorothiazide 12.5 mg p.o. daily, Synthroid 0.025 mg p.o. daily, Bystolic 5 mg p.o. daily, Simcor (500/20) 1 p.o. daily, Viibryd 40 mg p.o. daily.  SOCIAL HISTORY:  Negative for tobacco use.  There is some social alcohol use.  He has no obvious occupational exposures.  FAMILY HISTORY:  Relatively noncontributory.  REVIEW OF SYSTEMS:  As stated  in history present illness.  PHYSICAL EXAMINATION:  This is a mildly obese white gentleman in no obvious distress.  He does have some facial plethora.  His vital signs show temperature of 97.2, pulse 64, respiratory rate 22, blood pressure 160/105.  Weight is 259.  Head and neck:  Normocephalic, atraumatic skull.  There are no ocular or oral lesions.  There are no palpable or supraclavicular lymph nodes.  He has no conjunctival inflammation. Thyroid is not palpable.  Lungs:  Clear bilaterally.  Cardiac:  Regular rate and rhythm with a normal S1, S2.  He has a 2/6 systolic ejection murmur.  Abdomen:  Soft, mildly obese.  He has good bowel sounds.  There is no fluid wave.   There is no palpable hepatosplenomegaly. Back:  No tenderness over the spine, ribs, or hips.  Extremities:  No clubbing, cyanosis or edema.  He has good pulses in his distal extremities.  Skin; Some slight ruddy complexion.  He has no rashes.  There is no hyperpigmented lesions that are suspicious.  Neurologic:  No focal neurological deficits.  LABORATORY STUDIES:  White cell count 12.3, hemoglobin 18.1, hematocrit 49.9, platelet count 214.  MCV is 87.  Peripheral smear shows a normochromic, normocytic population of red blood cells.  There are no nucleated red blood cells.  I see no teardrop cells.  I see no schistocytes.  There are no target cells.  White cells appear mildly increased in number.  There are no immature myeloid cells.  There are no hypersegmented polys.  There are no blasts.  There are no atypical lymphocytes.  Platelets are adequate in number and size.  IMPRESSION:  Chase Gutierrez is a really nice 61 year old gentleman with erythrocytosis.  I think it is hard to say whether he has polycythemia.  Today, his blood count is really not all that bad.  His hemoglobin is barely above normal at 18 1.  I find it very interesting that he was on testosterone replacement therapy.  This clearly would have been a stimulus for red cell production.  I also find it interesting that he has sleep apnea.  The fact that he has sleep apnea could also put him at increased risk for a secondary polycythemia.  He could develop the "Pickwickian syndrome" if he does not stay diligent with the CPAP.  I also find it interesting that he has these renal issues.  He had a scan done down in [place].  I am going to try to get a copy of the scans to see exactly when this "cyst" and "tumor" are.  It does not sound like he has polycystic kidneys.  I am sending off an erythropoietin level on him.  I am also sending off iron studies.  Finally, I am sending off a JAK2 assay.  We may have to  phlebotomize him because of his symptoms of being tired. It is possible that he just may have "too much blood" and needs to have his blood thinned down a little bit.  He is taking aspirin, which I totally agree with.  I spent a good hour or more with Mr. Jacinto today.  I had a good time talking with him.  He is really a nice guy.  I definitely want to try to improve his quality of life so that he can have more of an active lifestyle.  I totally agree with holding any testosterone replacement therapy.  I also told him not to take any iron supplements.  I will call  Mr. Diantonio when I have all his lab results back and we will try to take care of the followup over the phone.    ______________________________ Josph Macho, M.D. PRE/MEDQ  D:  01/10/2012  T:  01/11/2012  Job:  [id number]

## 2012-01-13 LAB — JAK2 GENOTYPR

## 2012-01-14 ENCOUNTER — Telehealth: Payer: Self-pay | Admitting: Internal Medicine

## 2012-01-14 ENCOUNTER — Encounter: Payer: Self-pay | Admitting: Internal Medicine

## 2012-01-14 NOTE — Telephone Encounter (Signed)
error 

## 2012-01-20 ENCOUNTER — Other Ambulatory Visit: Payer: Self-pay | Admitting: Hematology & Oncology

## 2012-01-20 ENCOUNTER — Telehealth: Payer: Self-pay | Admitting: Hematology & Oncology

## 2012-01-20 DIAGNOSIS — D45 Polycythemia vera: Secondary | ICD-10-CM

## 2012-01-20 NOTE — Telephone Encounter (Signed)
Pt called said MD told him to call and set up phlebotomy. Per MD patient aware Dr. Myna Hidalgo will call him during lunch.

## 2012-01-21 ENCOUNTER — Ambulatory Visit (HOSPITAL_BASED_OUTPATIENT_CLINIC_OR_DEPARTMENT_OTHER): Payer: 59

## 2012-01-21 ENCOUNTER — Telehealth: Payer: Self-pay | Admitting: Hematology & Oncology

## 2012-01-21 VITALS — BP 153/95 | HR 82 | Temp 96.7°F

## 2012-01-21 DIAGNOSIS — D751 Secondary polycythemia: Secondary | ICD-10-CM

## 2012-01-21 NOTE — Patient Instructions (Signed)

## 2012-01-21 NOTE — Progress Notes (Signed)
Chase Gutierrez presents today for phlebotomy per MD orders. Phlebotomy procedure started at 1335and ended at 1347. Approximately removed. Patient observed for 30 minutes after procedure without any incident. Patient tolerated procedure well. IV needle removed intact.

## 2012-01-21 NOTE — Telephone Encounter (Signed)
Per in basket note left messages with all numbers for patient to call

## 2012-01-27 ENCOUNTER — Ambulatory Visit (HOSPITAL_BASED_OUTPATIENT_CLINIC_OR_DEPARTMENT_OTHER): Payer: 59

## 2012-01-27 VITALS — BP 159/95 | HR 68 | Temp 97.5°F

## 2012-01-27 DIAGNOSIS — D751 Secondary polycythemia: Secondary | ICD-10-CM

## 2012-01-27 MED ORDER — LORAZEPAM 0.5 MG PO TABS
0.5000 mg | ORAL_TABLET | Freq: Once | ORAL | Status: AC
  Administered 2012-01-27: 0.5 mg via ORAL

## 2012-01-27 NOTE — Progress Notes (Signed)
Chase Gutierrez presents today for phlebotomy per MD orders. Phlebotomy procedure started at 1615 and ended at 1635. 500 grams removed. Patient observed for 30 minutes after procedure without any incident. Patient tolerated procedure well. IV needle removed intact. Pt did ask for something to calm him down.  Per Dr. Myna Hidalgo, Ativan given. There was difficulity.  First IV stick per right anticubical  initialy received blood return but after 75grams blood clotted.  Next IV per left upper arm the vein blew.  The fourth IV per left anticubical flowed slowly but we did get the correct amount of blood.

## 2012-02-03 ENCOUNTER — Ambulatory Visit (HOSPITAL_BASED_OUTPATIENT_CLINIC_OR_DEPARTMENT_OTHER): Payer: 59

## 2012-02-03 VITALS — BP 143/92 | HR 87 | Temp 97.9°F

## 2012-02-03 DIAGNOSIS — D751 Secondary polycythemia: Secondary | ICD-10-CM

## 2012-02-03 NOTE — Patient Instructions (Signed)

## 2012-02-03 NOTE — Progress Notes (Signed)
Chase Gutierrez presents today for phlebotomy per MD orders. Phlebotomy procedure started at 1600 and ended at 1625. 500 grams removed. Patient observed for 30 minutes after procedure without any incident. Patient tolerated procedure well. IV needle removed intact.

## 2012-02-03 NOTE — Progress Notes (Signed)
Chase Gutierrez presents today for phlebotomy per MD orders. Phlebotomy procedure started at 1600 and ended at 1620. Approximately 500 mls  removed. Patient observed for 30 minutes after procedure without any incident. Patient tolerated procedure well. IV needle removed intact.

## 2012-02-11 ENCOUNTER — Other Ambulatory Visit (HOSPITAL_BASED_OUTPATIENT_CLINIC_OR_DEPARTMENT_OTHER): Payer: 59 | Admitting: Lab

## 2012-02-11 ENCOUNTER — Ambulatory Visit (HOSPITAL_BASED_OUTPATIENT_CLINIC_OR_DEPARTMENT_OTHER): Payer: 59 | Admitting: Hematology & Oncology

## 2012-02-11 ENCOUNTER — Ambulatory Visit (HOSPITAL_BASED_OUTPATIENT_CLINIC_OR_DEPARTMENT_OTHER): Payer: 59

## 2012-02-11 VITALS — BP 120/84 | HR 66 | Temp 97.2°F | Ht 69.0 in | Wt 265.0 lb

## 2012-02-11 DIAGNOSIS — D751 Secondary polycythemia: Secondary | ICD-10-CM

## 2012-02-11 DIAGNOSIS — D45 Polycythemia vera: Secondary | ICD-10-CM

## 2012-02-11 LAB — CBC WITH DIFFERENTIAL (CANCER CENTER ONLY)
BASO%: 2.6 % — ABNORMAL HIGH (ref 0.0–2.0)
EOS%: 5.6 % (ref 0.0–7.0)
HCT: 43.4 % (ref 38.7–49.9)
LYMPH%: 23.8 % (ref 14.0–48.0)
MCH: 31.3 pg (ref 28.0–33.4)
MCHC: 35 g/dL (ref 32.0–35.9)
MCV: 89 fL (ref 82–98)
MONO#: 0.8 10*3/uL (ref 0.1–0.9)
MONO%: 8.9 % (ref 0.0–13.0)
NEUT%: 59.1 % (ref 40.0–80.0)
Platelets: 237 10*3/uL (ref 145–400)
RDW: 13.4 % (ref 11.1–15.7)

## 2012-02-11 LAB — FERRITIN: Ferritin: 63 ng/mL (ref 22–322)

## 2012-02-11 NOTE — Progress Notes (Signed)
This office note has been dictated.

## 2012-02-11 NOTE — Progress Notes (Signed)
Chase Gutierrez presents today for phlebotomy per MD orders. Phlebotomy procedure started at 1500 and ended at 1510. 500 grams removed. Patient observed for 30 minutes after procedure without any incident. Patient tolerated procedure well. IV needle removed intact.

## 2012-02-11 NOTE — Progress Notes (Signed)
CC:   Chase Gutierrez, M.D.  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  CURRENT THERAPY:  Phlebotomy to maintain hematocrit less than 45.  INTERIM HISTORY:  Chase Gutierrez comes in for followup.  This is his 2nd office visit.  We have been seeing him and phlebotomizing him.  When we 1st saw back in May, his hematocrit was 50 with a hemoglobin of 18.1.  We did do studies to evaluate for polycythemia.  His B12 was normal at 464.  His ferritin was 130 with a low iron saturation of 16.  His LDH was a little elevated at 251.  His erythropoietin level was very low at 7.  We did do the JAK2 assay.  His JAK2 was negative.  He has been phlebotomized 3 times.  He states that he feels better.  He does not feel sluggish.  He feels as if he has more energy.  He is going to go out on disability from his job.  He does have other health issues.  The stress of his job is just too much for him.  I can appreciate this.  He is going to get married next month down in [place].  He is also going to have another MRI done at the [facility] in [place].  This to evaluate the "tumors" with his kidneys.  PHYSICAL EXAMINATION:  This is a slightly obese white gentleman in no obvious distress.  Vital signs:  97.2, pulse 66, respiratory rate 18, blood pressure 120/84.  Weight is 265.  Head and neck:  A normocephalic, atraumatic skull.  There are no ocular or oral lesions.  There are no palpable cervical or supraclavicular lymph nodes.  Lungs:  Clear bilaterally.  He has no rales, wheeze or rhonchi.  Cardiac:  Regular rate and rhythm with a normal S1 and S2.  There are no murmurs, rubs or bruits.  Abdomen:  Soft with good bowel sounds.  There is no palpable abdominal mass.  There is no fluid wave.  There is no palpable hepatosplenomegaly.  Back"  No tenderness over the spine, ribs, or hips. Extremities:  No clubbing, cyanosis or edema.  Skin:  Decrease in his facial plethora.  His skin lacks the ruddy  complexion that had initially.  LABORATORY STUDIES:  White cell count is 8.6, hemoglobin 15, hematocrit 45, platelet count is 237.  MCV is 89.  IMPRESSION:  Chase Gutierrez is a 61 year old gentleman with polycythemia. One would have to suspect that this actually may be polycythemia vera. Again, the erythropoietin level being so low, to me, is a good indicator for a primary polycythemia.  One would think that with such a low erythropoietin level, that his bone marrow would not have the "stimulus" to make red cells.  We will continue to phlebotomize him.  He would like to be phlebotomized today.  I want to see him back before he goes back down to [place] for his wedding.  I want to make sure that he is adequately phlebotomized so that we can optimize his overall functional status.  I am going to need to make sure we get him on aspirin.  I think low-dose aspirin would be a good idea for him.  I will see him back in another 3 weeks or so.    ______________________________ Josph Macho, M.D. PRE/MEDQ  D:  02/11/2012  T:  02/11/2012  Job:  [id number]

## 2012-02-14 ENCOUNTER — Encounter: Payer: Self-pay | Admitting: Internal Medicine

## 2012-02-14 ENCOUNTER — Other Ambulatory Visit: Payer: Self-pay | Admitting: Hematology & Oncology

## 2012-02-16 ENCOUNTER — Telehealth: Payer: Self-pay | Admitting: *Deleted

## 2012-02-16 MED ORDER — ASPIRIN 81 MG PO TBEC: 81.0000 mg | DELAYED_RELEASE_TABLET | Freq: Every day | ORAL | Status: DC

## 2012-02-16 NOTE — Telephone Encounter (Signed)
Spoke to pt. Asked him to start taking a Baby ASA (81 mg) daily per Dr Gustavo Lah request. He stated that he is already taking one. He has been taking it since he started his cholesterol medication. Encouraged him to take a EC ASA to help protect his stomach. He verbalized understanding. ASA EC 81 mg added to med list.

## 2012-03-08 ENCOUNTER — Ambulatory Visit: Payer: 59 | Admitting: Hematology & Oncology

## 2012-03-08 ENCOUNTER — Other Ambulatory Visit: Payer: 59 | Admitting: Lab

## 2012-03-10 ENCOUNTER — Other Ambulatory Visit (HOSPITAL_BASED_OUTPATIENT_CLINIC_OR_DEPARTMENT_OTHER): Payer: 59 | Admitting: Lab

## 2012-03-10 ENCOUNTER — Ambulatory Visit (HOSPITAL_BASED_OUTPATIENT_CLINIC_OR_DEPARTMENT_OTHER): Payer: 59 | Admitting: Hematology & Oncology

## 2012-03-10 ENCOUNTER — Ambulatory Visit (HOSPITAL_BASED_OUTPATIENT_CLINIC_OR_DEPARTMENT_OTHER): Payer: 59

## 2012-03-10 VITALS — BP 153/93 | HR 92 | Temp 96.7°F | Wt 266.0 lb

## 2012-03-10 DIAGNOSIS — D751 Secondary polycythemia: Secondary | ICD-10-CM

## 2012-03-10 DIAGNOSIS — D45 Polycythemia vera: Secondary | ICD-10-CM

## 2012-03-10 LAB — IRON AND TIBC
%SAT: 20 % (ref 20–55)
Iron: 79 ug/dL (ref 42–165)

## 2012-03-10 LAB — CBC WITH DIFFERENTIAL (CANCER CENTER ONLY)
BASO%: 2.3 % — ABNORMAL HIGH (ref 0.0–2.0)
HCT: 43.3 % (ref 38.7–49.9)
HGB: 15.1 g/dL (ref 13.0–17.1)
LYMPH#: 1.8 10*3/uL (ref 0.9–3.3)
MONO#: 0.7 10*3/uL (ref 0.1–0.9)
NEUT#: 6.7 10*3/uL — ABNORMAL HIGH (ref 1.5–6.5)
NEUT%: 67.3 % (ref 40.0–80.0)
RDW: 12.3 % (ref 11.1–15.7)
WBC: 9.9 10*3/uL (ref 4.0–10.0)

## 2012-03-10 LAB — CHCC SATELLITE - SMEAR

## 2012-03-10 NOTE — Progress Notes (Signed)
CC:   Pricilla Riffle, MD, [facility] [name], M.D.  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  CURRENT THERAPY:  Phlebotomy to maintain hemoglobin less than 15, hematocrit less than 45.  INTERIM HISTORY:  Chase Gutierrez comes in for followup.  He does feel better after being phlebotomized.  We have gotten his hemoglobin down fairly nicely.  He is looking forward to his upcoming wedding next week.  We are getting his iron levels down.  Today, his ferritin was already down to 22.  He is going to have an MRI down in [place] because of this kidney cyst. I think this is on his right kidney.  He still does get tired to some degree.  It is still tough for him with respect to the job because of stress and everything that he is undergoing physically.  PHYSICAL EXAMINATION:  This is a well-developed, well-nourished white gentleman in no obvious distress.  Vital signs:  96.7, pulse 92, respiratory rate 18, blood pressure 153/93.  Weight is 266.  Head and neck:  Normocephalic, atraumatic skull.  There are no ocular or oral lesions.  He does not have any conjunctival inflammation.  There is much less plethora on his face.  Lungs:  Clear bilaterally.  He may have some slight decrease at the bases.  Cardiac:  Regular rate and rhythm with a normal S1 and S2.  There are no murmurs, rubs or bruits.  Abdomen:  Soft with good bowel sounds.  There is no palpable abdominal mass.  There is no palpable hepatosplenomegaly.  Extremities:  Some slight nonpitting edema around his ankles.  He has good pulses in his distal extremities. There is some hyperesthesia to palpation of his lower legs. Neurological:  No focal neurological deficits outside of the hyperesthesia in his feet.  Skin:  No rashes, ecchymosis or petechia.  LABORATORY STUDIES:  White cell count is 9.9, hemoglobin 15, hematocrit 43.3, platelet count is 210.  MCV is 87.  IMPRESSION:  Chase Gutierrez is a 61 year old gentleman with  polycythemia. Even though he is JAK2-negative, I have to suspect that he has polycythemia.  His erythropoietin level when we first saw him was only 7, but yet his hemoglobin was 18.  His iron saturation was only 16%, but yet he had erythrocytosis.  I do believe that a phlebotomy program is working for him.  Again, his ferritin is now down to 22.  We will phlebotomize him now.  He will be down in [place] for a couple of weeks with the wedding.  We will plan to get him back probably in about 3 or 4 weeks and will see how his blood count is doing whether or not we need to phlebotomize him.    ______________________________ Josph Macho, M.D. PRE/MEDQ  D:  03/10/2012  T:  03/10/2012  Job:  [id number]

## 2012-03-10 NOTE — Progress Notes (Signed)
Radene Gunning presents today for phlebotomy per MD orders. Phlebotomy procedure started at 0910 and ended at 0922 . Approximately 500 mls removed. Patient observed for 30 minutes after procedure without any incident. Patient tolerated procedure well. IV needle removed intact.

## 2012-03-10 NOTE — Patient Instructions (Addendum)

## 2012-03-10 NOTE — Progress Notes (Signed)
This office note has been dictated.

## 2012-03-13 ENCOUNTER — Telehealth: Payer: Self-pay | Admitting: Hematology & Oncology

## 2012-03-13 NOTE — Telephone Encounter (Signed)
Mailed august schedule °

## 2012-04-10 ENCOUNTER — Ambulatory Visit (HOSPITAL_BASED_OUTPATIENT_CLINIC_OR_DEPARTMENT_OTHER): Payer: 59

## 2012-04-10 ENCOUNTER — Other Ambulatory Visit (HOSPITAL_BASED_OUTPATIENT_CLINIC_OR_DEPARTMENT_OTHER): Payer: 59 | Admitting: Lab

## 2012-04-10 ENCOUNTER — Ambulatory Visit (HOSPITAL_BASED_OUTPATIENT_CLINIC_OR_DEPARTMENT_OTHER): Payer: 59 | Admitting: Hematology & Oncology

## 2012-04-10 VITALS — BP 139/86 | HR 53 | Temp 97.1°F | Resp 53 | Ht 69.0 in | Wt 263.0 lb

## 2012-04-10 VITALS — BP 128/78 | HR 80

## 2012-04-10 DIAGNOSIS — D751 Secondary polycythemia: Secondary | ICD-10-CM

## 2012-04-10 DIAGNOSIS — D45 Polycythemia vera: Secondary | ICD-10-CM

## 2012-04-10 LAB — CBC WITH DIFFERENTIAL (CANCER CENTER ONLY)
BASO%: 2.1 % — ABNORMAL HIGH (ref 0.0–2.0)
EOS%: 4.9 % (ref 0.0–7.0)
HCT: 43.8 % (ref 38.7–49.9)
LYMPH#: 2.1 10*3/uL (ref 0.9–3.3)
LYMPH%: 26.1 % (ref 14.0–48.0)
MCH: 29 pg (ref 28.0–33.4)
MCHC: 34.2 g/dL (ref 32.0–35.9)
MCV: 85 fL (ref 82–98)
MONO%: 7.7 % (ref 0.0–13.0)
NEUT%: 59.2 % (ref 40.0–80.0)
RDW: 12.4 % (ref 11.1–15.7)

## 2012-04-10 NOTE — Patient Instructions (Addendum)
Therapeutic Phlebotomy Therapeutic phlebotomy is the controlled removal of blood from your body for the purpose of treating a medical condition. It is similar to donating blood. Usually, about a pint (470 mL) of blood is removed. The average adult has 9 to 12 pints (4.3 to 5.7 L) of blood. Therapeutic phlebotomy may be used to treat the following medical conditions:  Hemochromatosis. This is a condition in which there is too much iron in the blood.   Polycythemia vera. This is a condition in which there are too many red cells in the blood.   Porphyria cutanea tarda. This is a disease usually passed from one generation to the next (inherited). It is a condition in which an important part of hemoglobin is not made properly. This results in the build up of abnormal amounts of porphyrins in the body.   Sickle cell disease. This is an inherited disease. It is a condition in which the red blood cells form an abnormal crescent shape rather than a round shape.  LET YOUR CAREGIVER KNOW ABOUT:  Allergies.   Medicines taken including herbs, eyedrops, over-the-counter medicines, and creams.   Use of steroids (by mouth or creams).   Previous problems with anesthetics or numbing medicine.   History of blood clots.   History of bleeding or blood problems.   Previous surgery.   Possibility of pregnancy, if this applies.  RISKS AND COMPLICATIONS This is a simple and safe procedure. Problems are unlikely. However, problems can occur and may include:  Nausea or lightheadedness.   Low blood pressure.   Soreness, bleeding, swelling, or bruising at the needle insertion site.   Infection.  BEFORE THE PROCEDURE  This is a procedure that can be done as an outpatient. Confirm the time that you need to arrive for your procedure. Confirm whether there is a need to fast or withhold any medications. It is helpful to wear clothing with sleeves that can be raised above the elbow. A blood sample may be done  to determine the amount of red blood cells or iron in your blood. Plan ahead of time to have someone drive you home after the procedure. PROCEDURE The entire procedure from preparation through recovery takes about 1 hour. The actual collection takes about 10 to 15 minutes.  A needle will be inserted into your vein.   Tubing and a collection bag will be attached to that needle.   Blood will flow through the needle and tubing into the collection bag.   You may be asked to open and close your hand slowly and continuously during the entire collection.   Once the specified amount of blood has been removed from your body, the collection bag and tubing will be clamped.   The needle will be removed.   Pressure will be held on the site of the needle insertion to stop the bleeding. Then a bandage will be placed over the needle insertion site.  AFTER THE PROCEDURE  Your recovery will be assessed and monitored. If there are no problems, as an outpatient, you should be able to go home shortly after the procedure.  Document Released: 01/18/2011 Document Revised: 08/05/2011 Document Reviewed: 01/18/2011 Exit[employer] Patient Information 2012 [employer]. 

## 2012-04-10 NOTE — Progress Notes (Signed)
Chase Gutierrez presents today for phlebotomy per MD orders. Phlebotomy procedure started at 0950 and ended at 1000.  500 grams removed. Patient observed for 30 minutes after procedure without any incident. Patient tolerated procedure well. IV needle removed intact.

## 2012-04-10 NOTE — Progress Notes (Signed)
This office note has been dictated.

## 2012-04-11 NOTE — Progress Notes (Signed)
CC:   Pricilla Riffle, MD, [facility] [name], M.D.  DIAGNOSIS:  Polycythemia vera, JAK2 negative.  CURRENT THERAPY:  Phlebotomy to maintain hematocrit less than 42.  INTERIM HISTORY:  Mr. Rowlette comes in for his followup.  He is now married.  He was down in [place] for 3 weeks, where he has a house.  He got married down in [place].  He and his new bride had a nice time while they were down there.  He does feel tired.  He does tend to get a little bit "sluggish" when his blood count goes up.  When we last saw him about a month ago, his ferritin was down to 22.  He has had no fever.  He has had no cough.  He had a CT scan done here.  He reports that there is no change on the CT scan regarding the renal cysts.  I cannot find the report in our system but will somehow get this.  He has had no change in bowel or bladder habits.  He has had no headache.  He has had no leg swelling.  There have been no rashes.  PHYSICAL EXAMINATION:  This is a well-developed, well-nourished white gentleman in no obvious distress.  Vital signs:  97.1, pulse 53, respiratory rate 18, blood pressure is 139/86.  Weight is 263.  Head and neck:  Normocephalic, atraumatic skull.  There are no ocular or oral lesions.  There are no palpable cervical or supraclavicular lymph nodes. Lungs:  Clear bilaterally.  Cardiac:  Regular rate and rhythm with a normal S1, S2.  There are no murmurs, rubs or bruits.  Abdomen:  Soft with good bowel sounds.  There is no fluid wave.  There is no palpable hepatosplenomegaly.  Extremities:  No clubbing, cyanosis or edema. Skin:  Some slight plethora to his face.  He has a little bit of a ruddy complexion on his skin.  LABORATORIES STUDIES:  White cell count is 8.2, hemoglobin 15, hematocrit 43.8, platelet count is 189.  IMPRESSION:  Mr. Lanigan is a 61 year old white gentleman with polycythemia.  He does well with our phlebotomies.  We will go ahead and phlebotomize him  again.  Again, I think that we are going to have to try to keep his hematocrit below 42.  I think that he probably will do better below that level.  We will check his lab work next week.  We will have to be aggressive with followup.    ______________________________ Josph Macho, M.D. PRE/MEDQ  D:  04/10/2012  T:  04/11/2012  Job:  [id number]

## 2012-04-17 ENCOUNTER — Ambulatory Visit (HOSPITAL_BASED_OUTPATIENT_CLINIC_OR_DEPARTMENT_OTHER): Payer: 59

## 2012-04-17 ENCOUNTER — Other Ambulatory Visit (HOSPITAL_BASED_OUTPATIENT_CLINIC_OR_DEPARTMENT_OTHER): Payer: 59 | Admitting: Lab

## 2012-04-17 VITALS — BP 111/71 | HR 53 | Temp 96.7°F | Resp 18

## 2012-04-17 DIAGNOSIS — D45 Polycythemia vera: Secondary | ICD-10-CM

## 2012-04-17 DIAGNOSIS — D751 Secondary polycythemia: Secondary | ICD-10-CM

## 2012-04-17 LAB — CBC WITH DIFFERENTIAL (CANCER CENTER ONLY)
BASO#: 0.2 10*3/uL (ref 0.0–0.2)
EOS%: 4.7 % (ref 0.0–7.0)
Eosinophils Absolute: 0.4 10*3/uL (ref 0.0–0.5)
HCT: 43.3 % (ref 38.7–49.9)
HGB: 14.7 g/dL (ref 13.0–17.1)
LYMPH#: 1.7 10*3/uL (ref 0.9–3.3)
MCHC: 33.9 g/dL (ref 32.0–35.9)
NEUT#: 5.1 10*3/uL (ref 1.5–6.5)
NEUT%: 63.2 % (ref 40.0–80.0)
RBC: 5.14 10*6/uL (ref 4.20–5.70)

## 2012-04-17 NOTE — Patient Instructions (Signed)

## 2012-04-17 NOTE — Progress Notes (Signed)
Chase Gutierrez presents today for phlebotomy per MD orders. Phlebotomy procedure started at 0905 and ended at 0-915. Approximately removed. Patient observed for 30 minutes after procedure without any incident. Patient tolerated procedure well. IV needle removed intact. Teola Bradley, Ladaja Yusupov [employer]

## 2012-05-05 ENCOUNTER — Other Ambulatory Visit (HOSPITAL_BASED_OUTPATIENT_CLINIC_OR_DEPARTMENT_OTHER): Payer: 59 | Admitting: Lab

## 2012-05-05 ENCOUNTER — Other Ambulatory Visit: Payer: Self-pay | Admitting: *Deleted

## 2012-05-08 ENCOUNTER — Ambulatory Visit (HOSPITAL_BASED_OUTPATIENT_CLINIC_OR_DEPARTMENT_OTHER): Payer: 59

## 2012-05-08 ENCOUNTER — Ambulatory Visit (HOSPITAL_BASED_OUTPATIENT_CLINIC_OR_DEPARTMENT_OTHER): Payer: 59 | Admitting: Lab

## 2012-05-08 ENCOUNTER — Ambulatory Visit (HOSPITAL_BASED_OUTPATIENT_CLINIC_OR_DEPARTMENT_OTHER): Payer: 59 | Admitting: Hematology & Oncology

## 2012-05-08 VITALS — BP 145/89 | HR 59 | Temp 98.0°F | Resp 18

## 2012-05-08 DIAGNOSIS — D509 Iron deficiency anemia, unspecified: Secondary | ICD-10-CM

## 2012-05-08 DIAGNOSIS — D45 Polycythemia vera: Secondary | ICD-10-CM

## 2012-05-08 DIAGNOSIS — R61 Generalized hyperhidrosis: Secondary | ICD-10-CM

## 2012-05-08 DIAGNOSIS — N289 Disorder of kidney and ureter, unspecified: Secondary | ICD-10-CM

## 2012-05-08 LAB — IRON AND TIBC: UIBC: 404 ug/dL — ABNORMAL HIGH (ref 125–400)

## 2012-05-08 LAB — CBC WITH DIFFERENTIAL (CANCER CENTER ONLY)
BASO#: 0.2 10*3/uL (ref 0.0–0.2)
EOS%: 5.5 % (ref 0.0–7.0)
Eosinophils Absolute: 0.4 10*3/uL (ref 0.0–0.5)
HCT: 41.7 % (ref 38.7–49.9)
HGB: 14 g/dL (ref 13.0–17.1)
LYMPH%: 23.9 % (ref 14.0–48.0)
MCH: 27.5 pg — ABNORMAL LOW (ref 28.0–33.4)
MCHC: 33.6 g/dL (ref 32.0–35.9)
MCV: 82 fL (ref 82–98)
MONO%: 8.8 % (ref 0.0–13.0)
NEUT#: 4.6 10*3/uL (ref 1.5–6.5)
NEUT%: 58.9 % (ref 40.0–80.0)
RBC: 5.09 10*6/uL (ref 4.20–5.70)

## 2012-05-08 LAB — BASIC METABOLIC PANEL - CANCER CENTER ONLY
BUN, Bld: 20 mg/dL (ref 7–22)
CO2: 29 mEq/L (ref 18–33)
Calcium: 8.9 mg/dL (ref 8.0–10.3)
Creat: 1.5 mg/dl — ABNORMAL HIGH (ref 0.6–1.2)
Glucose, Bld: 170 mg/dL — ABNORMAL HIGH (ref 73–118)

## 2012-05-08 NOTE — Progress Notes (Signed)
This office note has been dictated.

## 2012-05-08 NOTE — Patient Instructions (Signed)
Therapeutic Phlebotomy Therapeutic phlebotomy is the controlled removal of blood from your body for the purpose of treating a medical condition. It is similar to donating blood. Usually, about a pint (470 mL) of blood is removed. The average adult has 9 to 12 pints (4.3 to 5.7 L) of blood. Therapeutic phlebotomy may be used to treat the following medical conditions:  Hemochromatosis. This is a condition in which there is too much iron in the blood.   Polycythemia vera. This is a condition in which there are too many red cells in the blood.   Porphyria cutanea tarda. This is a disease usually passed from one generation to the next (inherited). It is a condition in which an important part of hemoglobin is not made properly. This results in the build up of abnormal amounts of porphyrins in the body.   Sickle cell disease. This is an inherited disease. It is a condition in which the red blood cells form an abnormal crescent shape rather than a round shape.  LET YOUR CAREGIVER KNOW ABOUT:  Allergies.   Medicines taken including herbs, eyedrops, over-the-counter medicines, and creams.   Use of steroids (by mouth or creams).   Previous problems with anesthetics or numbing medicine.   History of blood clots.   History of bleeding or blood problems.   Previous surgery.   Possibility of pregnancy, if this applies.  RISKS AND COMPLICATIONS This is a simple and safe procedure. Problems are unlikely. However, problems can occur and may include:  Nausea or lightheadedness.   Low blood pressure.   Soreness, bleeding, swelling, or bruising at the needle insertion site.   Infection.  BEFORE THE PROCEDURE  This is a procedure that can be done as an outpatient. Confirm the time that you need to arrive for your procedure. Confirm whether there is a need to fast or withhold any medications. It is helpful to wear clothing with sleeves that can be raised above the elbow. A blood sample may be done  to determine the amount of red blood cells or iron in your blood. Plan ahead of time to have someone drive you home after the procedure. PROCEDURE The entire procedure from preparation through recovery takes about 1 hour. The actual collection takes about 10 to 15 minutes.  A needle will be inserted into your vein.   Tubing and a collection bag will be attached to that needle.   Blood will flow through the needle and tubing into the collection bag.   You may be asked to open and close your hand slowly and continuously during the entire collection.   Once the specified amount of blood has been removed from your body, the collection bag and tubing will be clamped.   The needle will be removed.   Pressure will be held on the site of the needle insertion to stop the bleeding. Then a bandage will be placed over the needle insertion site.  AFTER THE PROCEDURE  Your recovery will be assessed and monitored. If there are no problems, as an outpatient, you should be able to go home shortly after the procedure.  Document Released: 01/18/2011 Document Revised: 08/05/2011 Document Reviewed: 01/18/2011 Exit[employer] Patient Information 2012 [employer]. 

## 2012-05-08 NOTE — Progress Notes (Signed)
Chase Gutierrez presents today for phlebotomy per MD orders. Phlebotomy procedure started at 0930 and ended at 0945 500 grams removed. Patient observed for 30 minutes after procedure without any incident. Patient tolerated procedure well. IV needle removed intact.

## 2012-05-09 NOTE — Progress Notes (Signed)
CC:   Pricilla Riffle, MD, [facility] [name], M.D.  DIAGNOSIS:  Polycythemia vera, JAK2 negative.  CURRENT THERAPY:  Phlebotomy to maintain hematocrit below 42.  INTERIM HISTORY:  Chase Gutierrez comes in for an unscheduled visit.  He says he is not feeling well.  He said he has not felt well for about a week.  He just feels very, very  tired.  He has had little energy.  He has had some diaphoresis.  He has had no chest pain.  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  He has had no problem with bowels or bladder.  He has not noticed any kind of rashes.  He has had no fever.  Again, he has had these cold sweats.  He has had no change in his medications.  He sees Dr. Tenny Craw of Cardiology.  I also sees Dr. Clelia Croft for family medical issues.  We did go ahead and do an EKG on him.  I compared this with an EKG he had done last year.  The EKG did not show any changes.  PHYSICAL EXAMINATION:  Vital signs are all stable.  Blood pressure 145/89.  Pulse is 59.  He is afebrile.  Lungs:  Clear bilaterally. Cardiac: Slow but regular.  He has 2/6 systolic ejection murmur. Abdomen:  Mildly obese but soft.  He has good bowel sounds.  There is no fluid wave.  There is no palpable hepatosplenomegaly.  Back:  No tenderness over the spine, ribs, or hips.  Extremities:  Good pulses in his feet.  He has trace edema in his legs.  He has good range motion of his joints.  Skin:  Exam shows the skin to be somewhat cold.  He does have some diaphoresis noted.  LABORATORY STUDIES:  White cell count is 7.8, hemoglobin 14, hematocrit 42, platelet count is 273. His electrolytes shows sodium of 135, potassium 4.5, BUN is 20, creatinine 1.5.  Glucose is 170.  IMPRESSION AND PLAN:  This is a 61 year old gentleman with polycythemia vera.  We are treating him as such.  I am not sure exactly what could be going on with him.  I know that he does feel better with his blood count a little on the lower side.  I forgot to  mention that he does have sleep apnea, but he is using his CPAP machine.  I do not see any obvious cardiac issue.  That was my 1st concern.  His heart rate is a little bit slow.  I told that he could probably cut back on his Bystolic.  He said he takes 30 mg a day.  I told him that he could go down to 20 mg a day.  I also told him that he could probably cut back on the lisinopril.  He has some mild renal insufficiency, which I do worry about.  He probably needs to go back to see either Dr. Tenny Craw or Dr. Clelia Croft.  I think he has appointments with them in a week or so.  Again, we will phlebotomize him 1 unit.  Will see if see makes him feel a bit better.  Of note, he is iron deficient.  His ferritin was 14 back in August.  Will keep Chase Gutierrez' regular appointment.    ______________________________ Josph Macho, M.D. PRE/MEDQ  D:  05/08/2012  T:  05/09/2012  Job:  [id number]

## 2012-05-11 ENCOUNTER — Other Ambulatory Visit: Payer: 59 | Admitting: Lab

## 2012-05-11 ENCOUNTER — Telehealth: Payer: Self-pay | Admitting: Internal Medicine

## 2012-05-11 ENCOUNTER — Ambulatory Visit: Payer: 59 | Admitting: Hematology & Oncology

## 2012-05-11 NOTE — Telephone Encounter (Signed)
Called patient and advised that he needs an echo per Dr. Tenny Craw. He will come in tomorrow at 4 pm for the echo. Chief complaint is fatigue. He will discuss this in more detail with Dr.Ross on the phone.

## 2012-05-11 NOTE — Telephone Encounter (Signed)
Patient returning nurse call he can be reached at [phone]

## 2012-05-12 ENCOUNTER — Ambulatory Visit (HOSPITAL_COMMUNITY): Payer: 59 | Attending: Cardiology | Admitting: Radiology

## 2012-05-12 ENCOUNTER — Other Ambulatory Visit (HOSPITAL_COMMUNITY): Payer: Self-pay | Admitting: Internal Medicine

## 2012-05-12 ENCOUNTER — Other Ambulatory Visit: Payer: Self-pay | Admitting: *Deleted

## 2012-05-12 DIAGNOSIS — I359 Nonrheumatic aortic valve disorder, unspecified: Secondary | ICD-10-CM

## 2012-05-12 DIAGNOSIS — R5383 Other fatigue: Secondary | ICD-10-CM

## 2012-05-12 DIAGNOSIS — R0602 Shortness of breath: Secondary | ICD-10-CM

## 2012-05-12 DIAGNOSIS — I10 Essential (primary) hypertension: Secondary | ICD-10-CM | POA: Insufficient documentation

## 2012-05-12 DIAGNOSIS — I369 Nonrheumatic tricuspid valve disorder, unspecified: Secondary | ICD-10-CM | POA: Insufficient documentation

## 2012-05-12 NOTE — Progress Notes (Signed)
Echocardiogram performed.  

## 2012-05-15 ENCOUNTER — Other Ambulatory Visit: Payer: Self-pay | Admitting: *Deleted

## 2012-05-15 ENCOUNTER — Other Ambulatory Visit (INDEPENDENT_AMBULATORY_CARE_PROVIDER_SITE_OTHER): Payer: 59

## 2012-05-15 DIAGNOSIS — R0602 Shortness of breath: Secondary | ICD-10-CM

## 2012-05-15 DIAGNOSIS — R5381 Other malaise: Secondary | ICD-10-CM

## 2012-05-15 DIAGNOSIS — R5383 Other fatigue: Secondary | ICD-10-CM

## 2012-05-15 LAB — BASIC METABOLIC PANEL
BUN: 21 mg/dL (ref 6–23)
CO2: 26 mEq/L (ref 19–32)
Chloride: 103 mEq/L (ref 96–112)
Creatinine, Ser: 1.1 mg/dL (ref 0.4–1.5)
Glucose, Bld: 148 mg/dL — ABNORMAL HIGH (ref 70–99)

## 2012-05-18 ENCOUNTER — Telehealth: Payer: Self-pay | Admitting: Internal Medicine

## 2012-05-18 NOTE — Telephone Encounter (Signed)
See above in comments Labs normal Echo without signif change from previous echoes Concerned that his sudden worsening of fatigue, dyspnea represent an anginal equivalent.  Would recommend lexiscan myoview to rule out inducible ischemia.

## 2012-05-24 ENCOUNTER — Ambulatory Visit (HOSPITAL_COMMUNITY): Payer: 59 | Attending: Internal Medicine | Admitting: Radiology

## 2012-05-24 VITALS — BP 136/99 | Ht 71.0 in | Wt 268.0 lb

## 2012-05-24 DIAGNOSIS — I10 Essential (primary) hypertension: Secondary | ICD-10-CM | POA: Insufficient documentation

## 2012-05-24 DIAGNOSIS — R0989 Other specified symptoms and signs involving the circulatory and respiratory systems: Secondary | ICD-10-CM | POA: Insufficient documentation

## 2012-05-24 DIAGNOSIS — Z8249 Family history of ischemic heart disease and other diseases of the circulatory system: Secondary | ICD-10-CM | POA: Insufficient documentation

## 2012-05-24 DIAGNOSIS — R0602 Shortness of breath: Secondary | ICD-10-CM

## 2012-05-24 DIAGNOSIS — R0609 Other forms of dyspnea: Secondary | ICD-10-CM | POA: Insufficient documentation

## 2012-05-24 DIAGNOSIS — R5381 Other malaise: Secondary | ICD-10-CM | POA: Insufficient documentation

## 2012-05-24 MED ORDER — TECHNETIUM TC 99M SESTAMIBI GENERIC - CARDIOLITE
30.0000 | Freq: Once | INTRAVENOUS | Status: AC | PRN
  Administered 2012-05-24: 30 via INTRAVENOUS

## 2012-05-24 MED ORDER — TECHNETIUM TC 99M SESTAMIBI GENERIC - CARDIOLITE
10.0000 | Freq: Once | INTRAVENOUS | Status: AC | PRN
  Administered 2012-05-24: 10 via INTRAVENOUS

## 2012-05-24 MED ORDER — REGADENOSON 0.4 MG/5ML IV SOLN
0.4000 mg | Freq: Once | INTRAVENOUS | Status: AC
  Administered 2012-05-24: 0.4 mg via INTRAVENOUS

## 2012-05-24 NOTE — Progress Notes (Signed)
[facility] SITE 3 NUCLEAR MED [address] [place] [address] [phone]  Cardiology Nuclear Med Study  Chase Gutierrez is a 61 y.o. male     MRN : [id number]     DOB: Feb 22, 1951  Procedure Date: 05/24/2012  Nuclear Med Background Indication for Stress Test:  Evaluation for Ischemia History:  2013 Echo EF 60-65%, 2010 AVR Cardiac Risk Factors: Family History - CAD, Hypertension and Lipids  Symptoms:  DOE and Fatigue   Nuclear Pre-Procedure Caffeine/Decaff Intake:  None NPO After: 11:00pm   Lungs:  clear O2 Sat: 98% on room air. IV 0.9% NS with Angio Cath:  20g  IV Site: R Forearm  IV Started by:  Stanton Kidney, EMT-P  Chest Size (in):  48 Cup Size: n/a  Height: 5\' 11"  (1.803 m)  Weight:  268 lb (121.564 kg)  BMI:  Body mass index is 37.38 kg/(m^2). Tech Comments:  Bystolic held > 36 hours, per patient.    Nuclear Med Study 1 or 2 day study: 1 day  Stress Test Type:  Lexiscan  Reading MD: Willa Rough, MD  Order Authorizing Provider:  Dietrich Pates, MD  Resting Radionuclide: Technetium 72m Sestamibi  Resting Radionuclide Dose: 11.0 mCi   Stress Radionuclide:  Technetium 15m Sestamibi  Stress Radionuclide Dose: 33.0 mCi           Stress Protocol Rest HR: 64 Stress HR: 80  Rest BP: 136/99 Stress BP: 154/92  Exercise Time (min): n/a METS: n/a   Predicted Max HR: 161 bpm % Max HR: 49.69 bpm Rate Pressure Product: 21308   Dose of Adenosine (mg):  n/a Dose of Lexiscan: 0.4 mg  Dose of Atropine (mg): n/a Dose of Dobutamine: n/a mcg/kg/min (at max HR)  Stress Test Technologist: Bonnita Levan, RN  Nuclear Technologist:  Doyne Keel, CNMT     Rest Procedure:  Myocardial perfusion imaging was performed at rest 45 minutes following the intravenous administration of Technetium 29m Sestamibi. Rest ECG: Sinus Rhythm with T wave abnormality  Stress Procedure:  The patient received IV Lexiscan 0.4 mg over 15-seconds.  Technetium 82m Sestamibi injected at  30-seconds.  There were no significant changes with Lexiscan.  Quantitative spect images were obtained after a 45 minute delay. Stress ECG: No significant ST segment change suggestive of ischemia.  QPS Raw Data Images:  Patient motion noted; appropriate software correction applied. Stress Images:  Normal homogeneous uptake in all areas of the myocardium. Rest Images:  Normal homogeneous uptake in all areas of the myocardium. Subtraction (SDS):  No evidence of ischemia. Transient Ischemic Dilatation (Normal <1.22):  1.09 Lung/Heart Ratio (Normal <0.45):  0.31  Quantitative Gated Spect Images QGS EDV:  125 ml QGS ESV:  57 ml  Impression Exercise Capacity:  Lexiscan with no exercise. BP Response:  Normal blood pressure response. Clinical Symptoms:  mild chest discomfort ECG Impression:  No significant ST segment change suggestive of ischemia. Comparison with Prior Nuclear Study: No images to compare  Overall Impression:  There is no definite scar or ischemia. Wall motion assessment suggest some dyssynergy of the septum.  LV Ejection Fraction: 54%.  LV Wall Motion:  There is mild dyssynergy of the septum.  Willa Rough, MD

## 2012-06-16 ENCOUNTER — Other Ambulatory Visit: Payer: Self-pay | Admitting: Hematology & Oncology

## 2012-06-16 ENCOUNTER — Encounter: Payer: Self-pay | Admitting: Hematology & Oncology

## 2012-06-16 DIAGNOSIS — D751 Secondary polycythemia: Secondary | ICD-10-CM

## 2012-06-16 DIAGNOSIS — D45 Polycythemia vera: Secondary | ICD-10-CM

## 2012-06-16 HISTORY — DX: Polycythemia vera: D45

## 2012-06-19 ENCOUNTER — Telehealth: Payer: Self-pay | Admitting: Hematology & Oncology

## 2012-06-19 NOTE — Telephone Encounter (Signed)
Pt aware of 10-28 °

## 2012-06-26 ENCOUNTER — Ambulatory Visit (HOSPITAL_BASED_OUTPATIENT_CLINIC_OR_DEPARTMENT_OTHER): Payer: 59 | Admitting: Medical

## 2012-06-26 ENCOUNTER — Ambulatory Visit (HOSPITAL_BASED_OUTPATIENT_CLINIC_OR_DEPARTMENT_OTHER): Payer: 59

## 2012-06-26 ENCOUNTER — Other Ambulatory Visit (HOSPITAL_BASED_OUTPATIENT_CLINIC_OR_DEPARTMENT_OTHER): Payer: 59 | Admitting: Lab

## 2012-06-26 VITALS — BP 153/82 | HR 67 | Temp 98.0°F | Resp 18 | Ht 71.0 in | Wt 270.0 lb

## 2012-06-26 DIAGNOSIS — D45 Polycythemia vera: Secondary | ICD-10-CM

## 2012-06-26 DIAGNOSIS — D751 Secondary polycythemia: Secondary | ICD-10-CM

## 2012-06-26 LAB — CBC WITH DIFFERENTIAL (CANCER CENTER ONLY)
BASO#: 0.2 10*3/uL (ref 0.0–0.2)
BASO%: 2.5 % — ABNORMAL HIGH (ref 0.0–2.0)
Eosinophils Absolute: 0.4 10*3/uL (ref 0.0–0.5)
HCT: 42 % (ref 38.7–49.9)
HGB: 13.5 g/dL (ref 13.0–17.1)
LYMPH#: 1.8 10*3/uL (ref 0.9–3.3)
LYMPH%: 25.5 % (ref 14.0–48.0)
MCV: 79 fL — ABNORMAL LOW (ref 82–98)
MONO#: 0.6 10*3/uL (ref 0.1–0.9)
NEUT%: 57.5 % (ref 40.0–80.0)
RBC: 5.34 10*6/uL (ref 4.20–5.70)
RDW: 14.2 % (ref 11.1–15.7)
WBC: 7.2 10*3/uL (ref 4.0–10.0)

## 2012-06-26 LAB — COMPREHENSIVE METABOLIC PANEL
ALT: 33 U/L (ref 0–53)
AST: 27 U/L (ref 0–37)
Albumin: 4.3 g/dL (ref 3.5–5.2)
BUN: 23 mg/dL (ref 6–23)
Calcium: 9.5 mg/dL (ref 8.4–10.5)
Chloride: 101 mEq/L (ref 96–112)
Potassium: 4.4 mEq/L (ref 3.5–5.3)

## 2012-06-26 NOTE — Progress Notes (Signed)
Diagnosis: Polycythemia vera, JAK 2 negative  Current therapy: Phlebotomy to maintain hematocrit below 42  Interim history: Chase Gutierrez presents today for an office followup visit.  Overall, he, reports, that he is doing relatively well except for continued chronic fatigue.  He did followup with his cardiologist, who performed an echo without any significant change from his previous echo.  He's not reporting any chest pain, or significant change in shortness of breath.  He does have some dyspnea on exertion.  He does sleep with a CPAP every night.  He is not reporting any type of headaches or visual changes.  He's not reporting any type of joint pain.  He has not had any changes in his medications.  He is on thyroid medication and in his last TSH was within normal range.  He, reports, he has a good appetite.  He denies any nausea, vomiting, diarrhea, constipation, any cough, rashes, dysphasia, odynophagia.  He denies any fevers, chills, or night sweats.  He denies any obvious, or abnormal bleeding.  His hematocrit is 42.  Today.  We will go ahead and phlebotomize him.  His last ferritin back on 05/08/2012 was 9.  Review of Systems: Pt. Denies any changes in their vision, hearing, adenopathy, fevers, chills, nausea, vomiting, diarrhea, constipation, chest pain, shortness of breath, passing blood, passing out, blacking out,  any changes in skin, joints, neurologic or psychiatric except as noted.  Physical Exam: This is a pleasant, moderately obese, 61 year old, white gentleman, in no obvious distress Vitals: Temperature 98.0 degrees, pulse 67, respiration 18, blood pressure 153 DT, weight 270 pounds HEENT reveals a normocephalic, atraumatic skull, no scleral icterus, no oral lesions  Neck is supple without any cervical or supraclavicular adenopathy.  Lungs are clear to auscultation bilaterally. There are no wheezes, rales or rhonci Cardiac is regular rate and rhythm with a normal S1 and S2. There are no  murmurs, rubs, or bruits.  Abdomen is soft with good bowel sounds, there is no palpable mass. There is no palpable hepatosplenomegaly. There is no palpable fluid wave.  Musculoskeletal no tenderness of the spine, ribs, or hips.  Extremities there are no clubbing, cyanosis, or edema.  Skin no petechia, purpura or ecchymosis Neurologic is nonfocal.  Laboratory Data: White count 7.2, hemoglobin 13.5, hematocrit 42.0, platelets 196,000  Current Outpatient Prescriptions on File Prior to Visit  Medication Sig Dispense Refill  . amitriptyline (ELAVIL) 25 MG tablet at bedtime as needed.       . Armodafinil (NUVIGIL) 150 MG tablet Take 150 mg by mouth daily.       Marland Kitchen aspirin 81 MG EC tablet Take 1 tablet (81 mg total) by mouth daily. Swallow whole.  30 tablet  12  . CYMBALTA 60 MG capsule 60 mg daily.       . hydrochlorothiazide (HYDRODIURIL) 12.5 MG tablet Take 12.5 mg by mouth daily.        Marland Kitchen ibuprofen (ADVIL,MOTRIN) 400 MG tablet Take 400 mg by mouth as needed.        Marland Kitchen levothyroxine (SYNTHROID) 25 MCG tablet Take 25 mcg by mouth daily.        Marland Kitchen lisinopril (PRINIVIL,ZESTRIL) 20 MG tablet 10 mg daily.       . Multiple Vitamin (MULTIVITAMIN) capsule Take 1 capsule by mouth daily.        . nebivolol (BYSTOLIC) 5 MG tablet 20 mg daily.       Marland Kitchen VICTOZA 18 MG/3ML SOLN every morning.  Assessment/Plan: This is a 61 year old, gentleman, with the following issues:  #1 polycythemia vera, JAK 2 negative.  He does well with our phlebotomies.  We will try and keep Chase Gutierrez, hematocrit below 42.  We will continue to follow him closely.  #2 followup.  We will follow back up with Chase Gutierrez in 4 weeks, but before then should there be questions or concerns.

## 2012-06-26 NOTE — Progress Notes (Signed)
Chase Gutierrez presents today for phlebotomy per MD orders. Phlebotomy procedure started at 0944and ended at 0949. Approximately 500 mls removed. Patient observed for 30 minutes after procedure without any incident. Patient tolerated procedure well. IV needle removed intact. Teola Bradley, Luigi Stuckey [employer]

## 2012-06-26 NOTE — Patient Instructions (Addendum)

## 2012-07-17 ENCOUNTER — Ambulatory Visit (HOSPITAL_COMMUNITY): Payer: 59 | Attending: Internal Medicine

## 2012-07-17 ENCOUNTER — Telehealth: Payer: Self-pay | Admitting: Internal Medicine

## 2012-07-17 DIAGNOSIS — R079 Chest pain, unspecified: Secondary | ICD-10-CM | POA: Insufficient documentation

## 2012-07-17 DIAGNOSIS — R5381 Other malaise: Secondary | ICD-10-CM | POA: Insufficient documentation

## 2012-07-17 DIAGNOSIS — R5383 Other fatigue: Secondary | ICD-10-CM | POA: Insufficient documentation

## 2012-07-17 NOTE — Telephone Encounter (Signed)
Pt needs order in system for CPX

## 2012-07-24 ENCOUNTER — Ambulatory Visit (HOSPITAL_BASED_OUTPATIENT_CLINIC_OR_DEPARTMENT_OTHER): Payer: 59

## 2012-07-24 ENCOUNTER — Other Ambulatory Visit (HOSPITAL_BASED_OUTPATIENT_CLINIC_OR_DEPARTMENT_OTHER): Payer: 59 | Admitting: Lab

## 2012-07-24 ENCOUNTER — Ambulatory Visit (HOSPITAL_BASED_OUTPATIENT_CLINIC_OR_DEPARTMENT_OTHER): Payer: 59 | Admitting: Hematology & Oncology

## 2012-07-24 VITALS — BP 143/83 | HR 68 | Temp 98.0°F | Resp 20 | Ht 71.0 in | Wt 269.0 lb

## 2012-07-24 DIAGNOSIS — D45 Polycythemia vera: Secondary | ICD-10-CM

## 2012-07-24 DIAGNOSIS — D509 Iron deficiency anemia, unspecified: Secondary | ICD-10-CM

## 2012-07-24 LAB — CBC WITH DIFFERENTIAL (CANCER CENTER ONLY)
BASO%: 3 % — ABNORMAL HIGH (ref 0.0–2.0)
EOS%: 6 % (ref 0.0–7.0)
LYMPH#: 1.7 10*3/uL (ref 0.9–3.3)
MCHC: 32.6 g/dL (ref 32.0–35.9)
NEUT#: 4.3 10*3/uL (ref 1.5–6.5)
Platelets: 222 10*3/uL (ref 145–400)
RDW: 14.9 % (ref 11.1–15.7)

## 2012-07-24 LAB — COMPREHENSIVE METABOLIC PANEL
ALT: 36 U/L (ref 0–53)
BUN: 18 mg/dL (ref 6–23)
CO2: 26 mEq/L (ref 19–32)
Calcium: 9.7 mg/dL (ref 8.4–10.5)
Chloride: 106 mEq/L (ref 96–112)
Creatinine, Ser: 1.16 mg/dL (ref 0.50–1.35)
Glucose, Bld: 148 mg/dL — ABNORMAL HIGH (ref 70–99)
Total Bilirubin: 0.3 mg/dL (ref 0.3–1.2)

## 2012-07-24 LAB — CHCC SATELLITE - SMEAR

## 2012-07-24 LAB — FERRITIN: Ferritin: 10 ng/mL — ABNORMAL LOW (ref 22–322)

## 2012-07-24 NOTE — Progress Notes (Signed)
This office note has been dictated.

## 2012-07-24 NOTE — Patient Instructions (Signed)
Therapeutic Phlebotomy Therapeutic phlebotomy is the controlled removal of blood from your body for the purpose of treating a medical condition. It is similar to donating blood. Usually, about a pint (470 mL) of blood is removed. The average adult has 9 to 12 pints (4.3 to 5.7 L) of blood. Therapeutic phlebotomy may be used to treat the following medical conditions:  Hemochromatosis. This is a condition in which there is too much iron in the blood.  Polycythemia vera. This is a condition in which there are too many red cells in the blood.  Porphyria cutanea tarda. This is a disease usually passed from one generation to the next (inherited). It is a condition in which an important part of hemoglobin is not made properly. This results in the build up of abnormal amounts of porphyrins in the body.  Sickle cell disease. This is an inherited disease. It is a condition in which the red blood cells form an abnormal crescent shape rather than a round shape. LET YOUR CAREGIVER KNOW ABOUT:  Allergies.  Medicines taken including herbs, eyedrops, over-the-counter medicines, and creams.  Use of steroids (by mouth or creams).  Previous problems with anesthetics or numbing medicine.  History of blood clots.  History of bleeding or blood problems.  Previous surgery.  Possibility of pregnancy, if this applies. RISKS AND COMPLICATIONS This is a simple and safe procedure. Problems are unlikely. However, problems can occur and may include:  Nausea or lightheadedness.  Low blood pressure.  Soreness, bleeding, swelling, or bruising at the needle insertion site.  Infection. BEFORE THE PROCEDURE  This is a procedure that can be done as an outpatient. Confirm the time that you need to arrive for your procedure. Confirm whether there is a need to fast or withhold any medications. It is helpful to wear clothing with sleeves that can be raised above the elbow. A blood sample may be done to determine the  amount of red blood cells or iron in your blood. Plan ahead of time to have someone drive you home after the procedure. PROCEDURE The entire procedure from preparation through recovery takes about 1 hour. The actual collection takes about 10 to 15 minutes.  A needle will be inserted into your vein.  Tubing and a collection bag will be attached to that needle.  Blood will flow through the needle and tubing into the collection bag.  You may be asked to open and close your hand slowly and continuously during the entire collection.  Once the specified amount of blood has been removed from your body, the collection bag and tubing will be clamped.  The needle will be removed.  Pressure will be held on the site of the needle insertion to stop the bleeding. Then a bandage will be placed over the needle insertion site. AFTER THE PROCEDURE  Your recovery will be assessed and monitored. If there are no problems, as an outpatient, you should be able to go home shortly after the procedure.  Document Released: 01/18/2011 Document Revised: 11/08/2011 Document Reviewed: 01/18/2011 [employer] Patient Information 2013 [employer].  

## 2012-07-24 NOTE — Progress Notes (Signed)
Chase Gutierrez presents today for phlebotomy per MD orders. Phlebotomy procedure started at 0915 and ended at 0930. 500 grams removed. Patient observed for 30 minutes after procedure without any incident. Patient tolerated procedure well. IV needle removed intact.   

## 2012-07-25 NOTE — Progress Notes (Signed)
CC:   [name], M.D. Pricilla Riffle, MD, [facility]  DIAGNOSIS:  Polycythemia vera, JAK2 negative.  CURRENT THERAPY:  Phlebotomy to maintain hematocrit below 40%.  INTERIM HISTORY:  [name] comes in for his followup.  He is still having an issue with blood pressure and heart problems.  He sees Dr. Dietrich Pates.  He is going to be going on disability because of these issues.  We are trying to be aggressive with his phlebotomy program.  By keeping his hematocrit down, I think this helps with his blood pressure and takes stress off his heart.  He does have sleep apnea.  He does use a CPAP machine.  This also is contributing to the polycythemia.  We are making him iron deficient when we phlebotomize him, we are giving iron now.  With his last visit in October, his ferritin was 12.  PHYSICAL EXAMINATION:  General:  This is an obese white gentleman in no obvious distress.  Vital signs:  98.7, pulse 68, respiratory rate 18, blood pressure 140/82.  Weight is 269.  Head and neck:  Shows a normocephalic, atraumatic skull.  There are no ocular or oral lesions. There are no palpable cervical or supraclavicular lymph nodes.  Lungs: Clear bilaterally.  Cardiac:  Regular rate and rhythm with a normal S1, S2.  There is a 2/6 systolic ejection murmur.  Abdomen:  Soft with good bowel sounds.  He is mildly obese.  He has no fluid wave.  There is no palpable hepatosplenomegaly.  Back:  No tenderness over the spine, ribs or hips.  Extremities:  Shows no clubbing, cyanosis or edema.  LAB STUDIES:  White cell count is 7.2, hemoglobin 13.2, hematocrit 40.5, platelet count 222.  IMPRESSION:  Chase Gutierrez is a 61 year old gentleman with polycythemia vera.  We are being aggressive with his phlebotomy program.  We will go ahead and phlebotomize him today.  Again, I want to try to keep his hematocrit below 40.  He says he feels better after phlebotomies.  We will plan to get him back in another  month.  I just want to try to get him through the holidays so he feels well.   ______________________________ Josph Macho, M.D. PRE/MEDQ  D:  07/24/2012  T:  07/25/2012  Job:  [id number]

## 2012-08-18 ENCOUNTER — Telehealth: Payer: Self-pay | Admitting: Internal Medicine

## 2012-08-18 NOTE — Telephone Encounter (Signed)
I will forward note to Dr Tenny Craw to enquire about paper work filled out.  Read staff message.

## 2012-08-18 NOTE — Telephone Encounter (Signed)
[employer]- Employee HR  [phone]  plz return call to pt [employer]ployee HR rep regarding documentation that was sent in  For patient.

## 2012-08-21 ENCOUNTER — Ambulatory Visit (HOSPITAL_BASED_OUTPATIENT_CLINIC_OR_DEPARTMENT_OTHER): Payer: 59

## 2012-08-21 ENCOUNTER — Ambulatory Visit (HOSPITAL_BASED_OUTPATIENT_CLINIC_OR_DEPARTMENT_OTHER): Payer: 59 | Admitting: Medical

## 2012-08-21 ENCOUNTER — Other Ambulatory Visit (HOSPITAL_BASED_OUTPATIENT_CLINIC_OR_DEPARTMENT_OTHER): Payer: 59 | Admitting: Lab

## 2012-08-21 VITALS — BP 120/65 | HR 62 | Temp 97.6°F | Resp 18 | Ht 71.0 in | Wt 267.0 lb

## 2012-08-21 DIAGNOSIS — D45 Polycythemia vera: Secondary | ICD-10-CM

## 2012-08-21 LAB — CBC WITH DIFFERENTIAL (CANCER CENTER ONLY)
BASO#: 0.2 10*3/uL (ref 0.0–0.2)
Eosinophils Absolute: 0.4 10*3/uL (ref 0.0–0.5)
HGB: 12.7 g/dL — ABNORMAL LOW (ref 13.0–17.1)
LYMPH%: 24.3 % (ref 14.0–48.0)
MCH: 24.5 pg — ABNORMAL LOW (ref 28.0–33.4)
MCV: 78 fL — ABNORMAL LOW (ref 82–98)
MONO%: 9.2 % (ref 0.0–13.0)
NEUT%: 57.9 % (ref 40.0–80.0)
Platelets: 219 10*3/uL (ref 145–400)
RBC: 5.18 10*6/uL (ref 4.20–5.70)

## 2012-08-21 NOTE — Progress Notes (Signed)
Chase Gutierrez presents today for phlebotomy per MD orders. Phlebotomy procedure started at 1000 and ended at 1015. 500 grams removed. Patient observed for 30 minutes after procedure without any incident. Patient tolerated procedure well. IV needle removed intact.

## 2012-08-21 NOTE — Progress Notes (Signed)
Diagnosis: Polycythemia vera, JAK 2 negative.  Current therapy: Phlebotomy to maintain hematocrit below 40%.  Interim history: Chase Gutierrez presents today for an office followup visit.  Overall, he, reports, that he's been doing quite well.  He did have to have another antihypertensive added to his blood pressure regimen.  He is actually going on retirement at the end of January, secondary to his disability. We have been on a pretty aggressive phlebotomy regimen with Chase Gutierrez.  He has been getting phlebotomies almost monthly, now.  He seems to do better with this.  I also think that it helps with his blood pressure and takes.  The stress of his heart.  He does have sleep apnea.  He does use his CPAP machine.  This is also contributing to the, polycythemia.  We are making him iron deficient.  His last ferritin was 10.  He, reports, that he has a good appetite.  He denies any nausea, vomiting, diarrhea, constipation, chest pain, shortness of breath, or cough.  He denies any fevers, chills, or night sweats.  He denies any obvious, or abnormal bleeding.  He denies any lower leg swelling.  He denies any headaches, visual changes, or rashes.  Review of Systems: Constitutional:Negative for malaise/fatigue, fever, chills, weight loss, diaphoresis, activity change, appetite change, and unexpected weight change.  HEENT: Negative for double vision, blurred vision, visual loss, ear pain, tinnitus, congestion, rhinorrhea, epistaxis sore throat or sinus disease, oral pain/lesion, tongue soreness Respiratory: Negative for cough, chest tightness, shortness of breath, wheezing and stridor.  Cardiovascular: Negative for chest pain, palpitations, leg swelling, orthopnea, PND, DOE or claudication Gastrointestinal: Negative for nausea, vomiting, abdominal pain, diarrhea, constipation, blood in stool, melena, hematochezia, abdominal distention, anal bleeding, rectal pain, anorexia and hematemesis.  Genitourinary: Negative  for dysuria, frequency, hematuria,  Musculoskeletal: Negative for myalgias, back pain, joint swelling, arthralgias and gait problem.  Skin: Negative for rash, color change, pallor and wound.  Neurological:. Negative for dizziness/light-headedness, tremors, seizures, syncope, facial asymmetry, speech difficulty, weakness, numbness, headaches and paresthesias.  Hematological: Negative for adenopathy. Does not bruise/bleed easily.  Psychiatric/Behavioral:  Negative for depression, no loss of interest in normal activity or change in sleep pattern.   Physical Exam: This is an obese, 61 year old, white gentleman, in no obvious distress Vitals: Temperature 97.6 degrees, pulse 62, respirations 18, blood pressure 120/65.  Weight 267 pounds HEENT reveals a normocephalic, atraumatic skull, no scleral icterus, no oral lesions  Neck is supple without any cervical or supraclavicular adenopathy.  Lungs are clear to auscultation bilaterally. There are no wheezes, rales or rhonci Cardiac is regular rate and rhythm with a normal S1 and S2. There are no murmurs, rubs, or bruits.  Abdomen is soft with good bowel sounds, there is no palpable mass. There is no palpable hepatosplenomegaly. There is no palpable fluid wave.  Musculoskeletal no tenderness of the spine, ribs, or hips.  Extremities there are no clubbing, cyanosis, or edema.  Skin no petechia, purpura or ecchymosis Neurologic is nonfocal.  Laboratory Data: White count 6.9, hemoglobin 12.7, hematocrit 40.2, MCV 78, platelets 219,000  Current Outpatient Prescriptions on File Prior to Visit  Medication Sig Dispense Refill  . amitriptyline (ELAVIL) 25 MG tablet at bedtime as needed.       Marland Kitchen aspirin 81 MG EC tablet Take 1 tablet (81 mg total) by mouth daily. Swallow whole.  30 tablet  12  . CYMBALTA 60 MG capsule 60 mg daily.       . hydrochlorothiazide (HYDRODIURIL) 12.5 MG tablet  Take 12.5 mg by mouth daily.        Marland Kitchen ibuprofen (ADVIL,MOTRIN) 400 MG  tablet Take 400 mg by mouth as needed.        Marland Kitchen levothyroxine (SYNTHROID) 25 MCG tablet Take 25 mcg by mouth daily.        . Multiple Vitamin (MULTIVITAMIN) capsule Take 1 capsule by mouth daily.        . naproxen (NAPROSYN) 500 MG tablet Take 250 mg by mouth as needed.       . nebivolol (BYSTOLIC) 5 MG tablet 20 mg daily.       Marland Kitchen NUVIGIL 250 MG tablet Take 250 mg by mouth as needed.       Marland Kitchen VICTOZA 18 MG/3ML SOLN every morning.        Assessment/Plan: This is a 61 year old, white gentleman, with the following issues:   #1.  polycythemia vera. He is JAK 2 negative.  We are being fairly aggressive with his phlebotomy program.  We will go ahead and phlebotomize him.  Today.  Again, we try to keep his hematocrit below 40.  #2.  Hypertension.  He will continue to followup with Dr. Clelia Croft and Dr. Tenny Craw  #3.  Followup.  We will follow back up with Chase Gutierrez in one month, but before then should there be questions or concerns.

## 2012-09-20 ENCOUNTER — Other Ambulatory Visit: Payer: 59 | Admitting: Lab

## 2012-09-20 ENCOUNTER — Telehealth: Payer: Self-pay | Admitting: Hematology & Oncology

## 2012-09-20 ENCOUNTER — Ambulatory Visit: Payer: 59 | Admitting: Hematology & Oncology

## 2012-09-20 NOTE — Telephone Encounter (Signed)
Pt moved 1-22 to 1-31 and 2-6

## 2012-09-29 ENCOUNTER — Telehealth: Payer: Self-pay | Admitting: Hematology & Oncology

## 2012-09-29 ENCOUNTER — Other Ambulatory Visit (HOSPITAL_BASED_OUTPATIENT_CLINIC_OR_DEPARTMENT_OTHER): Payer: 59 | Admitting: Lab

## 2012-09-29 ENCOUNTER — Ambulatory Visit (HOSPITAL_BASED_OUTPATIENT_CLINIC_OR_DEPARTMENT_OTHER): Payer: 59 | Admitting: Hematology & Oncology

## 2012-09-29 ENCOUNTER — Ambulatory Visit (HOSPITAL_BASED_OUTPATIENT_CLINIC_OR_DEPARTMENT_OTHER): Payer: 59

## 2012-09-29 VITALS — BP 104/64 | HR 55 | Resp 20

## 2012-09-29 VITALS — BP 121/65 | HR 55 | Temp 97.7°F | Resp 20 | Ht 71.0 in | Wt 272.0 lb

## 2012-09-29 DIAGNOSIS — D509 Iron deficiency anemia, unspecified: Secondary | ICD-10-CM

## 2012-09-29 DIAGNOSIS — D45 Polycythemia vera: Secondary | ICD-10-CM

## 2012-09-29 LAB — CBC WITH DIFFERENTIAL (CANCER CENTER ONLY)
BASO#: 0.2 10*3/uL (ref 0.0–0.2)
Eosinophils Absolute: 0.4 10*3/uL (ref 0.0–0.5)
HGB: 12 g/dL — ABNORMAL LOW (ref 13.0–17.1)
LYMPH%: 27.9 % (ref 14.0–48.0)
MCH: 23.4 pg — ABNORMAL LOW (ref 28.0–33.4)
MCV: 75 fL — ABNORMAL LOW (ref 82–98)
MONO#: 0.8 10*3/uL (ref 0.1–0.9)
MONO%: 8.9 % (ref 0.0–13.0)
NEUT#: 4.7 10*3/uL (ref 1.5–6.5)
RBC: 5.12 10*6/uL (ref 4.20–5.70)

## 2012-09-29 LAB — FERRITIN: Ferritin: 10 ng/mL — ABNORMAL LOW (ref 22–322)

## 2012-09-29 NOTE — Telephone Encounter (Signed)
Left pt message with 3-12 appointment. He will be in [place] until 10-30-12

## 2012-09-29 NOTE — Progress Notes (Signed)
This office note has been dictated.

## 2012-09-29 NOTE — Patient Instructions (Addendum)

## 2012-09-29 NOTE — Progress Notes (Signed)
Chase Gutierrez presents today for phlebotomy per MD orders. Phlebotomy procedure started at 1024 and ended at 1028. Approximately 500 mls removed. Patient observed for 30 minutes after procedure without any incident. Patient tolerated procedure well. IV needle removed intact.

## 2012-09-30 NOTE — Progress Notes (Signed)
CC:   [name], M.D.  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  CURRENT THERAPY:  Phlebotomy to maintain hematocrit below 40%.  INTERIM HISTORY:  Mr. Lanni comes in for followup.  He is doing better.  Today is his last day of work.  He will go out on retirement/disability.  He does have a new CPAP machine.  He has sleep apnea, and hopefully the new CPAP will help him.  He has been going to a Adult nurse.  He says this is helping his back quite a bit.  He is getting ready to go down to [place].  His wife is down there right now.  We will be down there for about 3 weeks.  He is still under a lot of stress.  Even though he is working part time, he had a lot of stress of work.  He has had no problems with fever.  He has had no headache.  There is no nausea or vomiting.  PHYSICAL EXAMINATION:  This is an obese white gentleman in no obvious distress.  Vital signs:  Temperature 97.7, pulse 55, respiratory rate 20, blood pressure 121/65.  Weight is 272.  Head/neck:  Normocephalic, atraumatic skull.  There are no ocular or oral lesions.  There are no palpable cervical or supraclavicular lymph nodes.  Lungs:  Clear bilaterally.  Cardiac:  Regular rate and rhythm with a normal S1, S2. There are no murmurs, rubs or bruits.  Abdomen:  Soft with good bowel sounds.  There is no palpable abdominal mass.  There is no palpable hepatosplenomegaly.  Back:  No tenderness over the spine, ribs, or hips. Extremities:  No clubbing, cyanosis or edema.  LABORATORY STUDIES:  White cell count is 8.5, hemoglobin 12, hematocrit 38.2, platelet count 223.  MCV is 75.  IMPRESSION:  Mr. Haub is a real nice 62 year old gentleman with polycythemia.  He really does well with a low hematocrit.  We will go ahead and phlebotomize him today.  We will do this so that he will feel more confident going down to [place] and not having problems down there.  We will plan to get him  back to see [place] in another 6 weeks or so.  We will be flexible with his phlebotomy program.  He is clearly iron deficient, so that is helping his blood count.    ______________________________ Josph Macho, M.D. PRE/MEDQ  D:  09/29/2012  T:  09/30/2012  Job:  [id number]

## 2012-11-07 ENCOUNTER — Other Ambulatory Visit: Payer: Self-pay | Admitting: Medical

## 2012-11-08 ENCOUNTER — Ambulatory Visit (HOSPITAL_BASED_OUTPATIENT_CLINIC_OR_DEPARTMENT_OTHER): Payer: 59

## 2012-11-08 ENCOUNTER — Ambulatory Visit: Payer: 59 | Admitting: Medical

## 2012-11-08 ENCOUNTER — Ambulatory Visit (HOSPITAL_BASED_OUTPATIENT_CLINIC_OR_DEPARTMENT_OTHER): Payer: 59 | Admitting: Lab

## 2012-11-08 VITALS — BP 95/63 | HR 73 | Temp 97.5°F | Resp 18

## 2012-11-08 DIAGNOSIS — D45 Polycythemia vera: Secondary | ICD-10-CM

## 2012-11-08 LAB — CBC WITH DIFFERENTIAL (CANCER CENTER ONLY)
BASO#: 0.2 10*3/uL (ref 0.0–0.2)
Eosinophils Absolute: 0.3 10*3/uL (ref 0.0–0.5)
HGB: 13 g/dL (ref 13.0–17.1)
LYMPH#: 1.8 10*3/uL (ref 0.9–3.3)
MCH: 23.1 pg — ABNORMAL LOW (ref 28.0–33.4)
NEUT#: 6.6 10*3/uL — ABNORMAL HIGH (ref 1.5–6.5)
RBC: 5.62 10*6/uL (ref 4.20–5.70)

## 2012-11-08 NOTE — Progress Notes (Signed)
Chase Gutierrez presents today for phlebotomy per MD orders. Phlebotomy procedure started at 1430 and ended at 1438. 500 ml removed. Patient observed for 30 minutes after procedure without any incident. Patient tolerated procedure well. IV needle removed intact.

## 2012-11-08 NOTE — Patient Instructions (Signed)

## 2012-12-19 ENCOUNTER — Telehealth: Payer: Self-pay | Admitting: Hematology & Oncology

## 2012-12-19 NOTE — Telephone Encounter (Signed)
Pt cx 4-24 in [place], will call when he gets back to reschedule. Transferred call to RN he had questions about if he needed phlebotomy while he is there.

## 2012-12-21 ENCOUNTER — Ambulatory Visit: Payer: 59 | Admitting: Hematology & Oncology

## 2012-12-21 ENCOUNTER — Other Ambulatory Visit: Payer: 59 | Admitting: Lab

## 2013-01-24 ENCOUNTER — Telehealth: Payer: Self-pay | Admitting: Hematology & Oncology

## 2013-01-24 NOTE — Telephone Encounter (Signed)
Pt made 6-2 appointment

## 2013-01-29 ENCOUNTER — Other Ambulatory Visit (HOSPITAL_BASED_OUTPATIENT_CLINIC_OR_DEPARTMENT_OTHER): Payer: 59 | Admitting: Lab

## 2013-01-29 ENCOUNTER — Other Ambulatory Visit: Payer: Self-pay | Admitting: *Deleted

## 2013-01-29 ENCOUNTER — Ambulatory Visit (HOSPITAL_BASED_OUTPATIENT_CLINIC_OR_DEPARTMENT_OTHER): Payer: 59 | Admitting: Hematology & Oncology

## 2013-01-29 ENCOUNTER — Ambulatory Visit (HOSPITAL_BASED_OUTPATIENT_CLINIC_OR_DEPARTMENT_OTHER): Payer: 59

## 2013-01-29 ENCOUNTER — Telehealth: Payer: Self-pay | Admitting: Hematology & Oncology

## 2013-01-29 VITALS — BP 128/75 | HR 72

## 2013-01-29 VITALS — BP 113/67 | HR 71 | Temp 97.6°F | Resp 18 | Ht 73.0 in | Wt 250.0 lb

## 2013-01-29 DIAGNOSIS — D509 Iron deficiency anemia, unspecified: Secondary | ICD-10-CM

## 2013-01-29 DIAGNOSIS — D45 Polycythemia vera: Secondary | ICD-10-CM

## 2013-01-29 DIAGNOSIS — E119 Type 2 diabetes mellitus without complications: Secondary | ICD-10-CM

## 2013-01-29 LAB — CBC WITH DIFFERENTIAL (CANCER CENTER ONLY)
Eosinophils Absolute: 0.5 10*3/uL (ref 0.0–0.5)
HCT: 41.1 % (ref 38.7–49.9)
HGB: 12.9 g/dL — ABNORMAL LOW (ref 13.0–17.1)
LYMPH%: 17.8 % (ref 14.0–48.0)
MCV: 73 fL — ABNORMAL LOW (ref 82–98)
MONO#: 0.7 10*3/uL (ref 0.1–0.9)
NEUT%: 64.3 % (ref 40.0–80.0)
RBC: 5.64 10*6/uL (ref 4.20–5.70)
WBC: 7.9 10*3/uL (ref 4.0–10.0)

## 2013-01-29 MED ORDER — ALPRAZOLAM 0.5 MG PO TABS: 1.0000 mg | ORAL_TABLET | Freq: Two times a day (BID) | ORAL | Status: DC

## 2013-01-29 NOTE — Progress Notes (Signed)
Radene Gunning presents today for phlebotomy per MD orders. Phlebotomy procedure started at 0915 and ended at 0930. 500 grams removed. Patient observed for 30 minutes after procedure without any incident. Patient tolerated procedure well. IV needle removed intact.

## 2013-01-29 NOTE — Patient Instructions (Signed)

## 2013-01-29 NOTE — Progress Notes (Signed)
This office note has been dictated.

## 2013-01-29 NOTE — Telephone Encounter (Signed)
Left pt message with 7-1 appointment.  °

## 2013-01-31 NOTE — Progress Notes (Signed)
CC:   [name], M.D.  DIAGNOSIS:  Polycythemia vera-JAK2 negative.  CURRENT THERAPY:  Phlebotomy to maintain hematocrit below 40%.  INTERIM HISTORY:  Chase Gutierrez comes in for his followup.  He actually has been down in [place] for a couple of months.  He was being seen at an integrative clinic down there for neuropathy.  He was given injections into his legs.  He feels that they are working.  He is also diabetic now.  He has lost 22 pounds.  Hopefully, he will continue to lose some weight.  He still has sleep apnea.  He has a CPAP machine at home that he uses.  He is not working.  He really is not able to work secondary to all the stress in his life and to his health issues.  He says that he has felt like he is doing better now that we have phlebotomized him.  He just feels that he is able to do a little bit more and does not get as fatigued or mentally drained.  PHYSICAL EXAMINATION:  General:  This is a moderately obese white gentleman in no obvious distress.  Vital signs:  Temperature of 97.6, pulse 71, respiratory rate 18, blood pressure 113/67.  Weight is 250. Head and neck:  Normocephalic, atraumatic skull.  There are no ocular or oral lesions.  There are no palpable cervical or supraclavicular lymph nodes.  Lungs:  Clear bilaterally.  Cardiac:  Regular rate and rhythm with a normal S1 and S2.  There are no murmurs, rubs, or bruits. Abdomen:  Soft with good bowel sounds.  There is no palpable abdominal mass.  There is no fluid wave.  There is no palpable hepatosplenomegaly. Back:  No tenderness over the spine, ribs, or hips.  Extremities:  No clubbing, cyanosis, or edema.  Neurological:  No focal neurological deficits.  LABORATORY STUDIES:  White cell count is 7.9, hemoglobin 12.9, hematocrit 41.1, platelet count 341.  IMPRESSION:  Chase Gutierrez is a very nice 62 year old gentleman with polycythemia.  Again, he requires an aggressive phlebotomy program as  he seems to function best with his hematocrit below 40%.  We will go ahead and phlebotomize him today.  He is iron-deficient.  His last ferritin was only 10 back in March.  I want to see him back probably in about 4 weeks or so.  He will apparently be heading down to [place] again in July and I want to make sure that we optimize his blood and his overall functional state before he goes down.    ______________________________ Chase Gutierrez, M.D. PRE/MEDQ  D:  01/29/2013  T:  01/30/2013  Job:  [id number]

## 2013-02-12 ENCOUNTER — Ambulatory Visit (INDEPENDENT_AMBULATORY_CARE_PROVIDER_SITE_OTHER): Payer: 59 | Admitting: Neurology

## 2013-02-12 ENCOUNTER — Encounter: Payer: Self-pay | Admitting: Neurology

## 2013-02-12 VITALS — BP 119/78 | HR 91 | Temp 98.4°F | Ht 71.0 in | Wt 244.0 lb

## 2013-02-12 DIAGNOSIS — G4733 Obstructive sleep apnea (adult) (pediatric): Secondary | ICD-10-CM

## 2013-02-12 DIAGNOSIS — E1142 Type 2 diabetes mellitus with diabetic polyneuropathy: Secondary | ICD-10-CM

## 2013-02-12 DIAGNOSIS — E1140 Type 2 diabetes mellitus with diabetic neuropathy, unspecified: Secondary | ICD-10-CM

## 2013-02-12 DIAGNOSIS — E1149 Type 2 diabetes mellitus with other diabetic neurological complication: Secondary | ICD-10-CM

## 2013-02-12 DIAGNOSIS — D751 Secondary polycythemia: Secondary | ICD-10-CM

## 2013-02-12 NOTE — Progress Notes (Signed)
[facility]  Provider:  Dr Jamaria Amborn Referring Provider: [name], MD Primary Care Physician:  [name], MD  Chief Complaint  Patient presents with  . Follow-up    cpap f/u RV 10    HPI:  Chase Gutierrez is a 62 y.o. male here as a referral from Dr. Clelia Croft for a sleep consultation. Chase Gutierrez is a right-handed, Caucasian, married male patient who carries a diagnosis of neuropathy, kidney stones, metabolic syndrome, gout, hypertension and ...obstructive sleep apnea. His past surgical history is remarkable for a heart valve replacement surgery in Feb. 2010 and a cardiac cath. .  The patient's bedtime is around midnight, he rises in the morning at about 7:30 AM and normally does so spontaneously without the help of an alarm being that. He estimates his nocturnal sleep time 6-7 hours. He has no nocturia, and recently has been sleeping through the night without any spontaneous arousals. The patient was just diagnosed with a diabetic neuropathy in March-April  2014  during his stay in [place] and has been begun treatment.  He has undergone a tonsillectomy and adenoid surgery in childhood and and has no trouble breathing through the nose. He developed nocturnal diaphoresis , sweating,  attributed to polycythemia, dx May 2013.  He lost 25 pounds in a prescribed diet  In [place] which eliminated alcohol, caffeine, gluten, pork, diary, all white sugars.     It was ihis cardiologist , Dr. Dietrich Pates, who referred him in 2006 for  the first time for a sleep study. The study was performed on 09-29-2005 AHI and RDI were at that time bundled into 19 events per hour. The patient was noted to have REM sleep accentuated apnea and severe, under is snoring. The patient was titrated on a full face mask and pressure was transiently increased to 17 cm water at the time ; however recommended  Was to use 14 cm water .  Apneas were obstructive in nature, there were no mixed apneas and  interestingly no hypopneas  The patient meanwhile owns 3 CPAP  sleep machines, one in each of his homes.  He is a compliant CPAP user he reports, Dr. Tenny Craw initiated a reevaluation for sleep apnea after the patient developed polycythemia, awoken the morning with swollen feet, and felt no longer that his sleep was refreshing. In the meantime he was seen in generally 2013 by Dr.Penumalli  for lower extremity numbness and diagnosed with a  neuropathy. His diabetic diagnoses followed the development of the neuropathy.    The patient was later  directly referred to [facility] CPAP, by  Dr. Clelia Croft and Dr. Tenny Craw and a split night polysomnography was performed on 08-09-12. The patient endorsed the Epworth sleepiness score at 13 pints but had in the office and doors before at 22 point. His sleepiness affected activities of daily living. The Beck's inventory was endorsed at 21 points.  At the time of the study the patient's weight was 269 pounds with a BMI of 32.7 the neck circumference was 17.5 inches.  The study revealed an AHI of 66.2 and an RDI of 69 point his oxygen saturation was lowest at 81% with a total of 57.5 minutes of desaturation.  The patient did not experience  any supine sleep nor REM sleep. He was then titrated to 8 cm water pressure with a sleep efficiency of 97.4 % , his  AHI was now 1.9 , the patient also received a prescription for resetting  his Respironics machine to 8 cm ,  nasal pillow  size and instructions for compliance with CPAP therapy.   Unfortunately, soon after the sleep study the patient moved back to [place] and  the patient returned from [place] at the end of May 2014 and is now here to the sleep consultation. He just received his Nuance Pro  nasal pillow and states that he not wakes up with headaches, but she didn't have before.  Has TMJ and  Lost weight sicne the sleep study.  DME is [facility] home [facility].   Review of Systems: Out of a complete 14 system review, the patient  complains of only the following symptoms, and all other reviewed systems are negative.  The patient's machine currently provides no RAMP, and since he has used a nasal pillow he has not had morning headaches.   Epworth  sleepiness score was 17 points and FSS was not evaluated.    Patient is married. Remarried July 2013.   Family History  Problem Relation Age of Onset  . Coronary artery disease Mother   . Diabetes Father   . Coronary artery disease Father   . Lung cancer Father     Past Medical History  Diagnosis Date  . Depressed   . Allergic rhinitis   . Nephrolithiasis   . Dyslipidemia   . Aortic stenosis   . Hypertension   . OSA (obstructive sleep apnea)   . Sinus complaint   . Obesity   . Polycythemia vera(238.4) 06/16/2012    Past Surgical History  Procedure Laterality Date  . Tonsillectomy  1982, 1993  . Aortic valve replacement  10/15/2008  . Hemorrhoid surgery  1990    Current Outpatient Prescriptions  Medication Sig Dispense Refill  . ALPRAZolam (XANAX) 0.5 MG tablet Take 1 mg by mouth as needed.      Marland Kitchen aspirin 81 MG EC tablet Take 1 tablet (81 mg total) by mouth daily. Swallow whole.  30 tablet  12  . hydrochlorothiazide (HYDRODIURIL) 12.5 MG tablet Take 12.5 mg by mouth daily.        Marland Kitchen ibuprofen (ADVIL,MOTRIN) 400 MG tablet Take 400 mg by mouth as needed.        Marland Kitchen levothyroxine (SYNTHROID) 25 MCG tablet Take 25 mcg by mouth daily.        Marland Kitchen lisinopril (PRINIVIL,ZESTRIL) 20 MG tablet Take 10 mg by mouth daily.       . Multiple Vitamin (MULTIVITAMIN) capsule Take 1 capsule by mouth daily.        . naproxen (NAPROSYN) 500 MG tablet Take 250 mg by mouth as needed.       Marland Kitchen NUVIGIL 250 MG tablet Take 125 mg by mouth daily. Now taking 1/2 tab.       No current facility-administered medications for this visit.    Allergies as of 02/12/2013  . (No Known Allergies)    Vitals: BP 119/78  Pulse 91  Temp(Src) 98.4 F (36.9 C) (Oral)  Ht 5\' 11"  (1.803 m)  Wt  244 lb (110.678 kg)  BMI 34.05 kg/m2 Last Weight:  Wt Readings from Last 1 Encounters:  02/12/13 244 lb (110.678 kg)   Last Height:   Ht Readings from Last 1 Encounters:  02/12/13 5\' 11"  (1.803 m)     Physical exam:  General: The patient is awake, alert and appears not in acute distress. The patient is well groomed. Head: Normocephalic, atraumatic. Neck is supple.  Short thick, Mallampati*4 , neck circumference: 17.5 inches. No  Retrognathia, status post tonsillectomy , nasal breathing unrestricted,  TMJ with clicking on the right jaw.  Cardiovascular:  Regular rate and rhythm without  murmurs or carotid bruit, and without distended neck veins. Respiratory: Lungs are clear to auscultation. Skin:  Without evidence of edema, or rash Trunk: BMI is elevated and patient  has normal posture.  Neurologic exam : The patient is awake and alert, oriented to place and time.  Memory subjective  described as intact. There is a normal attention span & concentration ability.  Speech is fluent without  dysarthria, dysphonia or aphasia.  Mood and affect are appropriate.  Cranial nerves: Pupils are equal and briskly reactive to light. Funduscopic exam without  evidence of pallor or edema. Extraocular movements  in vertical and horizontal planes intact and without nystagmus. Visual fields by finger perimetry are intact. Hearing to finger rub intact.  Facial sensation intact to fine touch. Facial motor strength is symmetric and tongue and uvula move midline.  Motor exam:   Normal tone and normal muscle bulk and symmetric normal strength in all extremities.  Sensory:  Fine touch, pinprick and vibration were tested in all extremities. Proprioception is  normal.  Coordination: Rapid alternating movements in the fingers/hands is tested and normal. Finger-to-nose maneuver  without evidence of ataxia, dysmetria or tremor.  Gait and station: Patient walks without assistive device and is able and assisted  stool climb up to the exam table. Strength within normal limits. Stance is stable and normal. Tandem gait is unfragmented. Romberg testing is normal.  Deep tendon reflexes: in the  upper and lower extremities are symmetric and intact.  Babinski maneuver response is  downgoing.   Assessment:  After physical and neurologic examination, review of laboratory studies, imaging, neurophysiology testing and pre-existing records, My  assessment is that of severe obstructive sleep apnea, but the patient responded to a lower CPAP pressure with that complete alleviation.  The nasal pillow mask but he was just issued,  associated with the onset of morning headaches.  He also would like a RAMP function to be set.  The patient has a history of neuropathy, which this spring in [place] was confirmed as having a diabetic component.  BMI addressed: He has lost weight on this diet, which will certainly benefit his apnea. He reports no strokes, TIAs palpitations in the meanwhile.    Plan:  Treatment plan and additional workup will be reviewed under Problem List.  I will change his mask to a AIRFIT P10 , and set a RAMP from 4 to 8 cm over 8 minutes. Order to [facility].  He will continue with his diet,   Dr Myna Hidalgo :Follows his polycythemia vera.

## 2013-02-12 NOTE — Patient Instructions (Addendum)
CPAP and BIPAP CPAP and BIPAP are methods of helping you breathe. CPAP stands for "continuous positive airway pressure." BIPAP stands for "bi-level positive airway pressure." Both CPAP and BIPAP are provided by a small machine with a flexible plastic tube that attaches to a plastic mask that goes over your nose or mouth. Air is blown into your air passages through your nose or mouth. This helps to keep your airways open and helps to keep you breathing well. The amount of pressure that is used to blow the air into your air passages can be set on the machine. The pressure setting is based on your needs. With CPAP, the amount of pressure stays the same while you breathe in and out. With BIPAP, the amount of pressure changes when you inhale and exhale. Your caregiver will recommend whether CPAP or BIPAP would be more helpful for you.  CPAP and BIPAP can be helpful for both adults and children with:  Sleep apnea.  Chronic Obstructive Pulmonary Disease (COPD), a condition like emphysema.  Diseases which weaken the muscles of the chest such as muscular dystrophy or neurological diseases.  Other problems that cause breathing to be weak or difficult. USE OF CPAP OR BIPAP The respiratory therapist or technician will help you get used to wearing the mask. Some people feel claustrophobic (a trapped or closed in feeling) at first, because the mask needs to be fairly snug on your face.   It may help you to get used to the mask gradually, by first holding the mask loosely over your nose or mouth using a low pressure setting on the machine. Gradually the mask can be applied more snugly with increased pressure. You can also gradually increase the amount of time the mask is used.  People with sleep apnea will use the mask and machine at night when they are sleeping. Others, like those with ALS or other breathing difficulties, may need the CPAP or BIPAP all the time.  If the first mask you try does not fit well, or  is uncomfortable, there are other types and sizes that can be tried.  If you tend to breathe through your mouth, a chin strap may be applied to help keep your mouth closed (if you are using a nasal mask).  The CPAP and BIPAP machines have alarms that may sound if the mask comes off or develops a leak.  You should not eat or drink while the CPAP or BIPAP is on. Food or fluids could get pushed into your lungs by the pressure of the CPAP or BIPAP. Sometimes CPAP or BIPAP machines are ordered for home use. If you are going to use the CPAP or BIPAP machine at home, follow these instructions  CPAP or BIPAP machines can be rented or purchased through home health care companies. There are many different brands of machines available. If you rent a machine before purchasing you may find which particular machine works well for you.  Ask questions if there is something you do not understand when picking out your machine.  Place your CPAP or BIPAP machine on a secure table or stand near an electrical outlet.  Know where the On/Off switch is.  Follow your doctor's instructions for how to set the pressure on your machine and when you should use it.  Do not smoke! Tobacco smoke residue can damage the machine. SEEK IMMEDIATE MEDICAL CARE IF:   You have redness or open areas around your nose or mouth.  You have trouble operating   the CPAP or BIPAP machine.  You cannot tolerate wearing the CPAP or BIPAP mask.  You have any questions or concerns. Document Released: 05/14/2004 Document Revised: 11/08/2011 Document Reviewed: 08/13/2008 ExitCare Patient Information 2014 [employer]. Sleep Apnea Sleep apnea is disorder that affects a person's sleep. A person with sleep apnea has abnormal pauses in their breathing when they sleep. It is hard for them to get a good sleep. This makes a person tired during the day. It also can lead to other physical problems. There are three types of sleep apnea. One type is  when breathing stops for a short time because your airway is blocked (obstructive sleep apnea). Another type is when the brain sometimes fails to give the normal signal to breathe to the muscles that control your breathing (central sleep apnea). The third type is a combination of the other two types. HOME CARE  Do not sleep on your back. Try to sleep on your side.  Take all medicine as told by your doctor.  Avoid alcohol, calming medicines (sedatives), and depressant drugs.  Try to lose weight if you are overweight. Talk to your doctor about a healthy weight goal. Your doctor may have you use a device that helps to open your airway. It can help you get the air that you need. It is called a positive airway pressure (PAP) device. There are three types of PAP devices:  Continuous positive airway pressure (CPAP) device.  Nasal expiratory positive airway pressure (EPAP) device.  Bilevel positive airway pressure (BPAP) device. MAKE SURE YOU:  Understand these instructions.  Will watch your condition.  Will get help right away if you are not doing well or get worse. Document Released: 05/25/2008 Document Revised: 08/02/2012 Document Reviewed: 12/18/2011 ExitCare Patient Information 2014 [employer].  

## 2013-02-12 NOTE — Assessment & Plan Note (Signed)
Reevaluation by a split study, 08-09-12, AHI 66.2 RDI 69.4 , o2 desat with nadir  to 81%, titrated to 8 cm water

## 2013-02-27 ENCOUNTER — Ambulatory Visit: Payer: 59 | Admitting: Hematology & Oncology

## 2013-02-27 ENCOUNTER — Other Ambulatory Visit: Payer: 59 | Admitting: Lab

## 2013-02-28 ENCOUNTER — Ambulatory Visit: Payer: 59

## 2013-02-28 ENCOUNTER — Ambulatory Visit (HOSPITAL_BASED_OUTPATIENT_CLINIC_OR_DEPARTMENT_OTHER): Payer: 59 | Admitting: Hematology & Oncology

## 2013-02-28 ENCOUNTER — Other Ambulatory Visit (HOSPITAL_BASED_OUTPATIENT_CLINIC_OR_DEPARTMENT_OTHER): Payer: 59 | Admitting: Lab

## 2013-02-28 VITALS — BP 118/77 | HR 76 | Temp 97.7°F | Resp 18 | Ht 71.0 in | Wt 242.0 lb

## 2013-02-28 DIAGNOSIS — D45 Polycythemia vera: Secondary | ICD-10-CM

## 2013-02-28 DIAGNOSIS — Z452 Encounter for adjustment and management of vascular access device: Secondary | ICD-10-CM

## 2013-02-28 LAB — CBC WITH DIFFERENTIAL (CANCER CENTER ONLY)
BASO%: 2.3 % — ABNORMAL HIGH (ref 0.0–2.0)
LYMPH%: 20.8 % (ref 14.0–48.0)
MCH: 22.8 pg — ABNORMAL LOW (ref 28.0–33.4)
MCV: 72 fL — ABNORMAL LOW (ref 82–98)
MONO%: 9.5 % (ref 0.0–13.0)
NEUT#: 4.3 10*3/uL (ref 1.5–6.5)
Platelets: 290 10*3/uL (ref 145–400)
RDW: 18.5 % — ABNORMAL HIGH (ref 11.1–15.7)
WBC: 7 10*3/uL (ref 4.0–10.0)

## 2013-02-28 NOTE — Progress Notes (Signed)
Chase Gutierrez presents today for phlebotomy per MD orders. Phlebotomy procedure started at 0830 and ended at 0840. 500 grams removed. Patient observed for 30 minutes after procedure without any incident. Patient tolerated procedure well. IV needle removed intact.

## 2013-02-28 NOTE — Progress Notes (Signed)
This office note has been dictated.

## 2013-03-01 NOTE — Progress Notes (Signed)
CC:   [name], M.D.  DIAGNOSIS:  Polycythemia vera, JAK2 negative.  CURRENT THERAPY:  Phlebotomy to maintain hematocrit less than 40%.  INTERIM HISTORY:  Mr. Kassis comes in for his followup.  He continues to feel better and better.  He is not working.  This has really been a godsend for him.  His wife ran down to [place] next week.  They will be down there for about 3 weeks.  He is losing weight.  He is watching his diet.  He feels better losing the weight.  I think he has lost almost 40 pounds since we last saw him. He still has sleep apnea.  This does not seem as much of a problem.  He is more active during the daytime.  His blood sugars are being monitored.  He is not sure when his next hemoglobin A1c will be checked.  He does have neuropathy.  This does seem to be a little bit better for him.  PHYSICAL EXAMINATION:  General:  This is a well-developed, well- nourished white gentleman in no obvious distress.  Vital Signs: Temperature of 97.7, pulse 76, respiratory rate 18, blood pressure 118/77.  Weight is 242.  Head and Neck:  Normocephalic, atraumatic skull.  There are no ocular or oral lesions.  There are no palpable cervical or supraclavicular lymph nodes.  Lungs:  Clear bilaterally. Cardiac:  Regular rate and rhythm with a normal S1, S2.  There are no murmurs, rubs, or bruits.  Abdomen:  Soft with good bowel sounds.  There is no palpable abdominal mass.  There is no fluid wave.  There is no palpable hepatosplenomegaly.  Extremities:  No clubbing, cyanosis, or edema.  Skin:  No rashes, ecchymosis, or petechia.  Back:  No tenderness of the spine, ribs, or hips.  LABORATORY STUDIES:  White blood cell count 7, hemoglobin 13, hematocrit 41.2, platelet count 230.  MCV is 72.  IMPRESSION AND PLAN:  Mr. Brander is a very nice 62 year old gentleman with polycythemia vera.  Again, he is JAK2 negative.  He has responded very nicely to phlebotomies.  I am glad  to see that he is feeling better and is more active.  His quality of life is better.  We will go ahead and phlebotomize him today.  Again, we want to keep his hematocrit below 40%.  This makes him feel more energetic.  Of note, he is taking aspirin.  We will go ahead and plan to get him back in another month or so.  He will come back to see [place] when he gets back from [place].    ______________________________ Josph Macho, M.D. PRE/MEDQ  D:  02/28/2013  T:  03/01/2013  Job:  [id number]

## 2013-03-16 ENCOUNTER — Telehealth: Payer: Self-pay | Admitting: Internal Medicine

## 2013-03-16 NOTE — Telephone Encounter (Signed)
Pt Called In this am Upset because His [employer] paperwork Has not Been faxed in, Spoke with  Juliette Alcide she called Dr.Ross and she Verified she had the paperwork and Would get it signed Tonight So I could fax in Monday. Called Pt back let him know of the Conversation W/ Dr.Ross he understood And Ok'd everything, I let him know Once I get paperwork I will have it faxed to Jessica/[employer] And send pt copy Via Mail. He ok'd 03/16/13/KM

## 2013-03-26 ENCOUNTER — Telehealth: Payer: Self-pay | Admitting: Internal Medicine

## 2013-03-26 NOTE — Telephone Encounter (Signed)
Routed to Dr. Ross 

## 2013-03-26 NOTE — Telephone Encounter (Signed)
New Prob     Wanted to speak to Dr. Tenny Craw concerning pts functional ability in regards to disability. Please call.

## 2013-03-27 NOTE — Telephone Encounter (Signed)
New Prob       Pt states he is experiencing some tingling in his fingers and he had an episode of feeling "foggy". Would like to speak to nurse regarding this.

## 2013-03-27 NOTE — Telephone Encounter (Signed)
Spoke with patient and he had an experience earlier today where he felt like he was in a "fog" and then some tingling in his fingers (bilaterally). Patient states that blood pressure is good and other than this has been feeling good. Advised he needed to contact Dr Clelia Croft (PCP) or go to ED if worse. Patient verbalized understanding. Did state he would go to [facility] before ED.

## 2013-03-28 ENCOUNTER — Encounter: Payer: Self-pay | Admitting: Internal Medicine

## 2013-03-28 NOTE — Telephone Encounter (Signed)
Called patient Playing bridge yesterday Had a ?anxiety type attack  Pain in upper R portion of chest  Not SOB Not pleuritc. Later hands started tingling and feet as well.  Both Felt in fog. Went home. Episode lasted about 3 min.   No dizziness.  Strange. Hands still tingling.    Due to see Dr Zoila Shutter today.  Seeing nutritionist and chiropracter in [place] Due to see chiropracter here next week  Agree with f/u with Shelda Altes today.  Episode does not sound cardiac in origin.  Do not think neuro.

## 2013-04-02 ENCOUNTER — Ambulatory Visit (HOSPITAL_BASED_OUTPATIENT_CLINIC_OR_DEPARTMENT_OTHER): Payer: 59 | Admitting: Hematology & Oncology

## 2013-04-02 ENCOUNTER — Other Ambulatory Visit (HOSPITAL_BASED_OUTPATIENT_CLINIC_OR_DEPARTMENT_OTHER): Payer: 59 | Admitting: Lab

## 2013-04-02 ENCOUNTER — Ambulatory Visit (HOSPITAL_BASED_OUTPATIENT_CLINIC_OR_DEPARTMENT_OTHER): Payer: 59

## 2013-04-02 VITALS — BP 117/79 | HR 61 | Resp 20

## 2013-04-02 VITALS — BP 106/74 | HR 64 | Temp 97.5°F | Resp 20 | Wt 239.0 lb

## 2013-04-02 DIAGNOSIS — D45 Polycythemia vera: Secondary | ICD-10-CM

## 2013-04-02 LAB — CMP (CANCER CENTER ONLY)
AST: 29 U/L (ref 11–38)
Albumin: 3.9 g/dL (ref 3.3–5.5)
BUN, Bld: 24 mg/dL — ABNORMAL HIGH (ref 7–22)
Calcium: 9.1 mg/dL (ref 8.0–10.3)
Chloride: 100 mEq/L (ref 98–108)
Creat: 1.5 mg/dl — ABNORMAL HIGH (ref 0.6–1.2)
Glucose, Bld: 121 mg/dL — ABNORMAL HIGH (ref 73–118)
Potassium: 4.4 mEq/L (ref 3.3–4.7)

## 2013-04-02 LAB — CBC WITH DIFFERENTIAL (CANCER CENTER ONLY)
BASO#: 0.1 10*3/uL (ref 0.0–0.2)
BASO%: 1.8 % (ref 0.0–2.0)
HCT: 40.4 % (ref 38.7–49.9)
HGB: 12.7 g/dL — ABNORMAL LOW (ref 13.0–17.1)
LYMPH#: 1.6 10*3/uL (ref 0.9–3.3)
LYMPH%: 20.7 % (ref 14.0–48.0)
MCHC: 31.4 g/dL — ABNORMAL LOW (ref 32.0–35.9)
MCV: 73 fL — ABNORMAL LOW (ref 82–98)
MONO#: 0.5 10*3/uL (ref 0.1–0.9)
NEUT%: 63.7 % (ref 40.0–80.0)
RDW: 17.7 % — ABNORMAL HIGH (ref 11.1–15.7)
WBC: 7.6 10*3/uL (ref 4.0–10.0)

## 2013-04-02 NOTE — Progress Notes (Signed)
Chase Gutierrez presents today for phlebotomy per MD orders. Phlebotomy procedure started at 0950 and ended at 1015. Approximately 500 mls removed. Patient observed for 30 minutes after procedure without any incident. Patient tolerated procedure well. IV needle removed intact.

## 2013-04-02 NOTE — Progress Notes (Signed)
This office note has been dictated.

## 2013-04-02 NOTE — Patient Instructions (Signed)
Therapeutic Phlebotomy Therapeutic phlebotomy is the controlled removal of blood from your body for the purpose of treating a medical condition. It is similar to donating blood. Usually, about a pint (470 mL) of blood is removed. The average adult has 9 to 12 pints (4.3 to 5.7 L) of blood. Therapeutic phlebotomy may be used to treat the following medical conditions:  Hemochromatosis. This is a condition in which there is too much iron in the blood.  Polycythemia vera. This is a condition in which there are too many red cells in the blood.  Porphyria cutanea tarda. This is a disease usually passed from one generation to the next (inherited). It is a condition in which an important part of hemoglobin is not made properly. This results in the build up of abnormal amounts of porphyrins in the body.  Sickle cell disease. This is an inherited disease. It is a condition in which the red blood cells form an abnormal crescent shape rather than a round shape. LET YOUR CAREGIVER KNOW ABOUT:  Allergies.  Medicines taken including herbs, eyedrops, over-the-counter medicines, and creams.  Use of steroids (by mouth or creams).  Previous problems with anesthetics or numbing medicine.  History of blood clots.  History of bleeding or blood problems.  Previous surgery.  Possibility of pregnancy, if this applies. RISKS AND COMPLICATIONS This is a simple and safe procedure. Problems are unlikely. However, problems can occur and may include:  Nausea or lightheadedness.  Low blood pressure.  Soreness, bleeding, swelling, or bruising at the needle insertion site.  Infection. BEFORE THE PROCEDURE  This is a procedure that can be done as an outpatient. Confirm the time that you need to arrive for your procedure. Confirm whether there is a need to fast or withhold any medications. It is helpful to wear clothing with sleeves that can be raised above the elbow. A blood sample may be done to determine the  amount of red blood cells or iron in your blood. Plan ahead of time to have someone drive you home after the procedure. PROCEDURE The entire procedure from preparation through recovery takes about 1 hour. The actual collection takes about 10 to 15 minutes.  A needle will be inserted into your vein.  Tubing and a collection bag will be attached to that needle.  Blood will flow through the needle and tubing into the collection bag.  You may be asked to open and close your hand slowly and continuously during the entire collection.  Once the specified amount of blood has been removed from your body, the collection bag and tubing will be clamped.  The needle will be removed.  Pressure will be held on the site of the needle insertion to stop the bleeding. Then a bandage will be placed over the needle insertion site. AFTER THE PROCEDURE  Your recovery will be assessed and monitored. If there are no problems, as an outpatient, you should be able to go home shortly after the procedure.  Document Released: 01/18/2011 Document Revised: 11/08/2011 Document Reviewed: 01/18/2011 ExitCare Patient Information 2014 [employer].  

## 2013-04-03 NOTE — Progress Notes (Signed)
CC:   Chase Gutierrez, M.D.  DIAGNOSIS:  Polycythemia vera, JAK2 negative.  CURRENT THERAPY:  Phlebotomy to maintain hematocrit below 40%.\  INTERIM HISTORY:  Chase Gutierrez comes in for his followup.  He is doing fairly well.  He got back from [place] last week.  I think he will be heading back down soon.  His daughter is going to go to college in [place], and he be taking her in a few weeks.  He said he had an "episode" last week.  He just did not feel right.  He had some tingling in his hands.  He felt like he was almost "panicking." It was possible that he may have had some hypoglycemia.  His blood __________ have been running low.  I told to him stop the hydrochlorothiazide.  He is not  edematous.  Since he lost weight, he has done really well with not retaining fluid.  He is also on lisinopril.  This probably is more so for his renal function.  He continues on aspirin 81 mg a day.  He has had no fever.  He has had no change in bowel or bladder habits. He had an upper and lower endoscopy done in [place].  He also had a capsule endoscopy.  Everything came back negative.  PHYSICAL EXAMINATION:  General:  This is a moderately obese white gentleman in no obvious distress.  Vital signs:  Temperature of 97.5, pulse 64, respiratory rate 20, blood pressure 106/74.  Weight is 239. Head and neck:  Normocephalic, atraumatic skull.  There are no ocular or oral lesions.  There are no palpable cervical or supraclavicular lymph nodes.  He has no facial plethora.  There is no conjunctival inflammation.  Lungs:  Clear bilaterally.  Cardiac:  Regular rate and rhythm with a normal S1 and S2.  He has a 2/6 systolic ejection murmur. Abdomen:  Soft.  He has good bowel sounds.  There is no palpable abdominal mass.  There is no palpable hepatosplenomegaly.  Extremities: Show no clubbing, cyanosis or edema.  Neurological:  Shows no focal neurological deficits.  LABORATORY STUDIES:  White cell  count is 7.6, hemoglobin 12.7, hematocrit 40.4, platelet count 231.  BUN is 24, creatinine 1.5. Glucose is 121.  IMPRESSION:  Chase Gutierrez is a nice 62 year old gentleman with polycythemia.  He has done nicely with this.  We have gotten his blood count down quite well.  He feels a whole lot better.  Of note, his last ferritin back in June was only 6, so we are doing well with him being iron deficient.  We will go ahead and plan to get him back in another month.  We will see how his blood count looks then.    ______________________________ Chase Gutierrez, M.D. PRE/MEDQ  D:  04/02/2013  T:  04/03/2013  Job:  [id number]

## 2013-04-18 ENCOUNTER — Telehealth: Payer: Self-pay | Admitting: Internal Medicine

## 2013-04-18 NOTE — Telephone Encounter (Signed)
[employer] checking on status of paperwork they faxed over for dr ross to sign, [phone] fax [phone]

## 2013-04-18 NOTE — Telephone Encounter (Signed)
Requested them be refaxed

## 2013-04-26 ENCOUNTER — Telehealth: Payer: Self-pay | Admitting: Internal Medicine

## 2013-04-26 NOTE — Telephone Encounter (Signed)
Spoke with Randa Evens at [employer] regarding patient's paperwork.  Dena from [facility] located paperwork and documentation of fax from 7/22.  I reported this to Randa Evens and she asked that paperwork be re-faxed to her personal fax at [phone].  Chaya Jan is faxing and will document.

## 2013-04-26 NOTE — Telephone Encounter (Signed)
Chase Gutierrez in Triage received a call from Declo w/ [facility]. She is requesting the patient's Attending Physician's Statement from July This was printed and faxed: [employer]-Joanne [phone] 12:11 PM/djc

## 2013-04-26 NOTE — Telephone Encounter (Signed)
F/up  Joanne// [employer] [phone] Following up on a letter that was faxed from Dr. Pascal Lux.

## 2013-05-04 ENCOUNTER — Ambulatory Visit (HOSPITAL_BASED_OUTPATIENT_CLINIC_OR_DEPARTMENT_OTHER): Payer: 59 | Admitting: Hematology & Oncology

## 2013-05-04 ENCOUNTER — Ambulatory Visit (HOSPITAL_BASED_OUTPATIENT_CLINIC_OR_DEPARTMENT_OTHER): Payer: 59

## 2013-05-04 ENCOUNTER — Other Ambulatory Visit (HOSPITAL_BASED_OUTPATIENT_CLINIC_OR_DEPARTMENT_OTHER): Payer: 59 | Admitting: Lab

## 2013-05-04 VITALS — BP 112/76 | HR 67 | Resp 20

## 2013-05-04 VITALS — BP 113/79 | HR 71 | Temp 97.6°F | Resp 20 | Wt 230.0 lb

## 2013-05-04 DIAGNOSIS — D45 Polycythemia vera: Secondary | ICD-10-CM

## 2013-05-04 DIAGNOSIS — R5381 Other malaise: Secondary | ICD-10-CM

## 2013-05-04 LAB — CBC WITH DIFFERENTIAL (CANCER CENTER ONLY)
BASO#: 0.2 10*3/uL (ref 0.0–0.2)
EOS%: 6.7 % (ref 0.0–7.0)
Eosinophils Absolute: 0.5 10*3/uL (ref 0.0–0.5)
HCT: 40.6 % (ref 38.7–49.9)
HGB: 12.8 g/dL — ABNORMAL LOW (ref 13.0–17.1)
LYMPH#: 1.4 10*3/uL (ref 0.9–3.3)
MONO#: 0.6 10*3/uL (ref 0.1–0.9)
NEUT#: 5.1 10*3/uL (ref 1.5–6.5)
NEUT%: 65.4 % (ref 40.0–80.0)
RBC: 5.62 10*6/uL (ref 4.20–5.70)
WBC: 7.8 10*3/uL (ref 4.0–10.0)

## 2013-05-04 LAB — IRON AND TIBC CHCC
%SAT: 9 % — ABNORMAL LOW (ref 20–55)
TIBC: 440 ug/dL — ABNORMAL HIGH (ref 202–409)
UIBC: 400 ug/dL — ABNORMAL HIGH (ref 117–376)

## 2013-05-04 LAB — FERRITIN CHCC: Ferritin: 10 ng/ml — ABNORMAL LOW (ref 22–316)

## 2013-05-04 NOTE — Progress Notes (Signed)
Chase Gutierrez presents today for phlebotomy per MD orders. Phlebotomy procedure started at 0930 and ended at 0940. 500 mls removed. Patient observed for 30 minutes after procedure without any incident. Patient tolerated procedure well. IV needle removed intact.

## 2013-05-04 NOTE — Progress Notes (Signed)
This office note has been dictated.

## 2013-05-04 NOTE — Progress Notes (Signed)
As patient was walking out, became weak, assisted back to chair. Chase Gutierrez   After sitting in chair upright, patient felt fine, ambulates without difficulty.

## 2013-05-05 NOTE — Progress Notes (Signed)
CC:   [name], M.D.  DIAGNOSIS:  Polycythemia vera, JAK2 negative.  CURRENT THERAPY: 1. Phlebotomy to maintain hematocrit below 40%. 2. Aspirin 81 mg p.o. daily.  INTERIM HISTORY:  Chase Gutierrez comes in for his followup.  He does feel a little bit sluggish.  This typically happens to him once his hematocrit gets above 40.  His daughter is now in college.  He is happy that she is there and she is doing well.  He is still trying to lose a little weight.  He is down 9 pounds since we last saw him.  He is trying to exercise more.  He does still get fatigued.  He does have sleep apnea.  This has been a challenge for him.  He uses a CPAP.  He has had no problems with fever, sweats, or chills.  PHYSICAL EXAMINATION:  General:  This is a fairly well-developed, well- nourished white gentleman in no obvious distress.  Vital Signs: Temperature of 97.6, pulse 71, respiratory rate 20, blood pressure 113/79, weight is 230 pounds.  Head and Neck:  Normocephalic, atraumatic skull.  There are no ocular or oral lesions.  There are no palpable cervical or supraclavicular lymph nodes.  Lungs:  Clear bilaterally.  He has no rales, wheezes, or rhonchi.  Cardiac:  Regular rate and rhythm with a normal S1 and S2.  He has a 2/6 systolic ejection murmur that is chronic.  Abdomen:  Soft.  He has good bowel sounds.  There is no fluid wave.  There is no palpable hepatosplenomegaly.  Extremities:  No clubbing, cyanosis or edema.  Neurological:  No focal neurological deficits.  Skin:  No rashes, ecchymosis, or petechia.  LABORATORY STUDIES:  White cell count is 7.6, hemoglobin 12.8, hematocrit 40.6, platelet count 243.  IMPRESSION:  [name] is a very nice 62 year old gentleman with polycythemia.  He is JAK2 negative.  He has done very well with phlebotomies.  Again, we need to keep his hematocrit below 40%, as this really makes him more functional.  He still does get fatigued quite  easily.  I still think that it would be very difficult, if not impossible, for him to work, as he has very little stamina.  Unfortunately, [name] just has very "thick blood" and this really compromises his circulation and getting oxygen and nutrients to vital organs and brain.  I will plan to see him back in another 4 or 5 weeks.  I want to see him back before goes down to [place].  This will be in mid October.    ______________________________ Chase Gutierrez, M.D. PRE/MEDQ  D:  05/04/2013  T:  05/05/2013  Job:  [id number]

## 2013-05-09 ENCOUNTER — Telehealth: Payer: Self-pay | Admitting: Internal Medicine

## 2013-05-09 NOTE — Telephone Encounter (Signed)
Provided information on call to Medical Records. Medical Records has faxed this several times to Joann at [facility]. Medical Records will followup with Chyrl Civatte today to discuss status of request.

## 2013-05-09 NOTE — Telephone Encounter (Signed)
Ruben Gottron from [employer] is requesting status of a letter summarizing a conversation between Dr. Tenny Craw and Dr. Raynald Kemp from [employer]. They are requesting for Dr. Tenny Craw to review the summary and amend any contents deemed necessary. If unable to locate the original letter dated 03/30/2013 Dena in Medical Records has a copy. The letter will need to be returned to Dr. Carola Rhine attention at [phone]. Thanks, Almira Coaster

## 2013-05-09 NOTE — Telephone Encounter (Signed)
New Problem  Called to check on fax about Pt// letter summarizing a phone conversation with Dr. Cain/// the letter needs to be review signed and faxed back// is faxing over a duplicate.

## 2013-06-11 ENCOUNTER — Ambulatory Visit (HOSPITAL_BASED_OUTPATIENT_CLINIC_OR_DEPARTMENT_OTHER): Payer: 59 | Admitting: Hematology & Oncology

## 2013-06-11 ENCOUNTER — Other Ambulatory Visit (HOSPITAL_BASED_OUTPATIENT_CLINIC_OR_DEPARTMENT_OTHER): Payer: 59 | Admitting: Lab

## 2013-06-11 ENCOUNTER — Ambulatory Visit (HOSPITAL_BASED_OUTPATIENT_CLINIC_OR_DEPARTMENT_OTHER): Payer: 59

## 2013-06-11 VITALS — BP 121/81 | HR 84 | Temp 97.0°F | Resp 18 | Ht 71.0 in | Wt 230.0 lb

## 2013-06-11 VITALS — BP 112/79 | HR 84 | Resp 18

## 2013-06-11 DIAGNOSIS — D45 Polycythemia vera: Secondary | ICD-10-CM

## 2013-06-11 DIAGNOSIS — R739 Hyperglycemia, unspecified: Secondary | ICD-10-CM

## 2013-06-11 DIAGNOSIS — D509 Iron deficiency anemia, unspecified: Secondary | ICD-10-CM

## 2013-06-11 LAB — HEMOGLOBIN A1C
Hgb A1c MFr Bld: 6 % — ABNORMAL HIGH (ref <5.7)
Mean Plasma Glucose: 126 mg/dL — ABNORMAL HIGH (ref <117)

## 2013-06-11 LAB — CBC WITH DIFFERENTIAL (CANCER CENTER ONLY)
BASO#: 0.2 10*3/uL (ref 0.0–0.2)
BASO%: 2.2 % — ABNORMAL HIGH (ref 0.0–2.0)
EOS%: 5 % (ref 0.0–7.0)
HCT: 43 % (ref 38.7–49.9)
HGB: 13.5 g/dL (ref 13.0–17.1)
LYMPH#: 1.5 10*3/uL (ref 0.9–3.3)
LYMPH%: 17.9 % (ref 14.0–48.0)
MCHC: 31.4 g/dL — ABNORMAL LOW (ref 32.0–35.9)
MCV: 71 fL — ABNORMAL LOW (ref 82–98)
NEUT%: 68.4 % (ref 40.0–80.0)
RDW: 18.4 % — ABNORMAL HIGH (ref 11.1–15.7)

## 2013-06-11 NOTE — Progress Notes (Signed)
This office note has been dictated.

## 2013-06-12 NOTE — Progress Notes (Signed)
CC:   [name], M.D.  DIAGNOSIS:  Polycythemia vera, JAK2 negative.  CURRENT THERAPY: 1. Phlebotomy to maintain hematocrit below 40%. 2. Aspirin 81 mg p.o. q. day.  INTERIM HISTORY:  Mr. Greenhalgh comes in for followup.  He is doing okay. He had some CAT scans done over the weekend.  He had some hematuria.  He had a CAT scan which showed a 1.4 x 1.1 hyperdense lesion with peripheral calcifications in the lower pole of the right kidney.  I think he had this before.  He had to be followed for this down in [place].  There was no hydronephrosis.  No mention of any kind of kidney stones.  A CTA of the chest also was done.  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  The largest measured 5 mm.  He does have a little bit of an ascending aortic aneurysm, measuring 4.6 cm.  He has had no chest wall pain.  He has had no change in bowel or bladder habits.  He has had no abdominal pain.  He has had no leg swelling.  He is iron deficient.  His last ferritin was 10 with an iron saturation of 9% back in September.  PHYSICAL EXAMINATION:  General:  This is a well-developed, well- nourished white gentleman who is mildly obese.  Vital Signs:  Show a temperature of 97.9, pulse 84, respiratory rate 18, blood pressure 121/81.  Weight is 230 pounds.  Head and Neck:  Shows a normocephalic, atraumatic skull.  There are no ocular or oral lesions.  He has no palpable cervical or supraclavicular lymph nodes.  Lungs:  Clear bilaterally.  Cardiac:  Regular rate and rhythm with a normal S1 and S2. There are no murmurs, rubs or bruits.  Abdomen:  Soft.  He has good bowel sounds.  There is no fluid wave.  There is no palpable abdominal mass.  There is no palpable hepatosplenomegaly.  Back:  No tenderness over the spine, ribs, or hips.  Extremities:  Show no clubbing, cyanosis or edema.  He has good range motion of his joints.  He has good strength in the upper and lower  extremities.  Skin:  Shows some slight plethora with his face.  He has a little bit of a ruddy complexion on his skin. Neurological:  No focal neurological deficits.  LABORATORY STUDIES:  White cell count is 8.6, hemoglobin 13.5, hematocrit 43, platelet count 287.  IMPRESSION:  Mr. Dalby is a 62 year old gentleman with polycythemia. He is JAK2 negative.  However, his bone marrow still shows the ability to make red cells despite being iron deficient.  We will go ahead and phlebotomize him today.  He is going to [place] for his annual fall trip.  He will leave in 2 weeks.  We will phlebotomize him 1 more time before he goes to [place].  He will not be back until December, so I want to make sure that his blood counts are doing okay while he is down there.  I will see him back in December once he gets back from [place].    ______________________________ Josph Macho, M.D. PRE/MEDQ  D:  06/11/2013  T:  06/12/2013  Job:  [id number]

## 2013-06-21 ENCOUNTER — Ambulatory Visit: Payer: 59

## 2013-06-21 VITALS — BP 142/84 | HR 70 | Temp 97.3°F | Resp 18

## 2013-06-21 DIAGNOSIS — D45 Polycythemia vera: Secondary | ICD-10-CM

## 2013-06-21 NOTE — Progress Notes (Unsigned)
Chase Gutierrez presents today for phlebotomy per MD orders. Phlebotomy procedure started at 1015 and ended at 1025. 500 grams removed. Patient observed for 30 minutes after procedure without any incident. Patient tolerated procedure well. IV needle removed intact.

## 2013-06-21 NOTE — Patient Instructions (Signed)

## 2013-06-29 ENCOUNTER — Telehealth: Payer: Self-pay | Admitting: *Deleted

## 2013-06-29 NOTE — Telephone Encounter (Signed)
Per dr Tenny Craw, CT shows aorta is stable from scan of 2011  Need lipid panel from [facility] with pt, aware of results.   Spoke with dr Alver Fisher office, they are going to fax the labs.

## 2013-08-06 ENCOUNTER — Ambulatory Visit: Payer: 59 | Admitting: Hematology & Oncology

## 2013-08-06 ENCOUNTER — Other Ambulatory Visit: Payer: 59 | Admitting: Lab

## 2013-08-08 ENCOUNTER — Ambulatory Visit (HOSPITAL_BASED_OUTPATIENT_CLINIC_OR_DEPARTMENT_OTHER): Payer: 59 | Admitting: Hematology & Oncology

## 2013-08-08 ENCOUNTER — Encounter: Payer: Self-pay | Admitting: *Deleted

## 2013-08-08 ENCOUNTER — Other Ambulatory Visit (HOSPITAL_BASED_OUTPATIENT_CLINIC_OR_DEPARTMENT_OTHER): Payer: 59 | Admitting: Lab

## 2013-08-08 VITALS — BP 149/81 | HR 74 | Temp 98.1°F | Resp 18 | Ht 70.0 in | Wt 243.0 lb

## 2013-08-08 DIAGNOSIS — F064 Anxiety disorder due to known physiological condition: Secondary | ICD-10-CM

## 2013-08-08 DIAGNOSIS — Z87891 Personal history of nicotine dependence: Secondary | ICD-10-CM

## 2013-08-08 DIAGNOSIS — F411 Generalized anxiety disorder: Secondary | ICD-10-CM

## 2013-08-08 DIAGNOSIS — D45 Polycythemia vera: Secondary | ICD-10-CM

## 2013-08-08 LAB — CBC WITH DIFFERENTIAL (CANCER CENTER ONLY)
EOS%: 2.6 % (ref 0.0–7.0)
HCT: 35.4 % — ABNORMAL LOW (ref 38.7–49.9)
MCH: 21.4 pg — ABNORMAL LOW (ref 28.0–33.4)
MCHC: 30.5 g/dL — ABNORMAL LOW (ref 32.0–35.9)
MONO%: 7.4 % (ref 0.0–13.0)
NEUT#: 5.6 10*3/uL (ref 1.5–6.5)
Platelets: 231 10*3/uL (ref 145–400)
RBC: 5.05 10*6/uL (ref 4.20–5.70)

## 2013-08-08 LAB — FERRITIN CHCC: Ferritin: 7 ng/ml — ABNORMAL LOW (ref 22–316)

## 2013-08-08 LAB — CHCC SATELLITE - SMEAR

## 2013-08-08 MED ORDER — ALPRAZOLAM 0.5 MG PO TABS: 1.0000 mg | ORAL_TABLET | Freq: Two times a day (BID) | ORAL | Status: DC | PRN

## 2013-08-08 NOTE — Progress Notes (Signed)
This office note has been dictated.

## 2013-08-08 NOTE — Progress Notes (Signed)
Pt's Hct was 35.4 today.  No Phlebotomy needed today.

## 2013-08-09 NOTE — Progress Notes (Signed)
CC:   Chase Riffle, MD, [facility] Chase Gutierrez, M.D.  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  CURRENT THERAPY: 1. Phlebotomy to maintain hematocrit below 40%. 2. Aspirin 81 mg p.o. daily.  INTERIM HISTORY:  Chase Gutierrez comes in for followup.  He is doing fairly well.  There are some issues with his heart.  He may have some palpitations.  He sees Dr. Dietrich Pates.  He will be seeing her soon.  He has seen his kidney doctor.  He has been down to [place].  He has gained some weight.  He is a little worried about this.  He is still under a lot of stress.  There is some tension with his daughter and his wife.  His daughter comes home from school today.  He is a little worried about what might happen.  He does have sleep apnea.  I think he uses his CPAP machine.  We clearly are depleting him of iron.  When we last saw him in October, his ferritin was only 8.  He has had no bleeding.  He has had no fever.  He is not sweating as much.  He has had some occasional leg swelling.  PHYSICAL EXAMINATION:  General:  This is a mildly obese, white gentleman, in no obvious distress.  Vital Signs:  Temperature of 98.1, pulse 74, respiratory rate 18, blood pressure 149/81.  Weight is 243 pounds.  Head and Neck:  Normocephalic, atraumatic skull.  There are no ocular or oral lesions.  There are no palpable cervical or supraclavicular lymph nodes.  Lungs:  Clear bilaterally.  Cardiac: Regular rate and rhythm with a normal S1, S2.  There are no murmurs, rubs, or bruits.  Abdomen:  Soft, mildly obese.  He has good bowel sounds.  There is no fluid wave.  There is no palpable abdominal mass. There is no palpable hepatosplenomegaly.  Back:  No tenderness over the spine, ribs, or hips.  Extremities:  No edema in his legs.  He has a decent range motion of his joints.  He has good muscle strength.  Skin: No rashes, ecchymosis, or petechia.  He may have a couple of actinic keratoses.  Neurological:   No focal neurological deficits.  LABORATORY STUDIES:  White cell count 8, hemoglobin 10.8, hematocrit 35.4, platelet count 231.  MCV is 70.  IMPRESSION:  Chase Gutierrez is a 62 year old gentleman with polycythemia vera.  I also need to mention that he is not smoking any longer.  I think this also has helped with respect to his hemoglobin coming down nicely.  I did look at his blood smear.  I did not see anything that looked suspicious for any bone marrow transformation.  He did have microcytic red cells.  There were no nucleated red blood cells.  There were no immature myeloid cells.  We do not need to phlebotomize him today.  I did go ahead and refill his Xanax.  This really does help him with anxiety.  I will plan to get him back in early January.  We will see what his blood counts are.  He typically goes down to [place] in January, and I want to make sure that he does not need to be phlebotomized.    ______________________________ Chase Gutierrez, M.D. PRE/MEDQ  D:  08/08/2013  T:  08/09/2013  Job:  [id number]

## 2013-08-29 ENCOUNTER — Other Ambulatory Visit: Payer: Self-pay | Admitting: *Deleted

## 2013-08-29 DIAGNOSIS — D45 Polycythemia vera: Secondary | ICD-10-CM

## 2013-08-31 ENCOUNTER — Ambulatory Visit (HOSPITAL_BASED_OUTPATIENT_CLINIC_OR_DEPARTMENT_OTHER): Payer: 59 | Admitting: Hematology & Oncology

## 2013-08-31 ENCOUNTER — Ambulatory Visit (HOSPITAL_BASED_OUTPATIENT_CLINIC_OR_DEPARTMENT_OTHER): Payer: 59

## 2013-08-31 ENCOUNTER — Other Ambulatory Visit (HOSPITAL_BASED_OUTPATIENT_CLINIC_OR_DEPARTMENT_OTHER): Payer: 59 | Admitting: Lab

## 2013-08-31 VITALS — BP 114/70 | HR 68 | Resp 18

## 2013-08-31 VITALS — BP 120/83 | HR 71 | Temp 97.9°F | Resp 18 | Ht 70.0 in | Wt 242.0 lb

## 2013-08-31 DIAGNOSIS — D45 Polycythemia vera: Secondary | ICD-10-CM

## 2013-08-31 LAB — CBC WITH DIFFERENTIAL (CANCER CENTER ONLY)
BASO#: 0.2 10*3/uL (ref 0.0–0.2)
BASO%: 2.9 % — ABNORMAL HIGH (ref 0.0–2.0)
EOS ABS: 0.4 10*3/uL (ref 0.0–0.5)
EOS%: 5.2 % (ref 0.0–7.0)
HCT: 40.5 % (ref 38.7–49.9)
HGB: 12.1 g/dL — ABNORMAL LOW (ref 13.0–17.1)
LYMPH#: 1.9 10*3/uL (ref 0.9–3.3)
LYMPH%: 26.3 % (ref 14.0–48.0)
MCH: 21 pg — ABNORMAL LOW (ref 28.0–33.4)
MCHC: 29.9 g/dL — ABNORMAL LOW (ref 32.0–35.9)
MCV: 70 fL — AB (ref 82–98)
MONO#: 0.8 10*3/uL (ref 0.1–0.9)
MONO%: 10.6 % (ref 0.0–13.0)
NEUT%: 55 % (ref 40.0–80.0)
NEUTROS ABS: 4.1 10*3/uL (ref 1.5–6.5)
PLATELETS: 235 10*3/uL (ref 145–400)
RBC: 5.77 10*6/uL — AB (ref 4.20–5.70)
RDW: 18.8 % — ABNORMAL HIGH (ref 11.1–15.7)
WBC: 7.4 10*3/uL (ref 4.0–10.0)

## 2013-08-31 LAB — FERRITIN CHCC: FERRITIN: 7 ng/mL — AB (ref 22–316)

## 2013-08-31 NOTE — Progress Notes (Signed)
Chase Gutierrez presents today for phlebotomy per MD orders. Phlebotomy procedure started at 1325 and ended at 1350. 400 grams removed. Additional blood was not removed due to the phlebotomy line clotting off. He was at the the borderline line limit of phlebotomy to begin with (hgb 40.5 vs hgb <40). Patient observed for 30 minutes after procedure without any incident. Patient tolerated procedure well. IV needle removed intact.

## 2013-08-31 NOTE — Patient Instructions (Signed)

## 2013-08-31 NOTE — Progress Notes (Signed)
This office note has been dictated.

## 2013-09-03 NOTE — Progress Notes (Signed)
CC:   Janalyn Rouse, M.D. Fay Records, MD, [facility]  DIAGNOSIS:  Polycythemia vera - JAK2 negative.  CURRENT THERAPY: 1. Phlebotomy to maintain hematocrit below 40%. 2. Aspirin 81 mg p.o. daily.  INTERIM HISTORY:  Mr. Craine comes in for his followup.  He is going down to [place] next week.  He will probably __________ for about a month or so.  He and his wife __________ for the winter.  When he comes back, will depend on what the __________.  Overall, he has been doing pretty well.  The phlebotomy program that we have him on really has helped him out.  He is not as fatigued.  He is not sweating as much.  He is much more mentally alert.  He has had no change in bowel or bladder habits.  He has had no rashes. His blood sugars have been under decent control from what he says.  He does have hypothyroidism.  He has had no gouty attacks.  We want to make him iron deficient.  When we last checked his iron studies back in December, his ferritin was 7.  PHYSICAL EXAMINATION:  General:  This is a well-developed, well- nourished, white gentleman in no obvious distress.  Vital Signs: Temperature of 97.9, pulse 71, respiratory rate 18, blood pressure 120/83, weight is 242 pounds.  Head and Neck:  Normocephalic, atraumatic skull.  There are no ocular or oral lesions.  There are no palpable cervical or supraclavicular lymph nodes.  Lungs:  Clear bilaterally. Cardiac:  Regular rate and rhythm with a normal S1 and S2.  There are no murmurs, rubs, or bruits.  Abdomen:  Soft.  He has good bowel sounds. He is somewhat obese.  He has no hepatosplenomegaly.  Extremities:  No clubbing, cyanosis, or edema.  Neurologic:  No focal neurological deficits.  LABORATORY STUDIES:  White cell count 7.4, hemoglobin 12.1, hematocrit 40.5, platelet count 235.  IMPRESSION:  Mr. Kuenzel is a 63 year old gentleman with polycythemia. We will go ahead and phlebotomize him.  Again, he will be gone for  at least a month or so.  He is just above 40%, so we will go ahead and phlebotomize him.  This hopefully will hold him on until he gets back from [place].  I did give him a prescription to take to lab down to [place] to have blood work done if feels like he needs to.  I will plan to see him back probably in 6-8 weeks, or whenever he returns.    ______________________________ Volanda Napoleon, M.D. PRE/MEDQ  D:  08/31/2013  T:  09/01/2013  Job:  [id number]

## 2013-09-25 ENCOUNTER — Telehealth: Payer: Self-pay | Admitting: Hematology & Oncology

## 2013-09-25 NOTE — Telephone Encounter (Signed)
Faxed Medical Records via fax today  to:  [facility] Ph: [phone] Fx: [phone]   Medical  Records requested from 2013 to present   [place] SCANNED

## 2013-11-02 ENCOUNTER — Other Ambulatory Visit (HOSPITAL_BASED_OUTPATIENT_CLINIC_OR_DEPARTMENT_OTHER): Payer: 59 | Admitting: Lab

## 2013-11-02 ENCOUNTER — Ambulatory Visit (HOSPITAL_BASED_OUTPATIENT_CLINIC_OR_DEPARTMENT_OTHER): Payer: 59

## 2013-11-02 ENCOUNTER — Encounter: Payer: Self-pay | Admitting: Hematology & Oncology

## 2013-11-02 ENCOUNTER — Ambulatory Visit (HOSPITAL_BASED_OUTPATIENT_CLINIC_OR_DEPARTMENT_OTHER): Payer: 59 | Admitting: Hematology & Oncology

## 2013-11-02 VITALS — BP 155/90 | HR 89 | Temp 97.5°F | Resp 18 | Ht 68.0 in | Wt 242.0 lb

## 2013-11-02 VITALS — BP 137/93 | HR 81 | Resp 16

## 2013-11-02 DIAGNOSIS — R5381 Other malaise: Secondary | ICD-10-CM

## 2013-11-02 DIAGNOSIS — D509 Iron deficiency anemia, unspecified: Secondary | ICD-10-CM

## 2013-11-02 DIAGNOSIS — D45 Polycythemia vera: Secondary | ICD-10-CM

## 2013-11-02 DIAGNOSIS — R5383 Other fatigue: Secondary | ICD-10-CM

## 2013-11-02 LAB — CBC WITH DIFFERENTIAL (CANCER CENTER ONLY)
BASO#: 0.2 10*3/uL (ref 0.0–0.2)
BASO%: 2.1 % — ABNORMAL HIGH (ref 0.0–2.0)
EOS%: 6.1 % (ref 0.0–7.0)
Eosinophils Absolute: 0.5 10*3/uL (ref 0.0–0.5)
HEMATOCRIT: 43.1 % (ref 38.7–49.9)
HEMOGLOBIN: 13.4 g/dL (ref 13.0–17.1)
LYMPH#: 2.2 10*3/uL (ref 0.9–3.3)
LYMPH%: 24.3 % (ref 14.0–48.0)
MCH: 21.5 pg — ABNORMAL LOW (ref 28.0–33.4)
MCHC: 31.1 g/dL — ABNORMAL LOW (ref 32.0–35.9)
MCV: 69 fL — AB (ref 82–98)
MONO#: 0.7 10*3/uL (ref 0.1–0.9)
MONO%: 8.3 % (ref 0.0–13.0)
NEUT#: 5.3 10*3/uL (ref 1.5–6.5)
NEUT%: 59.2 % (ref 40.0–80.0)
Platelets: 222 10*3/uL (ref 145–400)
RBC: 6.24 10*6/uL — ABNORMAL HIGH (ref 4.20–5.70)
RDW: 21.1 % — ABNORMAL HIGH (ref 11.1–15.7)
WBC: 8.9 10*3/uL (ref 4.0–10.0)

## 2013-11-02 LAB — FERRITIN CHCC: Ferritin: 9 ng/ml — ABNORMAL LOW (ref 22–316)

## 2013-11-02 NOTE — Patient Instructions (Signed)
You Can Quit Smoking If you are ready to quit smoking or are thinking about it, congratulations! You have chosen to help yourself be healthier and live longer! There are lots of different ways to quit smoking. Nicotine gum, nicotine patches, a nicotine inhaler, or nicotine nasal spray can help with physical craving. Hypnosis, support groups, and medicines help break the habit of smoking. TIPS TO GET OFF AND STAY OFF CIGARETTES  Learn to predict your moods. Do not let a bad situation be your excuse to have a cigarette. Some situations in your life might tempt you to have a cigarette.  Ask friends and co-workers not to smoke around you.  Make your home smoke-free.  Never have "just one" cigarette. It leads to wanting another and another. Remind yourself of your decision to quit.  On a card, make a list of your reasons for not smoking. Read it at least the same number of times a day as you have a cigarette. Tell yourself everyday, "I do not want to smoke. I choose not to smoke."  Ask someone at home or work to help you with your plan to quit smoking.  Have something planned after you eat or have a cup of coffee. Take a walk or get other exercise to perk you up. This will help to keep you from overeating.  Try a relaxation exercise to calm you down and decrease your stress. Remember, you may be tense and nervous the first two weeks after you quit. This will pass.  Find new activities to keep your hands busy. Play with a pen, coin, or rubber band. Doodle or draw things on paper.  Brush your teeth right after eating. This will help cut down the craving for the taste of tobacco after meals. You can try mouthwash too.  Try gum, breath mints, or diet candy to keep something in your mouth. IF YOU SMOKE AND WANT TO QUIT:  Do not stock up on cigarettes. Never buy a carton. Wait until one pack is finished before you buy another.  Never carry cigarettes with you at work or at home.  Keep cigarettes  as far away from you as possible. Leave them with someone else.  Never carry matches or a lighter with you.  Ask yourself, "Do I need this cigarette or is this just a reflex?"  Bet with someone that you can quit. Put cigarette money in a piggy bank every morning. If you smoke, you give up the money. If you do not smoke, by the end of the week, you keep the money.  Keep trying. It takes 21 days to change a habit!  Talk to your doctor about using medicines to help you quit. These include nicotine replacement gum, lozenges, or skin patches. Document Released: 06/12/2009 Document Revised: 11/08/2011 Document Reviewed: 06/12/2009 [employer] Patient Information 2014 [employer].  

## 2013-11-02 NOTE — Patient Instructions (Signed)
Therapeutic Phlebotomy Therapeutic phlebotomy is the controlled removal of blood from your body for the purpose of treating a medical condition. It is similar to donating blood. Usually, about a pint (470 mL) of blood is removed. The average adult has 9 to 12 pints (4.3 to 5.7 L) of blood. Therapeutic phlebotomy may be used to treat the following medical conditions:  Hemochromatosis. This is a condition in which there is too much iron in the blood.  Polycythemia vera. This is a condition in which there are too many red cells in the blood.  Porphyria cutanea tarda. This is a disease usually passed from one generation to the next (inherited). It is a condition in which an important part of hemoglobin is not made properly. This results in the build up of abnormal amounts of porphyrins in the body.  Sickle cell disease. This is an inherited disease. It is a condition in which the red blood cells form an abnormal crescent shape rather than a round shape. LET YOUR CAREGIVER KNOW ABOUT:  Allergies.  Medicines taken including herbs, eyedrops, over-the-counter medicines, and creams.  Use of steroids (by mouth or creams).  Previous problems with anesthetics or numbing medicine.  History of blood clots.  History of bleeding or blood problems.  Previous surgery.  Possibility of pregnancy, if this applies. RISKS AND COMPLICATIONS This is a simple and safe procedure. Problems are unlikely. However, problems can occur and may include:  Nausea or lightheadedness.  Low blood pressure.  Soreness, bleeding, swelling, or bruising at the needle insertion site.  Infection. BEFORE THE PROCEDURE  This is a procedure that can be done as an outpatient. Confirm the time that you need to arrive for your procedure. Confirm whether there is a need to fast or withhold any medications. It is helpful to wear clothing with sleeves that can be raised above the elbow. A blood sample may be done to determine the  amount of red blood cells or iron in your blood. Plan ahead of time to have someone drive you home after the procedure. PROCEDURE The entire procedure from preparation through recovery takes about 1 hour. The actual collection takes about 10 to 15 minutes.  A needle will be inserted into your vein.  Tubing and a collection bag will be attached to that needle.  Blood will flow through the needle and tubing into the collection bag.  You may be asked to open and close your hand slowly and continuously during the entire collection.  Once the specified amount of blood has been removed from your body, the collection bag and tubing will be clamped.  The needle will be removed.  Pressure will be held on the site of the needle insertion to stop the bleeding. Then a bandage will be placed over the needle insertion site. AFTER THE PROCEDURE  Your recovery will be assessed and monitored. If there are no problems, as an outpatient, you should be able to go home shortly after the procedure.  Document Released: 01/18/2011 Document Revised: 11/08/2011 Document Reviewed: 01/18/2011 ExitCare Patient Information 2014 [employer].  

## 2013-11-02 NOTE — Progress Notes (Signed)
Chase Gutierrez presents today for phlebotomy per MD orders. Phlebotomy procedure started at 1310 and ended at 1325. 500 mls  removed. Patient observed for 30 minutes after procedure without any incident. Patient tolerated procedure well. IV needle removed intact.

## 2013-11-02 NOTE — Progress Notes (Signed)
  DIAGNOSIS:  Polycythemia vera - JAK2 negative   CURRENT THERAPY:  Phlebotomy to maintain a hematocrit below 40%  Aspirin 81 mg by mouth daily   INTERIM HISTORY:  Chase Gutierrez come back for followup. He just got back from [place]. He'll be headed back down there tomorrow. He spends the winter down there.    He has done well with phlebotomies. He really does well with his hematocrit below 40%.    He does feel a little more tired. He has not been phlebotomized for a couple months. He probably is due for one.    He is iron deficient. Yet, he still is able to produce red cells.    His blood sugars are doing better. He does his CPAP for the sleep apnea.    He's had no bleeding. Then no change in bowel or bladder habits.   PHYSICAL EXAMINATION:  Somewhat obese white gentleman in no obvious distress. Vital signs show temperature of 97.5. Pulse 89. Blood pressure 155/90. Weight is 242 pounds. Head and neck exam shows no conjunctival inflammation. There's no adenopathy in the neck. Lungs are clear. Cardiac exam regular rate and rhythm with no murmurs rubs or bruits. Abdomen mildly obese but soft. Has good bowel sounds. There is a palpable hepato- splenomegaly megaly extremities no clubbing cyanosis or edema. Neurological exam shows no focal neurological deficits.   LABORATORY STUDIES:  White cell count 8.9. Hemoglobin 13.4. 222. MCV is 69.       IMPRESSION:  Chase Gutierrez is a 63 year old gentleman with polycythemia. He will be phlebotomized today.    We will plan to get him back in about 6 weeks. He'll be in down floor again. I think we can probably wait for 6 weeks.   Volanda Napoleon, MD 11/02/2013

## 2013-12-17 ENCOUNTER — Ambulatory Visit (HOSPITAL_BASED_OUTPATIENT_CLINIC_OR_DEPARTMENT_OTHER): Payer: 59 | Admitting: Hematology & Oncology

## 2013-12-17 ENCOUNTER — Other Ambulatory Visit (HOSPITAL_BASED_OUTPATIENT_CLINIC_OR_DEPARTMENT_OTHER): Payer: 59 | Admitting: Lab

## 2013-12-17 ENCOUNTER — Encounter (INDEPENDENT_AMBULATORY_CARE_PROVIDER_SITE_OTHER): Payer: Self-pay

## 2013-12-17 ENCOUNTER — Encounter: Payer: Self-pay | Admitting: Hematology & Oncology

## 2013-12-17 ENCOUNTER — Ambulatory Visit (HOSPITAL_BASED_OUTPATIENT_CLINIC_OR_DEPARTMENT_OTHER): Payer: 59

## 2013-12-17 VITALS — BP 151/88 | HR 85 | Temp 96.8°F | Resp 12

## 2013-12-17 VITALS — BP 136/77 | HR 80 | Temp 97.9°F | Resp 18 | Ht 68.0 in | Wt 248.0 lb

## 2013-12-17 DIAGNOSIS — D45 Polycythemia vera: Secondary | ICD-10-CM

## 2013-12-17 LAB — CBC WITH DIFFERENTIAL (CANCER CENTER ONLY)
BASO#: 0.3 10*3/uL — ABNORMAL HIGH (ref 0.0–0.2)
BASO%: 3.5 % — ABNORMAL HIGH (ref 0.0–2.0)
EOS%: 6.2 % (ref 0.0–7.0)
Eosinophils Absolute: 0.5 10*3/uL (ref 0.0–0.5)
HEMATOCRIT: 44.3 % (ref 38.7–49.9)
HGB: 13.7 g/dL (ref 13.0–17.1)
LYMPH#: 1.9 10*3/uL (ref 0.9–3.3)
LYMPH%: 23 % (ref 14.0–48.0)
MCH: 21.7 pg — ABNORMAL LOW (ref 28.0–33.4)
MCHC: 30.9 g/dL — AB (ref 32.0–35.9)
MCV: 70 fL — AB (ref 82–98)
MONO#: 0.8 10*3/uL (ref 0.1–0.9)
MONO%: 9.1 % (ref 0.0–13.0)
NEUT#: 4.8 10*3/uL (ref 1.5–6.5)
NEUT%: 58.2 % (ref 40.0–80.0)
Platelets: 261 10*3/uL (ref 145–400)
RBC: 6.32 10*6/uL — ABNORMAL HIGH (ref 4.20–5.70)
RDW: 19.6 % — AB (ref 11.1–15.7)
WBC: 8.2 10*3/uL (ref 4.0–10.0)

## 2013-12-17 LAB — COMPREHENSIVE METABOLIC PANEL
ALT: 22 U/L (ref 0–53)
AST: 20 U/L (ref 0–37)
Albumin: 4.4 g/dL (ref 3.5–5.2)
Alkaline Phosphatase: 95 U/L (ref 39–117)
BUN: 26 mg/dL — AB (ref 6–23)
CALCIUM: 9.7 mg/dL (ref 8.4–10.5)
CHLORIDE: 102 meq/L (ref 96–112)
CO2: 29 meq/L (ref 19–32)
Creatinine, Ser: 1.18 mg/dL (ref 0.50–1.35)
Glucose, Bld: 150 mg/dL — ABNORMAL HIGH (ref 70–99)
Potassium: 4.6 mEq/L (ref 3.5–5.3)
Sodium: 141 mEq/L (ref 135–145)
Total Bilirubin: 0.3 mg/dL (ref 0.2–1.2)
Total Protein: 7.1 g/dL (ref 6.0–8.3)

## 2013-12-17 LAB — LACTATE DEHYDROGENASE: LDH: 228 U/L (ref 94–250)

## 2013-12-17 LAB — IRON AND TIBC CHCC
%SAT: 6 % — ABNORMAL LOW (ref 20–55)
Iron: 29 ug/dL — ABNORMAL LOW (ref 42–163)
TIBC: 480 ug/dL — ABNORMAL HIGH (ref 202–409)
UIBC: 451 ug/dL — ABNORMAL HIGH (ref 117–376)

## 2013-12-17 NOTE — Progress Notes (Signed)
  Chase Gutierrez presents today for phlebotomy per MD orders. Phlebotomy procedure started at [place] and ended at 0857. 538mL removed. Patient observed for 30 minutes after procedure without any incident. Patient tolerated procedure well. IV needle removed intact.

## 2013-12-17 NOTE — Progress Notes (Signed)
Hematology and Oncology Follow Up Visit  Chase Gutierrez [id number] Jul 12, 1951 63 y.o. 12/17/2013   Principle Diagnosis:    polycythemia vera-JAK2 negative    Current Therapy:    Phlebotomy to maintain hematocrit below 40%  Aspirin 81 mg by mouth daily     Interim History:  Mr.  Chase Gutierrez is back for followup. He feels tired. He feels that his blood is up. He  knows when his blood count starts going up He is still having some issues with his family. He tried to work through these the best that he can. It has been stressful for him.  His wife recently had gallbladder surgery. She took on her recover from this.  He does wear his CPAP. This is helped.  We have been iron deficient. His last ferritin in March was 9.  His blood sugars have been doing okay.  He's had no headaches. He's had no visual issues. 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 He says his neuropathy gets worse when his blood count goes up.  Medications: Current outpatient prescriptions:ALPRAZolam (XANAX) 0.5 MG tablet, Take 2 tablets (1 mg total) by mouth 2 (two) times daily as needed., Disp: 60 tablet, Rfl: 2;  aspirin 81 MG EC tablet, Take 81 mg by mouth daily., Disp: , Rfl: ;  COLCRYS 0.6 MG tablet, Take 0.6 mg by mouth as needed. , Disp: , Rfl: ;  hydrochlorothiazide (HYDRODIURIL) 12.5 MG tablet, Take 12.5 mg by mouth daily.  , Disp: , Rfl:  ibuprofen (ADVIL,MOTRIN) 400 MG tablet, Take 400 mg by mouth as needed.  , Disp: , Rfl: ;  levothyroxine (SYNTHROID) 25 MCG tablet, Take 25 mcg by mouth daily.  , Disp: , Rfl: ;  lisinopril (PRINIVIL,ZESTRIL) 20 MG tablet, Take 10 mg by mouth daily. , Disp: , Rfl: ;  Multiple Vitamin (MULTIVITAMIN) capsule, Take 1 capsule by mouth daily.  , Disp: , Rfl: ;  naproxen (NAPROSYN) 500 MG tablet, Take 250 mg by mouth as needed. , Disp: , Rfl:  NUVIGIL 250 MG tablet, Take 125 mg by mouth as needed. Now taking 1/2 tab., Disp: , Rfl:   Allergies: No Known Allergies  Past Medical  History, Surgical history, Social history, and Family History were reviewed and updated.  Review of Systems: As above  Physical Exam:  height is 5\' 8"  (1.727 m) and weight is 248 lb (112.492 kg). His oral temperature is 97.9 F (36.6 C). His blood pressure is 136/77 and his pulse is 80. His respiration is 18.   Somewhat obese white gentleman. Head and neck exam shows no ocular or oral lesions. He has no palpable cervical or supraclavicular lymph nodes. Lungs are clear. Cardiac exam regular rate and rhythm with no murmurs rubs or bruits. Abdomen is soft. He is moderately obese. She has good bowel sounds. There is no fluid wave. There is no palpable liver or spleen tip. Back exam no tenderness over the spine ribs or hips. Extremities shows some trace edema down in his ankles. Has good pulses. Has good range of motion of his joints. Skin exam shows no rashes. Neurological exam is nonfocal.  Lab Results  Component Value Date   WBC 8.2 12/17/2013   HGB 13.7 12/17/2013   HCT 44.3 12/17/2013   MCV 70* 12/17/2013   PLT 261 12/17/2013     Chemistry      Component Value Date/Time   NA 138 04/02/2013 0825   NA 141 07/24/2012 0813   K 4.4 04/02/2013 0825  K 4.1 07/24/2012 0813   CL 100 04/02/2013 0825   CL 106 07/24/2012 0813   CO2 32 04/02/2013 0825   CO2 26 07/24/2012 0813   BUN 24* 04/02/2013 0825   BUN 18 07/24/2012 0813   CREATININE 1.5* 04/02/2013 0825   CREATININE 1.16 07/24/2012 0813      Component Value Date/Time   CALCIUM 9.1 04/02/2013 0825   CALCIUM 9.7 07/24/2012 0813   ALKPHOS 98* 04/02/2013 0825   ALKPHOS 100 07/24/2012 0813   AST 29 04/02/2013 0825   AST 27 07/24/2012 0813   ALT 33 04/02/2013 0825   ALT 36 07/24/2012 0813   BILITOT 0.60 04/02/2013 0825   BILITOT 0.3 07/24/2012 0813         Impression and Plan: Chase Gutierrez is 63 year old gentleman with polycythemia. He has done well. Phlebotomies make him feel better. He clearly needs be phlebotomized today.  I think we might also  have to phlebotomize him next week. Again he really likes to have his blood count below 40.  I will plan to see him back myself in another 6 weeks or so.   Volanda Napoleon, MD 4/20/20158:44 AM

## 2013-12-17 NOTE — Patient Instructions (Signed)
Therapeutic Phlebotomy Therapeutic phlebotomy is the controlled removal of blood from your body for the purpose of treating a medical condition. It is similar to donating blood. Usually, about a pint (470 mL) of blood is removed. The average adult has 9 to 12 pints (4.3 to 5.7 L) of blood. Therapeutic phlebotomy may be used to treat the following medical conditions:  Hemochromatosis. This is a condition in which there is too much iron in the blood.  Polycythemia vera. This is a condition in which there are too many red cells in the blood.  Porphyria cutanea tarda. This is a disease usually passed from one generation to the next (inherited). It is a condition in which an important part of hemoglobin is not made properly. This results in the build up of abnormal amounts of porphyrins in the body.  Sickle cell disease. This is an inherited disease. It is a condition in which the red blood cells form an abnormal crescent shape rather than a round shape. LET YOUR CAREGIVER KNOW ABOUT:  Allergies.  Medicines taken including herbs, eyedrops, over-the-counter medicines, and creams.  Use of steroids (by mouth or creams).  Previous problems with anesthetics or numbing medicine.  History of blood clots.  History of bleeding or blood problems.  Previous surgery.  Possibility of pregnancy, if this applies. RISKS AND COMPLICATIONS This is a simple and safe procedure. Problems are unlikely. However, problems can occur and may include:  Nausea or lightheadedness.  Low blood pressure.  Soreness, bleeding, swelling, or bruising at the needle insertion site.  Infection. BEFORE THE PROCEDURE  This is a procedure that can be done as an outpatient. Confirm the time that you need to arrive for your procedure. Confirm whether there is a need to fast or withhold any medications. It is helpful to wear clothing with sleeves that can be raised above the elbow. A blood sample may be done to determine the  amount of red blood cells or iron in your blood. Plan ahead of time to have someone drive you home after the procedure. PROCEDURE The entire procedure from preparation through recovery takes about 1 hour. The actual collection takes about 10 to 15 minutes.  A needle will be inserted into your vein.  Tubing and a collection bag will be attached to that needle.  Blood will flow through the needle and tubing into the collection bag.  You may be asked to open and close your hand slowly and continuously during the entire collection.  Once the specified amount of blood has been removed from your body, the collection bag and tubing will be clamped.  The needle will be removed.  Pressure will be held on the site of the needle insertion to stop the bleeding. Then a bandage will be placed over the needle insertion site. AFTER THE PROCEDURE  Your recovery will be assessed and monitored. If there are no problems, as an outpatient, you should be able to go home shortly after the procedure.  Document Released: 01/18/2011 Document Revised: 11/08/2011 Document Reviewed: 01/18/2011 [employer] Patient Information 2013 [employer].  

## 2013-12-19 ENCOUNTER — Encounter: Payer: Self-pay | Admitting: *Deleted

## 2013-12-21 ENCOUNTER — Encounter: Payer: Self-pay | Admitting: Gastroenterology

## 2013-12-26 ENCOUNTER — Ambulatory Visit: Payer: 59

## 2013-12-26 NOTE — Progress Notes (Signed)
Attempted to perform phlebotomy for pt but unable to access IV successfully.  This nurse and Neita Garnet RN tried.  Pt states he would come back tomorrow and try again.  Dr. Marin Olp made aware.

## 2013-12-27 ENCOUNTER — Ambulatory Visit (HOSPITAL_BASED_OUTPATIENT_CLINIC_OR_DEPARTMENT_OTHER): Payer: 59

## 2013-12-27 DIAGNOSIS — D45 Polycythemia vera: Secondary | ICD-10-CM

## 2013-12-27 NOTE — Progress Notes (Signed)
Chase Gutierrez presents today for phlebotomy per MD orders. Phlebotomy procedure started at 0835 and ended at 0850. 500 mls removed. Patient observed for 30 minutes after procedure without any incident. Patient tolerated procedure well. IV needle removed intact.

## 2013-12-27 NOTE — Patient Instructions (Signed)

## 2014-01-17 ENCOUNTER — Ambulatory Visit (HOSPITAL_BASED_OUTPATIENT_CLINIC_OR_DEPARTMENT_OTHER): Payer: 59 | Admitting: Lab

## 2014-01-17 DIAGNOSIS — D45 Polycythemia vera: Secondary | ICD-10-CM

## 2014-01-17 LAB — CBC WITH DIFFERENTIAL (CANCER CENTER ONLY)
BASO#: 0.2 10*3/uL (ref 0.0–0.2)
BASO%: 1.9 % (ref 0.0–2.0)
EOS ABS: 0.6 10*3/uL — AB (ref 0.0–0.5)
EOS%: 6.1 % (ref 0.0–7.0)
HCT: 36.1 % — ABNORMAL LOW (ref 38.7–49.9)
HEMOGLOBIN: 11 g/dL — AB (ref 13.0–17.1)
LYMPH#: 1.5 10*3/uL (ref 0.9–3.3)
LYMPH%: 16.5 % (ref 14.0–48.0)
MCH: 21.6 pg — ABNORMAL LOW (ref 28.0–33.4)
MCHC: 30.5 g/dL — ABNORMAL LOW (ref 32.0–35.9)
MCV: 71 fL — ABNORMAL LOW (ref 82–98)
MONO#: 0.6 10*3/uL (ref 0.1–0.9)
MONO%: 6.7 % (ref 0.0–13.0)
NEUT#: 6.3 10*3/uL (ref 1.5–6.5)
NEUT%: 68.8 % (ref 40.0–80.0)
PLATELETS: 278 10*3/uL (ref 145–400)
RBC: 5.1 10*6/uL (ref 4.20–5.70)
RDW: 16.5 % — ABNORMAL HIGH (ref 11.1–15.7)
WBC: 9.2 10*3/uL (ref 4.0–10.0)

## 2014-01-17 LAB — CMP (CANCER CENTER ONLY)
ALT: 23 U/L (ref 10–47)
AST: 19 U/L (ref 11–38)
Albumin: 3.5 g/dL (ref 3.3–5.5)
Alkaline Phosphatase: 98 U/L — ABNORMAL HIGH (ref 26–84)
BUN, Bld: 21 mg/dL (ref 7–22)
CALCIUM: 8.4 mg/dL (ref 8.0–10.3)
CO2: 31 meq/L (ref 18–33)
CREATININE: 1.2 mg/dL (ref 0.6–1.2)
Chloride: 102 mEq/L (ref 98–108)
GLUCOSE: 150 mg/dL — AB (ref 73–118)
Potassium: 4.7 mEq/L (ref 3.3–4.7)
SODIUM: 145 meq/L (ref 128–145)
TOTAL PROTEIN: 6.8 g/dL (ref 6.4–8.1)
Total Bilirubin: 0.5 mg/dl (ref 0.20–1.60)

## 2014-01-17 LAB — IRON AND TIBC CHCC
%SAT: 4 % — AB (ref 20–55)
IRON: 19 ug/dL — AB (ref 42–163)
TIBC: 423 ug/dL — ABNORMAL HIGH (ref 202–409)
UIBC: 404 ug/dL — ABNORMAL HIGH (ref 117–376)

## 2014-01-17 LAB — FERRITIN CHCC: Ferritin: 7 ng/ml — ABNORMAL LOW (ref 22–316)

## 2014-01-17 NOTE — Progress Notes (Signed)
Phlebotomy procedure not performed due to HCT of 36.1. Per Dr Marin Olp, pt should not phlebotomies if HCT is less than 40. Pt stated he has been feeling "sluggish" and nursing staff informed him to contact his primary care physician if this did not improve.

## 2014-01-19 LAB — CALRETICULIN (CALR) MUTATION ANALYSIS: CALR Mutation (exon 9): NOT DETECTED

## 2014-01-29 ENCOUNTER — Ambulatory Visit: Payer: 59 | Admitting: Hematology & Oncology

## 2014-01-29 ENCOUNTER — Other Ambulatory Visit: Payer: 59 | Admitting: Lab

## 2014-01-29 ENCOUNTER — Other Ambulatory Visit: Payer: Self-pay | Admitting: Hematology & Oncology

## 2014-01-31 ENCOUNTER — Other Ambulatory Visit: Payer: Self-pay | Admitting: *Deleted

## 2014-01-31 DIAGNOSIS — D45 Polycythemia vera: Secondary | ICD-10-CM

## 2014-02-01 ENCOUNTER — Other Ambulatory Visit (HOSPITAL_BASED_OUTPATIENT_CLINIC_OR_DEPARTMENT_OTHER): Payer: 59 | Admitting: Lab

## 2014-02-01 ENCOUNTER — Ambulatory Visit (HOSPITAL_BASED_OUTPATIENT_CLINIC_OR_DEPARTMENT_OTHER): Payer: 59

## 2014-02-01 ENCOUNTER — Telehealth: Payer: Self-pay | Admitting: Internal Medicine

## 2014-02-01 ENCOUNTER — Ambulatory Visit (HOSPITAL_BASED_OUTPATIENT_CLINIC_OR_DEPARTMENT_OTHER): Payer: 59 | Admitting: Hematology & Oncology

## 2014-02-01 ENCOUNTER — Encounter: Payer: Self-pay | Admitting: Hematology & Oncology

## 2014-02-01 VITALS — BP 96/67 | HR 73 | Temp 98.1°F | Resp 20 | Ht 68.0 in | Wt 250.0 lb

## 2014-02-01 VITALS — BP 106/66 | HR 68 | Resp 18

## 2014-02-01 DIAGNOSIS — D509 Iron deficiency anemia, unspecified: Secondary | ICD-10-CM

## 2014-02-01 DIAGNOSIS — D45 Polycythemia vera: Secondary | ICD-10-CM

## 2014-02-01 DIAGNOSIS — I509 Heart failure, unspecified: Secondary | ICD-10-CM

## 2014-02-01 LAB — CBC WITH DIFFERENTIAL (CANCER CENTER ONLY)
BASO#: 0.4 10*3/uL — ABNORMAL HIGH (ref 0.0–0.2)
BASO%: 3.2 % — AB (ref 0.0–2.0)
EOS%: 2.2 % (ref 0.0–7.0)
Eosinophils Absolute: 0.2 10*3/uL (ref 0.0–0.5)
HEMATOCRIT: 39.4 % (ref 38.7–49.9)
HGB: 12.3 g/dL — ABNORMAL LOW (ref 13.0–17.1)
LYMPH#: 1.5 10*3/uL (ref 0.9–3.3)
LYMPH%: 14.1 % (ref 14.0–48.0)
MCH: 21.2 pg — ABNORMAL LOW (ref 28.0–33.4)
MCHC: 31.2 g/dL — AB (ref 32.0–35.9)
MCV: 68 fL — ABNORMAL LOW (ref 82–98)
MONO#: 0.9 10*3/uL (ref 0.1–0.9)
MONO%: 8 % (ref 0.0–13.0)
NEUT#: 7.8 10*3/uL — ABNORMAL HIGH (ref 1.5–6.5)
NEUT%: 72.5 % (ref 40.0–80.0)
PLATELETS: 276 10*3/uL (ref 145–400)
RBC: 5.8 10*6/uL — ABNORMAL HIGH (ref 4.20–5.70)
RDW: 18.6 % — AB (ref 11.1–15.7)
WBC: 10.8 10*3/uL — AB (ref 4.0–10.0)

## 2014-02-01 LAB — TECHNOLOGIST REVIEW CHCC SATELLITE

## 2014-02-01 LAB — IRON AND TIBC CHCC
%SAT: 5 % — ABNORMAL LOW (ref 20–55)
IRON: 23 ug/dL — AB (ref 42–163)
TIBC: 485 ug/dL — ABNORMAL HIGH (ref 202–409)
UIBC: 462 ug/dL — AB (ref 117–376)

## 2014-02-01 LAB — FERRITIN CHCC: FERRITIN: 13 ng/mL — AB (ref 22–316)

## 2014-02-01 NOTE — Patient Instructions (Signed)
Therapeutic Phlebotomy Therapeutic phlebotomy is the controlled removal of blood from your body for the purpose of treating a medical condition. It is similar to donating blood. Usually, about a pint (470 mL) of blood is removed. The average adult has 9 to 12 pints (4.3 to 5.7 L) of blood. Therapeutic phlebotomy may be used to treat the following medical conditions:  Hemochromatosis. This is a condition in which there is too much iron in the blood.  Polycythemia vera. This is a condition in which there are too many red cells in the blood.  Porphyria cutanea tarda. This is a disease usually passed from one generation to the next (inherited). It is a condition in which an important part of hemoglobin is not made properly. This results in the build up of abnormal amounts of porphyrins in the body.  Sickle cell disease. This is an inherited disease. It is a condition in which the red blood cells form an abnormal crescent shape rather than a round shape. LET YOUR CAREGIVER KNOW ABOUT:  Allergies.  Medicines taken including herbs, eyedrops, over-the-counter medicines, and creams.  Use of steroids (by mouth or creams).  Previous problems with anesthetics or numbing medicine.  History of blood clots.  History of bleeding or blood problems.  Previous surgery.  Possibility of pregnancy, if this applies. RISKS AND COMPLICATIONS This is a simple and safe procedure. Problems are unlikely. However, problems can occur and may include:  Nausea or lightheadedness.  Low blood pressure.  Soreness, bleeding, swelling, or bruising at the needle insertion site.  Infection. BEFORE THE PROCEDURE  This is a procedure that can be done as an outpatient. Confirm the time that you need to arrive for your procedure. Confirm whether there is a need to fast or withhold any medications. It is helpful to wear clothing with sleeves that can be raised above the elbow. A blood sample may be done to determine the  amount of red blood cells or iron in your blood. Plan ahead of time to have someone drive you home after the procedure. PROCEDURE The entire procedure from preparation through recovery takes about 1 hour. The actual collection takes about 10 to 15 minutes.  A needle will be inserted into your vein.  Tubing and a collection bag will be attached to that needle.  Blood will flow through the needle and tubing into the collection bag.  You may be asked to open and close your hand slowly and continuously during the entire collection.  Once the specified amount of blood has been removed from your body, the collection bag and tubing will be clamped.  The needle will be removed.  Pressure will be held on the site of the needle insertion to stop the bleeding. Then a bandage will be placed over the needle insertion site. AFTER THE PROCEDURE  Your recovery will be assessed and monitored. If there are no problems, as an outpatient, you should be able to go home shortly after the procedure.  Document Released: 01/18/2011 Document Revised: 11/08/2011 Document Reviewed: 01/18/2011 ExitCare Patient Information 2014 [employer].  

## 2014-02-01 NOTE — Telephone Encounter (Signed)
New Message  Pt called states that he has retained fluid// having trouble breathing.. Was seen by general practitioner on 01/30/2014 who gave him lasix. He has lost 18 lbs since 01/30/2014 and was given a test to determine if he had CHF, it came back negative. Pt states that he has also seen his hematologist who has referred him back to Cardiologist.. Last BP was 90/65. Please call back to discuss sooner OV with Dr. Harrington Challenger. Offered Pt an appt with NP or PA.Marland Kitchen Pt requests a call back to the nurse to determine if seeing an alternate is suitable for his condition.

## 2014-02-01 NOTE — Progress Notes (Signed)
Hematology and Oncology Follow Up Visit  Chase Gutierrez [id number] 16-Aug-1951 63 y.o. 02/01/2014   Principle Diagnosis:   Polycythemia vera - JAK2 NEGATIVE  Current Therapy:    Phlebotomy to maintain hematocrit below 40%  Aspirin 81 mg by mouth daily     Interim History:  Mr.  Gutierrez is back for followup. He had problems this week with congestive heart failure. He began to feel short of breath. He began to gain weight. Had more swelling in his feet.  He saw his family doctor. Dr. Brigitte Pulse did a chest x-ray and found that he had congestive heart failure. He had pulmonary edema. Dr. Pearletha Furl. Chase Gutierrez on Lasix. Chase Gutierrez has lost about 18 pounds so far. He feels a lot better. He does have a cardiologist. He will see her next week. 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  He does have sleep apnea. I don't think he's been using the machine for his sleep apnea.  We have controlled his polycythemia him very well.  He is iron deficient. When I saw him and make him his ferritin was down to 7. Does have hypo-thyroidism. This also is being managed by Dr. Brigitte Pulse.  Currently, he does feel better. He's had no cough. He's had no leg swelling. He's had no fever.  Medications: Current outpatient prescriptions:ALPRAZolam (XANAX) 0.5 MG tablet, TAKE TWO TABLETS TWICE DAILY AS NEEDED, Disp: 60 tablet, Rfl: 2;  aspirin 81 MG EC tablet, Take 81 mg by mouth daily., Disp: , Rfl: ;  COLCRYS 0.6 MG tablet, Take 0.6 mg by mouth as needed. , Disp: , Rfl: ;  furosemide (LASIX) 40 MG tablet, Take 40 mg by mouth daily., Disp: , Rfl: ;  hydrochlorothiazide (HYDRODIURIL) 12.5 MG tablet, Take 12.5 mg by mouth daily.  , Disp: , Rfl:  ibuprofen (ADVIL,MOTRIN) 400 MG tablet, Take 400 mg by mouth as needed.  , Disp: , Rfl: ;  levothyroxine (SYNTHROID) 25 MCG tablet, Take 25 mcg by mouth daily.  , Disp: , Rfl: ;  lisinopril (PRINIVIL,ZESTRIL) 20 MG tablet, Take 10 mg by mouth daily. , Disp: , Rfl: ;  Multiple Vitamin (MULTIVITAMIN)  capsule, Take 1 capsule by mouth daily.  , Disp: , Rfl: ;  naproxen (NAPROSYN) 500 MG tablet, Take 250 mg by mouth as needed. , Disp: , Rfl:  NUVIGIL 250 MG tablet, Take 125 mg by mouth as needed. Now taking 1/2 tab., Disp: , Rfl: ;  potassium chloride SA (K-DUR,KLOR-CON) 20 MEQ tablet, Take 20 mEq by mouth once. , Disp: , Rfl:   Allergies: No Known Allergies  Past Medical History, Surgical history, Social history, and Family History were reviewed and updated.  Review of Systems: As above  Physical Exam:  height is 5\' 8"  (1.727 m) and weight is 250 lb (113.399 kg). His oral temperature is 98.1 F (36.7 C). His blood pressure is 96/67 and his pulse is 73. His respiration is 20.   Well-developed and well-nourished gentleman. His lungs are clear. He has no rales wheeze or rhonchi. Cardiac exam regular rate and rhythm with no murmurs rubs or bruits. There may be a 1/6 systolic murmur. Neck is supple. There is no jugular venous distention. Abdomen is soft. Has good bowel sounds. There is no fluid wave. There is no palpable liver or spleen tip. Back exam no tenderness over the spine ribs or hips. Extremities shows no clubbing cyanosis or edema. Neurological exam shows no focal neurological deficits. Skin exam no rashes.  Lab Results  Component Value  Date   WBC 10.8* 02/01/2014   HGB 12.3* 02/01/2014   HCT 39.4 02/01/2014   MCV 68* 02/01/2014   PLT 276 02/01/2014     Chemistry      Component Value Date/Time   NA 145 01/17/2014 0910   NA 141 12/17/2013 0807   K 4.7 01/17/2014 0910   K 4.6 12/17/2013 0807   CL 102 01/17/2014 0910   CL 102 12/17/2013 0807   CO2 31 01/17/2014 0910   CO2 29 12/17/2013 0807   BUN 21 01/17/2014 0910   BUN 26* 12/17/2013 0807   CREATININE 1.2 01/17/2014 0910   CREATININE 1.18 12/17/2013 0807      Component Value Date/Time   CALCIUM 8.4 01/17/2014 0910   CALCIUM 9.7 12/17/2013 0807   ALKPHOS 98* 01/17/2014 0910   ALKPHOS 95 12/17/2013 0807   AST 19 01/17/2014 0910   AST 20  12/17/2013 0807   ALT 23 01/17/2014 0910   ALT 22 12/17/2013 0807   BILITOT 0.50 01/17/2014 0910   BILITOT 0.3 12/17/2013 0807         Impression and Plan: Chase Gutierrez is 63 year old gentleman with polycythemia. We had done very nicely with the polycythemia.  I think his congestive heart failure is more related to his other health issues. I don't believe this is related to him having polycythemia since we have controlled this.  I think we probably should phlebotomize him. He goes for a bridge tournament next week. He feels better after he has his phlebotomies.  We will see what the workup shows a for his congestive heart failure. Again, he will see his cardiologist for this.  I'll plan to get him back in about a month.   Volanda Napoleon, MD 6/5/20159:53 AM

## 2014-02-01 NOTE — Telephone Encounter (Signed)
Pt called back he states he prefers to see Dr. Harrington Challenger instead of  PA/NP. Pt states he would like to have an appointment with Dr. Harrington Challenger In  2 weeks if possible. Pt is aware that MD does not have openings before august 3 rd.  Pt would like for this message to be send to Dr. Harrington Challenger to see if she can see him sooner.

## 2014-02-01 NOTE — Progress Notes (Signed)
Chase Gutierrez presents today for phlebotomy per MD orders. Phlebotomy procedure started at 0945 and ended at 0955. 500 mls removed. Patient observed for 30 minutes after procedure without any incident. Patient tolerated procedure well. IV needle removed intact.

## 2014-02-01 NOTE — Telephone Encounter (Signed)
Left pt a message to call back. 

## 2014-02-05 NOTE — Telephone Encounter (Signed)
LMTRC

## 2014-02-05 NOTE — Telephone Encounter (Signed)
Please put as add on for Fri at 4:30

## 2014-02-07 ENCOUNTER — Telehealth: Payer: Self-pay | Admitting: Internal Medicine

## 2014-02-07 NOTE — Telephone Encounter (Signed)
New Message:  Pt is requesting to speak to Hilda Blades to get worked in to see Dr. Harrington Challenger.

## 2014-02-07 NOTE — Telephone Encounter (Signed)
I spoke with pt & he is out of town that is why he cancelled his appt for 02/08/14. He states his pcp has drawn lab work & wanted to see Dr. Harrington Challenger about labs. No cardiac issues stated at this time.  Pt would like appt next week or the next. I told him Dr. Harrington Challenger did not have appt opening until August.  He states that as she is a personal friend he would give her a call. He was very kind in his conversation.  Forwarded to Dr. Gean Birchwood RN

## 2014-02-07 NOTE — Telephone Encounter (Signed)
lmom with appt.

## 2014-02-08 ENCOUNTER — Ambulatory Visit: Payer: 59 | Admitting: Internal Medicine

## 2014-02-08 NOTE — Telephone Encounter (Signed)
I am in clinic part of day on 6/29  Give him option tocome in that day .

## 2014-02-12 ENCOUNTER — Encounter: Payer: Self-pay | Admitting: Internal Medicine

## 2014-02-12 ENCOUNTER — Telehealth: Payer: Self-pay | Admitting: *Deleted

## 2014-02-12 NOTE — Telephone Encounter (Signed)
Message from dr Janean Sark have him set up to see me on Thursday morning at 9.     He also needs to be set up for labs that day I will order after see    He also needs an echo (history of AV replacement, SOB)     Follow up scheduled and pt made aware.

## 2014-02-12 NOTE — Telephone Encounter (Signed)
Follow Up ° °Pt returned call//  °

## 2014-02-12 NOTE — Telephone Encounter (Signed)
Appointment scheduled on 02/25/14. Pt aware.

## 2014-02-13 NOTE — Telephone Encounter (Signed)
This encounter was created in error - please disregard.

## 2014-02-25 ENCOUNTER — Ambulatory Visit (INDEPENDENT_AMBULATORY_CARE_PROVIDER_SITE_OTHER): Payer: 59 | Admitting: Internal Medicine

## 2014-02-25 ENCOUNTER — Encounter: Payer: Self-pay | Admitting: Internal Medicine

## 2014-02-25 VITALS — BP 120/86 | HR 87 | Ht 68.0 in | Wt 249.6 lb

## 2014-02-25 DIAGNOSIS — E785 Hyperlipidemia, unspecified: Secondary | ICD-10-CM

## 2014-02-25 DIAGNOSIS — I10 Essential (primary) hypertension: Secondary | ICD-10-CM

## 2014-02-25 DIAGNOSIS — I359 Nonrheumatic aortic valve disorder, unspecified: Secondary | ICD-10-CM

## 2014-02-25 DIAGNOSIS — D45 Polycythemia vera: Secondary | ICD-10-CM

## 2014-02-25 DIAGNOSIS — I119 Hypertensive heart disease without heart failure: Secondary | ICD-10-CM

## 2014-02-25 DIAGNOSIS — R002 Palpitations: Secondary | ICD-10-CM

## 2014-02-26 ENCOUNTER — Other Ambulatory Visit: Payer: Self-pay | Admitting: *Deleted

## 2014-02-26 LAB — CBC
HCT: 36 % — ABNORMAL LOW (ref 39.0–52.0)
HEMOGLOBIN: 11.3 g/dL — AB (ref 13.0–17.0)
MCHC: 31.4 g/dL (ref 30.0–36.0)
MCV: 66.4 fl — ABNORMAL LOW (ref 78.0–100.0)
Platelets: 230 10*3/uL (ref 150.0–400.0)
RBC: 5.43 Mil/uL (ref 4.22–5.81)
RDW: 17.5 % — ABNORMAL HIGH (ref 11.5–15.5)
WBC: 10.6 10*3/uL — ABNORMAL HIGH (ref 4.0–10.5)

## 2014-02-26 LAB — BASIC METABOLIC PANEL
BUN: 30 mg/dL — ABNORMAL HIGH (ref 6–23)
CHLORIDE: 101 meq/L (ref 96–112)
CO2: 28 meq/L (ref 19–32)
Calcium: 9.5 mg/dL (ref 8.4–10.5)
Creatinine, Ser: 1.6 mg/dL — ABNORMAL HIGH (ref 0.4–1.5)
GFR: 46.27 mL/min — AB (ref >60.00)
GLUCOSE: 109 mg/dL — AB (ref 70–99)
POTASSIUM: 3.7 meq/L (ref 3.5–5.1)
SODIUM: 138 meq/L (ref 135–145)

## 2014-02-26 LAB — BRAIN NATRIURETIC PEPTIDE: Pro B Natriuretic peptide (BNP): 93 pg/mL (ref 0.0–100.0)

## 2014-02-26 MED ORDER — FUROSEMIDE 40 MG PO TABS: 40.0000 mg | ORAL_TABLET | ORAL | Status: DC

## 2014-02-26 NOTE — Progress Notes (Signed)
HPI          Patient is a 63 year old with a history of aortic stenosis (s/p AVR in 1601), HTN, diastolic CHF, obesity, sleep apnea (uses CPAP). I saw him in Feb 2013 HE called recently  Chase Gutierrez he was more bloated, SOB Had been seen by [place] on lasix.  Lost signif amount of wt in a few days. Initially felt good  NOw bloated again.  Feels weak  Not dizzy.  Denies CP        No Known Allergies  Current Outpatient Prescriptions  Medication Sig Dispense Refill  . ALPRAZolam (XANAX) 0.5 MG tablet TAKE TWO TABLETS TWICE DAILY AS NEEDED  60 tablet  2  . amitriptyline (ELAVIL) 25 MG tablet       . aspirin 81 MG EC tablet Take 81 mg by mouth daily.      Marland Kitchen COLCRYS 0.6 MG tablet Take 0.6 mg by mouth as needed.       . furosemide (LASIX) 40 MG tablet Take 40 mg by mouth daily.      . hydrochlorothiazide (HYDRODIURIL) 12.5 MG tablet Take 12.5 mg by mouth daily.        Marland Kitchen ibuprofen (ADVIL,MOTRIN) 400 MG tablet Take 400 mg by mouth as needed.        Marland Kitchen levothyroxine (SYNTHROID) 25 MCG tablet Take 25 mcg by mouth daily.        Marland Kitchen lisinopril (PRINIVIL,ZESTRIL) 20 MG tablet Take 10 mg by mouth daily.       . Multiple Vitamin (MULTIVITAMIN) capsule Take 1 capsule by mouth daily.        . naproxen (NAPROSYN) 500 MG tablet Take 250 mg by mouth as needed.       Marland Kitchen NUVIGIL 250 MG tablet Take 125 mg by mouth as needed. Now taking 1/2 tab.      . potassium chloride SA (K-DUR,KLOR-CON) 20 MEQ tablet Take 20 mEq by mouth once.        No current facility-administered medications for this visit.    Past Medical History  Diagnosis Date  . Depressed   . Allergic rhinitis   . Nephrolithiasis   . Dyslipidemia   . Aortic stenosis   . Hypertension   . OSA (obstructive sleep apnea)   . Sinus complaint   . Obesity   . Polycythemia vera(238.4) 06/16/2012  . Adenomatous colon polyp     2005    Past Surgical History  Procedure Laterality Date  . Tonsillectomy  1982, 1993  . Aortic valve replacement   10/15/2008  . Hemorrhoid surgery  1990    Family History  Problem Relation Age of Onset  . Coronary artery disease Mother   . Diabetes Father   . Coronary artery disease Father   . Lung cancer Father     History   Social History  . Marital Status: Divorced    Spouse Name: N/A    Number of Children: 1  . Years of Education: N/A   Occupational History  . attorney Unemployed   Social History Main Topics  . Smoking status: Never Smoker   . Smokeless tobacco: Never Used     Comment: never used  tobacco  . Alcohol Use: Yes     Comment: rare  . Drug Use: Not on file  . Sexual Activity: Not on file   Other Topics Concern  . Not on file   Social History Narrative   Lives with daughter.    Review  of Systems:  All systems reviewed.  They are negative to the above problem except as previously stated.  Vital Signs: BP 120/86  Pulse 87  Ht 5\' 8"  (1.727 m)  Wt 249 lb 9.6 oz (113.218 kg)  BMI 37.96 kg/m2  Physical Exam Patient is in NAD  HEENT:  Normocephalic, atraumatic. EOMI, PERRLA.  Neck: JVP is normal.  No bruits.  Lungs:Clear to auscultation. No rales no wheezes.  Heart: Regular rate and rhythm. Normal S1, S2. No S3.   No significant murmurs. PMI not displaced.  Abdomen:  Supple, nontender.Mildly distended  Extremities:   Good distal pulses throughout. No lower extremity edema.  Musculoskeletal :moving all extremities.  Neuro:   alert and oriented x3.  CN II-XII grossly intact.  EKG  S$ 87  ST depression inferior and lateral leads Assessment and Plan: 1.  SOB and edema.  I think this most likely represents diastolic CHF in setting of hyperdynamic LF   Will get echo to reconfirm CHeck labs to determine further lasix dosing Watch salt  2.  AV disease  S/p AVR  Get echo  3.  HTN  Adequate control  4.  Morbid obesity  COunselled on wt loss.  Patient is trying to lose.

## 2014-02-28 ENCOUNTER — Other Ambulatory Visit (HOSPITAL_BASED_OUTPATIENT_CLINIC_OR_DEPARTMENT_OTHER): Payer: 59 | Admitting: Lab

## 2014-02-28 ENCOUNTER — Encounter: Payer: Self-pay | Admitting: Hematology & Oncology

## 2014-02-28 ENCOUNTER — Ambulatory Visit: Payer: 59

## 2014-02-28 ENCOUNTER — Ambulatory Visit (HOSPITAL_BASED_OUTPATIENT_CLINIC_OR_DEPARTMENT_OTHER): Payer: 59 | Admitting: Hematology & Oncology

## 2014-02-28 ENCOUNTER — Telehealth: Payer: Self-pay | Admitting: Hematology & Oncology

## 2014-02-28 VITALS — BP 99/61 | HR 83 | Temp 97.7°F | Resp 18 | Ht 68.0 in | Wt 249.0 lb

## 2014-02-28 DIAGNOSIS — D45 Polycythemia vera: Secondary | ICD-10-CM

## 2014-02-28 LAB — CBC WITH DIFFERENTIAL (CANCER CENTER ONLY)
BASO#: 0.3 10*3/uL — ABNORMAL HIGH (ref 0.0–0.2)
BASO%: 3.7 % — ABNORMAL HIGH (ref 0.0–2.0)
EOS ABS: 0.5 10*3/uL (ref 0.0–0.5)
EOS%: 6.2 % (ref 0.0–7.0)
HCT: 35.5 % — ABNORMAL LOW (ref 38.7–49.9)
HGB: 10.8 g/dL — ABNORMAL LOW (ref 13.0–17.1)
LYMPH#: 1.7 10*3/uL (ref 0.9–3.3)
LYMPH%: 22.8 % (ref 14.0–48.0)
MCH: 20.8 pg — AB (ref 28.0–33.4)
MCHC: 30.4 g/dL — ABNORMAL LOW (ref 32.0–35.9)
MCV: 69 fL — AB (ref 82–98)
MONO#: 0.6 10*3/uL (ref 0.1–0.9)
MONO%: 8.1 % (ref 0.0–13.0)
NEUT#: 4.3 10*3/uL (ref 1.5–6.5)
NEUT%: 59.2 % (ref 40.0–80.0)
Platelets: 233 10*3/uL (ref 145–400)
RBC: 5.18 10*6/uL (ref 4.20–5.70)
RDW: 17.1 % — ABNORMAL HIGH (ref 11.1–15.7)
WBC: 7.3 10*3/uL (ref 4.0–10.0)

## 2014-02-28 LAB — COMPREHENSIVE METABOLIC PANEL
ALT: 21 U/L (ref 0–53)
AST: 21 U/L (ref 0–37)
Albumin: 4.3 g/dL (ref 3.5–5.2)
Alkaline Phosphatase: 81 U/L (ref 39–117)
BUN: 30 mg/dL — ABNORMAL HIGH (ref 6–23)
CO2: 29 meq/L (ref 19–32)
Calcium: 9.2 mg/dL (ref 8.4–10.5)
Chloride: 98 mEq/L (ref 96–112)
Creatinine, Ser: 1.37 mg/dL — ABNORMAL HIGH (ref 0.50–1.35)
GLUCOSE: 146 mg/dL — AB (ref 70–99)
Potassium: 3.8 mEq/L (ref 3.5–5.3)
Sodium: 138 mEq/L (ref 135–145)
Total Bilirubin: 0.5 mg/dL (ref 0.2–1.2)
Total Protein: 6.5 g/dL (ref 6.0–8.3)

## 2014-02-28 LAB — FERRITIN CHCC: Ferritin: 7 ng/ml — ABNORMAL LOW (ref 22–316)

## 2014-02-28 NOTE — Progress Notes (Signed)
Hematology and Oncology Follow Up Visit  Chase Gutierrez [id number] Oct 07, 1950 63 y.o. 61/09/2013   Principle Diagnosis:  Polycythemia vera - JAK2 NEGATIVE  Current Therapy:    Phlebotomy to maintain hematocrit below 40%  Aspirin 81 mg by mouth daily     Interim History:  Chase Gutierrez is back for followup. He's doing fairly well. He has been seeing all of his doctors. He does have his group of doctors that he does see.  He was in a bridge tournament. He and his team actually finished first.  He's had no sweats. He's doing his CPAP for his sleep apnea.  His final watch what he eats.  He is to go back onto testosterone replacement therapy. This makes him feel better. I don't see any problems with this. We are phlebotomizing him. He is on aspirin. As such, his quality of life will be better with the androgen. Medications: Current outpatient prescriptions:ALPRAZolam (XANAX) 0.5 MG tablet, TAKE TWO TABLETS TWICE DAILY AS NEEDED, Disp: 60 tablet, Rfl: 2;  amitriptyline (ELAVIL) 25 MG tablet, Take 25 mg by mouth at bedtime. , Disp: , Rfl: ;  aspirin 81 MG EC tablet, Take 81 mg by mouth daily., Disp: , Rfl: ;  COLCRYS 0.6 MG tablet, Take 0.6 mg by mouth as needed. , Disp: , Rfl:  furosemide (LASIX) 40 MG tablet, Take 1 tablet (40 mg total) by mouth every 3 (three) days., Disp: 30 tablet, Rfl: ;  hydrochlorothiazide (HYDRODIURIL) 12.5 MG tablet, Take 12.5 mg by mouth daily.  , Disp: , Rfl: ;  ibuprofen (ADVIL,MOTRIN) 400 MG tablet, Take 400 mg by mouth as needed.  , Disp: , Rfl: ;  levothyroxine (SYNTHROID) 25 MCG tablet, Take 25 mcg by mouth daily.  , Disp: , Rfl:  lisinopril (PRINIVIL,ZESTRIL) 20 MG tablet, Take 10 mg by mouth daily. , Disp: , Rfl: ;  Multiple Vitamin (MULTIVITAMIN) capsule, Take 1 capsule by mouth daily.  , Disp: , Rfl: ;  naproxen (NAPROSYN) 500 MG tablet, Take 250 mg by mouth as needed. , Disp: , Rfl: ;  NUVIGIL 250 MG tablet, Take 125 mg by mouth as needed. Now taking 1/2  tab., Disp: , Rfl: ;  potassium chloride SA (K-DUR,KLOR-CON) 20 MEQ tablet, Take 20 mEq by mouth once. , Disp: , Rfl:  ANDROGEL PUMP 20.25 MG/ACT (1.62%) GEL, ON HOLD UNTIL PT REVIEWS WITH DR. Marin Olp 02-28-14, Disp: , Rfl:   Allergies: No Known Allergies  Past Medical History, Surgical history, Social history, and Family History were reviewed and updated.  Review of Systems: As above  Physical Exam:  height is 5\' 8"  (1.727 m) and weight is 249 lb (112.946 kg). His oral temperature is 97.7 F (36.5 C). His blood pressure is 99/61 and his pulse is 83. His respiration is 18.   Obese white gentleman. Lungs are clear. Cardiac exam regular in rhythm. He has a 1/6 systolic ejection murmur. Abdomen soft. Has good bowel sounds. There is no fluid wave. There is no palpable liver or spleen tip. Back exam no tenderness over the spine ribs or hips. Extremities shows no clubbing cyanosis or edema. Neurological exam shows no focal deficits. Skin exam no rashes.  Lab Results  Component Value Date   WBC 7.3 02/28/2014   HGB 10.8* 02/28/2014   HCT 35.5* 02/28/2014   MCV 69* 02/28/2014   PLT 233 02/28/2014     Chemistry      Component Value Date/Time   NA 138 02/25/2014 1703   NA  145 01/17/2014 0910   K 3.7 02/25/2014 1703   K 4.7 01/17/2014 0910   CL 101 02/25/2014 1703   CL 102 01/17/2014 0910   CO2 28 02/25/2014 1703   CO2 31 01/17/2014 0910   BUN 30* 02/25/2014 1703   BUN 21 01/17/2014 0910   CREATININE 1.6* 02/25/2014 1703   CREATININE 1.2 01/17/2014 0910      Component Value Date/Time   CALCIUM 9.5 02/25/2014 1703   CALCIUM 8.4 01/17/2014 0910   ALKPHOS 98* 01/17/2014 0910   ALKPHOS 95 12/17/2013 0807   AST 19 01/17/2014 0910   AST 20 12/17/2013 0807   ALT 23 01/17/2014 0910   ALT 22 12/17/2013 0807   BILITOT 0.50 01/17/2014 0910   BILITOT 0.3 12/17/2013 0807         Impression and Plan: Chase Gutierrez is 63 year old white gentleman with polycythemia. He does not be be phlebotomized today.  We will plan  to get him back in one month. He probably will need to be phlebotomized at that time.   Volanda Napoleon, MD 7/2/20159:18 AM

## 2014-02-28 NOTE — Progress Notes (Signed)
No phlebotomy today d/t hct > 40. dph

## 2014-02-28 NOTE — Telephone Encounter (Signed)
Pt scheduled 8-3 due to him being out of town

## 2014-03-04 ENCOUNTER — Ambulatory Visit (HOSPITAL_COMMUNITY): Payer: 59 | Attending: Cardiovascular Disease | Admitting: Cardiology

## 2014-03-04 ENCOUNTER — Other Ambulatory Visit (HOSPITAL_COMMUNITY): Payer: Self-pay | Admitting: Internal Medicine

## 2014-03-04 DIAGNOSIS — I509 Heart failure, unspecified: Secondary | ICD-10-CM | POA: Insufficient documentation

## 2014-03-04 DIAGNOSIS — I359 Nonrheumatic aortic valve disorder, unspecified: Secondary | ICD-10-CM

## 2014-03-04 DIAGNOSIS — E669 Obesity, unspecified: Secondary | ICD-10-CM | POA: Insufficient documentation

## 2014-03-04 DIAGNOSIS — I079 Rheumatic tricuspid valve disease, unspecified: Secondary | ICD-10-CM | POA: Insufficient documentation

## 2014-03-04 DIAGNOSIS — I10 Essential (primary) hypertension: Secondary | ICD-10-CM | POA: Insufficient documentation

## 2014-03-04 DIAGNOSIS — Z954 Presence of other heart-valve replacement: Secondary | ICD-10-CM | POA: Insufficient documentation

## 2014-03-04 DIAGNOSIS — E785 Hyperlipidemia, unspecified: Secondary | ICD-10-CM | POA: Insufficient documentation

## 2014-03-04 NOTE — Progress Notes (Signed)
Echo performed. 

## 2014-03-11 ENCOUNTER — Ambulatory Visit: Payer: 59 | Admitting: Internal Medicine

## 2014-04-01 ENCOUNTER — Ambulatory Visit (HOSPITAL_BASED_OUTPATIENT_CLINIC_OR_DEPARTMENT_OTHER): Payer: 59 | Admitting: Hematology & Oncology

## 2014-04-01 ENCOUNTER — Encounter: Payer: Self-pay | Admitting: Hematology & Oncology

## 2014-04-01 ENCOUNTER — Other Ambulatory Visit (HOSPITAL_BASED_OUTPATIENT_CLINIC_OR_DEPARTMENT_OTHER): Payer: 59 | Admitting: Lab

## 2014-04-01 ENCOUNTER — Ambulatory Visit (HOSPITAL_BASED_OUTPATIENT_CLINIC_OR_DEPARTMENT_OTHER): Payer: 59

## 2014-04-01 VITALS — BP 126/79 | HR 83 | Resp 18

## 2014-04-01 VITALS — BP 111/80 | HR 81 | Temp 97.3°F | Resp 18 | Ht 68.0 in | Wt 252.0 lb

## 2014-04-01 DIAGNOSIS — D45 Polycythemia vera: Secondary | ICD-10-CM

## 2014-04-01 LAB — IRON AND TIBC CHCC
%SAT: 5 % — AB (ref 20–55)
IRON: 23 ug/dL — AB (ref 42–163)
TIBC: 444 ug/dL — ABNORMAL HIGH (ref 202–409)
UIBC: 421 ug/dL — AB (ref 117–376)

## 2014-04-01 LAB — CBC WITH DIFFERENTIAL (CANCER CENTER ONLY)
BASO#: 0.2 10*3/uL (ref 0.0–0.2)
BASO%: 2.8 % — AB (ref 0.0–2.0)
EOS ABS: 0.4 10*3/uL (ref 0.0–0.5)
EOS%: 5.2 % (ref 0.0–7.0)
HCT: 39.5 % (ref 38.7–49.9)
HGB: 12.1 g/dL — ABNORMAL LOW (ref 13.0–17.1)
LYMPH#: 1.4 10*3/uL (ref 0.9–3.3)
LYMPH%: 18.9 % (ref 14.0–48.0)
MCH: 20.5 pg — ABNORMAL LOW (ref 28.0–33.4)
MCHC: 30.6 g/dL — ABNORMAL LOW (ref 32.0–35.9)
MCV: 67 fL — ABNORMAL LOW (ref 82–98)
MONO#: 0.6 10*3/uL (ref 0.1–0.9)
MONO%: 7.9 % (ref 0.0–13.0)
NEUT#: 4.9 10*3/uL (ref 1.5–6.5)
NEUT%: 65.2 % (ref 40.0–80.0)
Platelets: 276 10*3/uL (ref 145–400)
RBC: 5.89 10*6/uL — ABNORMAL HIGH (ref 4.20–5.70)
RDW: 19.3 % — AB (ref 11.1–15.7)
WBC: 7.5 10*3/uL (ref 4.0–10.0)

## 2014-04-01 LAB — CMP (CANCER CENTER ONLY)
ALK PHOS: 84 U/L (ref 26–84)
ALT(SGPT): 27 U/L (ref 10–47)
AST: 24 U/L (ref 11–38)
Albumin: 3.9 g/dL (ref 3.3–5.5)
BUN: 19 mg/dL (ref 7–22)
CO2: 31 mEq/L (ref 18–33)
Calcium: 8.7 mg/dL (ref 8.0–10.3)
Chloride: 97 mEq/L — ABNORMAL LOW (ref 98–108)
Creat: 1.1 mg/dl (ref 0.6–1.2)
GLUCOSE: 137 mg/dL — AB (ref 73–118)
Potassium: 4 mEq/L (ref 3.3–4.7)
Sodium: 144 mEq/L (ref 128–145)
Total Bilirubin: 0.5 mg/dl (ref 0.20–1.60)
Total Protein: 7.4 g/dL (ref 6.4–8.1)

## 2014-04-01 LAB — FERRITIN CHCC: Ferritin: 7 ng/ml — ABNORMAL LOW (ref 22–316)

## 2014-04-01 LAB — CHCC SATELLITE - SMEAR

## 2014-04-01 LAB — TECHNOLOGIST REVIEW CHCC SATELLITE

## 2014-04-01 NOTE — Progress Notes (Signed)
Hematology and Oncology Follow Up Visit  JOVONNI BORQUEZ [id number] June 29, 1951 63 y.o. 04/01/2014   Principle Diagnosis:  Polycythemia vera - JAK2 NEGATIVE  Current Therapy:    Phlebotomy to maintain hematocrit below 40%  Aspirin 81 mg by mouth daily     Interim History:  Mr.  Alvizo is back for followup. Is feeling better. The phlebotomies deathly helped him. Apparently just got back from [place]. He and his wife at the time of there.  He's doing his CPAP. This also is improving his quality of life. That overall, he feels more alert. He feels as if he has more control over his life.  He's had no problems with fever. He's had no rashes. He's had no change in bowel or bladder habits. He said he had a echocardiogram recently. His ejection fraction was 65-70%. His prosthetic aortic valve that  had a minimal regurgitation. He had a mildly dilated descending aorta.     Medications: Current outpatient prescriptions:ALPRAZolam (XANAX) 0.5 MG tablet, TAKE TWO TABLETS TWICE DAILY AS NEEDED, Disp: 60 tablet, Rfl: 2;  amitriptyline (ELAVIL) 25 MG tablet, Take 25 mg by mouth at bedtime. , Disp: , Rfl: ;  ANDROGEL PUMP 20.25 MG/ACT (1.62%) GEL, ON HOLD UNTIL PT REVIEWS WITH DR. Marin Olp 02-28-14, Disp: , Rfl: ;  aspirin 81 MG EC tablet, Take 81 mg by mouth daily., Disp: , Rfl:  COLCRYS 0.6 MG tablet, Take 0.6 mg by mouth as needed. , Disp: , Rfl: ;  furosemide (LASIX) 40 MG tablet, Take 1 tablet (40 mg total) by mouth every 3 (three) days., Disp: 30 tablet, Rfl: ;  hydrochlorothiazide (HYDRODIURIL) 12.5 MG tablet, Take 12.5 mg by mouth daily.  , Disp: , Rfl: ;  ibuprofen (ADVIL,MOTRIN) 400 MG tablet, Take 400 mg by mouth as needed.  , Disp: , Rfl:  levothyroxine (SYNTHROID) 25 MCG tablet, Take 25 mcg by mouth daily.  , Disp: , Rfl: ;  lisinopril (PRINIVIL,ZESTRIL) 20 MG tablet, Take 10 mg by mouth daily. , Disp: , Rfl: ;  Multiple Vitamin (MULTIVITAMIN) capsule, Take 1 capsule by mouth daily.  ,  Disp: , Rfl: ;  naproxen (NAPROSYN) 500 MG tablet, Take 250 mg by mouth as needed. , Disp: , Rfl: ;  NUVIGIL 250 MG tablet, Take 125 mg by mouth as needed. Now taking 1/2 tab., Disp: , Rfl:  potassium chloride SA (K-DUR,KLOR-CON) 20 MEQ tablet, Take 20 mEq by mouth once. , Disp: , Rfl:   Allergies: No Known Allergies  Past Medical History, Surgical history, Social history, and Family History were reviewed and updated.  Review of Systems: As above  Physical Exam:  height is 5\' 8"  (1.727 m) and weight is 252 lb (114.306 kg). His oral temperature is 97.3 F (36.3 C). His blood pressure is 111/80 and his pulse is 81. His respiration is 18.   Mildly obese white gentleman. Head and neck exam shows no ocular or oral lesions. He has no palpable cervical or supraclavicular lymph nodes. Lungs are clear. Cardiac exam regular rate and rhythm with no murmurs rubs or bruits. I really cannot detect any click from the prosthetic valve. Abdomen is soft. He is mildly obese. He has no fluid wave. There is no palpable liver or spleen tip. Back exam no tenderness over the spine ribs or hips. Extremities shows no clubbing cyanosis or edema. As directed of his joints. Skin exam shows no rashes, ecchymosis or petechia. Neurological exam shows no focal neurological deficits.  Lab Results  Component Value Date   WBC 7.5 04/01/2014   HGB 12.1* 04/01/2014   HCT 39.5 04/01/2014   MCV 67* 04/01/2014   PLT 276 04/01/2014     Chemistry      Component Value Date/Time   NA 138 02/28/2014 0830   NA 145 01/17/2014 0910   K 3.8 02/28/2014 0830   K 4.7 01/17/2014 0910   CL 98 02/28/2014 0830   CL 102 01/17/2014 0910   CO2 29 02/28/2014 0830   CO2 31 01/17/2014 0910   BUN 30* 02/28/2014 0830   BUN 21 01/17/2014 0910   CREATININE 1.37* 02/28/2014 0830   CREATININE 1.2 01/17/2014 0910      Component Value Date/Time   CALCIUM 9.2 02/28/2014 0830   CALCIUM 8.4 01/17/2014 0910   ALKPHOS 81 02/28/2014 0830   ALKPHOS 98* 01/17/2014 0910   AST 21  02/28/2014 0830   AST 19 01/17/2014 0910   ALT 21 02/28/2014 0830   ALT 23 01/17/2014 0910   BILITOT 0.5 02/28/2014 0830   BILITOT 0.50 01/17/2014 0910         Impression and Plan: Mr. Quast is 63 year old gentleman with polycythemia. We'll phlebotomize him and he feels well.  We will go and phlebotomizing today since his hematocrit is almost 40%.  We'll plan to get him back in 6 weeks.  Of note, he has a bridge treatment in late October. We will adjust his schedule so that we get him back before his tournament so that he will be mentally alert and ready to play in the competition.  Volanda Napoleon, MD 8/3/201512:13 PM

## 2014-04-01 NOTE — Patient Instructions (Signed)

## 2014-04-01 NOTE — Progress Notes (Signed)
Chase Gutierrez presents today for phlebotomy per MD orders. Phlebotomy procedure started at 1210 and ended at 1215. 500 ml  removed. Patient observed for 30 minutes after procedure without any incident. Patient tolerated procedure well. IV needle removed intact.

## 2014-05-20 ENCOUNTER — Ambulatory Visit: Payer: 59 | Admitting: Hematology & Oncology

## 2014-05-20 ENCOUNTER — Other Ambulatory Visit: Payer: 59 | Admitting: Lab

## 2014-05-22 ENCOUNTER — Ambulatory Visit (HOSPITAL_BASED_OUTPATIENT_CLINIC_OR_DEPARTMENT_OTHER): Payer: 59 | Admitting: Family

## 2014-05-22 ENCOUNTER — Other Ambulatory Visit (HOSPITAL_BASED_OUTPATIENT_CLINIC_OR_DEPARTMENT_OTHER): Payer: 59 | Admitting: Lab

## 2014-05-22 ENCOUNTER — Ambulatory Visit: Payer: 59

## 2014-05-22 VITALS — BP 125/83 | HR 82 | Temp 97.7°F | Resp 18 | Ht 68.0 in | Wt 262.0 lb

## 2014-05-22 DIAGNOSIS — D45 Polycythemia vera: Secondary | ICD-10-CM

## 2014-05-22 LAB — CMP (CANCER CENTER ONLY)
ALT(SGPT): 28 U/L (ref 10–47)
AST: 22 U/L (ref 11–38)
Albumin: 3.7 g/dL (ref 3.3–5.5)
Alkaline Phosphatase: 94 U/L — ABNORMAL HIGH (ref 26–84)
BUN: 18 mg/dL (ref 7–22)
CO2: 29 meq/L (ref 18–33)
CREATININE: 1.1 mg/dL (ref 0.6–1.2)
Calcium: 9 mg/dL (ref 8.0–10.3)
Chloride: 95 mEq/L — ABNORMAL LOW (ref 98–108)
Glucose, Bld: 198 mg/dL — ABNORMAL HIGH (ref 73–118)
Potassium: 3.6 mEq/L (ref 3.3–4.7)
Sodium: 139 mEq/L (ref 128–145)
Total Bilirubin: 0.6 mg/dl (ref 0.20–1.60)
Total Protein: 7 g/dL (ref 6.4–8.1)

## 2014-05-22 LAB — CBC WITH DIFFERENTIAL (CANCER CENTER ONLY)
BASO#: 0.2 10*3/uL (ref 0.0–0.2)
BASO%: 3 % — ABNORMAL HIGH (ref 0.0–2.0)
EOS%: 5.8 % (ref 0.0–7.0)
Eosinophils Absolute: 0.5 10*3/uL (ref 0.0–0.5)
HCT: 38.1 % — ABNORMAL LOW (ref 38.7–49.9)
HGB: 11.7 g/dL — ABNORMAL LOW (ref 13.0–17.1)
LYMPH#: 2.2 10*3/uL (ref 0.9–3.3)
LYMPH%: 27.4 % (ref 14.0–48.0)
MCH: 20.5 pg — AB (ref 28.0–33.4)
MCHC: 30.7 g/dL — AB (ref 32.0–35.9)
MCV: 67 fL — ABNORMAL LOW (ref 82–98)
MONO#: 0.6 10*3/uL (ref 0.1–0.9)
MONO%: 7.6 % (ref 0.0–13.0)
NEUT%: 56.2 % (ref 40.0–80.0)
NEUTROS ABS: 4.4 10*3/uL (ref 1.5–6.5)
PLATELETS: 306 10*3/uL (ref 145–400)
RBC: 5.71 10*6/uL — ABNORMAL HIGH (ref 4.20–5.70)
RDW: 19.9 % — ABNORMAL HIGH (ref 11.1–15.7)
WBC: 7.9 10*3/uL (ref 4.0–10.0)

## 2014-05-22 LAB — CHCC SATELLITE - SMEAR

## 2014-05-22 LAB — IRON AND TIBC CHCC
%SAT: 6 % — ABNORMAL LOW (ref 20–55)
IRON: 24 ug/dL — AB (ref 42–163)
TIBC: 433 ug/dL — AB (ref 202–409)
UIBC: 409 ug/dL — AB (ref 117–376)

## 2014-05-22 LAB — FERRITIN CHCC: Ferritin: 10 ng/ml — ABNORMAL LOW (ref 22–316)

## 2014-05-22 NOTE — Progress Notes (Signed)
[facility]  [phone]) [phone] [phone]) [phone]  ID: Chase Gutierrez OB: 1951/04/20 MR#: [id number] Patient Care Team: Marton Redwood, MD as PCP - General (Internal Medicine) Fay Records, MD (Cardiology)  DIAGNOSIS: Polycythemia vera - JAK2 NEGATIVE  INTERVAL HISTORY: Chase Gutierrez is back today for follow-up. He is doing very well. He just returned from [facility] with his wife. He is feeling a little tired and has a sinus headache. He has gained 10 lbs since his last visit and has some mild SOB with exertion. He feels that this is because he hasn't been taking his lasix and plans to start back today. He was last phlebotomized in August and did well with it. His Hct today is 38.1. He is still using his CPAP and it is helping. He denies fever, chills, n/v, cough, rash. dizziness, chest pain, palpitations, abdominal pain, constipation, diarrhea, blood in urine or stool. He has had no swelling, tenderness, numbness or tingling in his extremities. His appetite is good and he is drinking plenty of fluids.  CURRENT TREATMENT: Phlebotomy to maintain hematocrit below 40%  Aspirin 81 mg by mouth daily  REVIEW OF SYSTEMS: All other 10 point review of systems is negative.   PAST MEDICAL HISTORY: Past Medical History  Diagnosis Date  . Depressed   . Allergic rhinitis   . Nephrolithiasis   . Dyslipidemia   . Aortic stenosis   . Hypertension   . OSA (obstructive sleep apnea)   . Sinus complaint   . Obesity   . Polycythemia vera(238.4) 06/16/2012  . Adenomatous colon polyp     2005   PAST SURGICAL HISTORY: Past Surgical History  Procedure Laterality Date  . Tonsillectomy  1982, 1993  . Aortic valve replacement  10/15/2008  . Hemorrhoid surgery  1990   FAMILY HISTORY Family History  Problem Relation Age of Onset  . Coronary artery disease Mother   . Diabetes Father   . Coronary artery disease Father   . Lung cancer Father    GYNECOLOGIC HISTORY:   No LMP for male patient.   SOCIAL HISTORY:  History   Social History  . Marital Status: Divorced    Spouse Name: N/A    Number of Children: 1  . Years of Education: N/A   Occupational History  . attorney Unemployed   Social History Main Topics  . Smoking status: Never Smoker   . Smokeless tobacco: Never Used     Comment: never used  tobacco  . Alcohol Use: Yes     Comment: rare  . Drug Use: Not on file  . Sexual Activity: Not on file   Other Topics Concern  . Not on file   Social History Narrative   Lives with daughter.   ADVANCED DIRECTIVES: <no information>  HEALTH MAINTENANCE: History  Substance Use Topics  . Smoking status: Never Smoker   . Smokeless tobacco: Never Used     Comment: never used  tobacco  . Alcohol Use: Yes     Comment: rare   Colonoscopy: PAP: Bone density: Lipid panel:  No Known Allergies  Current Outpatient Prescriptions  Medication Sig Dispense Refill  . ALPRAZolam (XANAX) 0.5 MG tablet TAKE TWO TABLETS TWICE DAILY AS NEEDED  60 tablet  2  . amitriptyline (ELAVIL) 25 MG tablet Take 25 mg by mouth at bedtime.       . ANDROGEL PUMP 20.25 MG/ACT (1.62%) GEL ON HOLD UNTIL PT REVIEWS WITH DR. Marin Olp 02-28-14      .  aspirin 81 MG EC tablet Take 81 mg by mouth daily.      Marland Kitchen COLCRYS 0.6 MG tablet Take 0.6 mg by mouth as needed.       . furosemide (LASIX) 40 MG tablet Take 1 tablet (40 mg total) by mouth every 3 (three) days.  30 tablet    . hydrochlorothiazide (HYDRODIURIL) 12.5 MG tablet Take 12.5 mg by mouth daily.        Marland Kitchen ibuprofen (ADVIL,MOTRIN) 400 MG tablet Take 400 mg by mouth as needed.        Marland Kitchen levothyroxine (SYNTHROID) 25 MCG tablet Take 25 mcg by mouth daily.        Marland Kitchen lisinopril (PRINIVIL,ZESTRIL) 20 MG tablet Take 10 mg by mouth daily.       . Multiple Vitamin (MULTIVITAMIN) capsule Take 1 capsule by mouth daily.        . naproxen (NAPROSYN) 500 MG tablet Take 250 mg by mouth as needed.       Marland Kitchen NUVIGIL 250 MG tablet Take 125 mg  by mouth as needed. Now taking 1/2 tab.      . potassium chloride SA (K-DUR,KLOR-CON) 20 MEQ tablet Take 20 mEq by mouth once.        No current facility-administered medications for this visit.   OBJECTIVE: Filed Vitals:   05/22/14 0921  BP: 125/83  Pulse: 82  Temp: 97.7 F (36.5 C)  Resp: 18   Body mass index is 39.85 kg/(m^2). ECOG FS:0 - Asymptomatic Ocular: Sclerae unicteric, pupils equal, round and reactive to light Ear-nose-throat: Oropharynx clear, dentition fair Lymphatic: No cervical or supraclavicular adenopathy Lungs no rales or rhonchi, good excursion bilaterally Heart regular rate and rhythm, no murmur appreciated Abd soft, nontender, positive bowel sounds MSK no focal spinal tenderness, no joint edema Neuro: non-focal, well-oriented, appropriate affect  LAB RESULTS: CMP     Component Value Date/Time   NA 139 05/22/2014 0834   NA 138 02/28/2014 0830   K 3.6 05/22/2014 0834   K 3.8 02/28/2014 0830   CL 95* 05/22/2014 0834   CL 98 02/28/2014 0830   CO2 29 05/22/2014 0834   CO2 29 02/28/2014 0830   GLUCOSE 198* 05/22/2014 0834   GLUCOSE 146* 02/28/2014 0830   BUN 18 05/22/2014 0834   BUN 30* 02/28/2014 0830   CREATININE 1.1 05/22/2014 0834   CREATININE 1.37* 02/28/2014 0830   CALCIUM 9.0 05/22/2014 0834   CALCIUM 9.2 02/28/2014 0830   PROT 7.0 05/22/2014 0834   PROT 6.5 02/28/2014 0830   ALBUMIN 4.3 02/28/2014 0830   AST 22 05/22/2014 0834   AST 21 02/28/2014 0830   ALT 28 05/22/2014 0834   ALT 21 02/28/2014 0830   ALKPHOS 94* 05/22/2014 0834   ALKPHOS 81 02/28/2014 0830   BILITOT 0.60 05/22/2014 0834   BILITOT 0.5 02/28/2014 0830   GFRNONAA 69.61 05/15/2010 1001   GFRAA  Value: >60        The eGFR has been calculated using the MDRD equation. This calculation has not been validated in all clinical situations. eGFR's persistently <60 mL/min signify possible Chronic Kidney Disease. 04/24/2010 0945   No results found for this basename: SPEP, UPEP,  kappa and lambda light chains   Lab Results   Component Value Date   WBC 7.9 05/22/2014   NEUTROABS 4.4 05/22/2014   HGB 11.7* 05/22/2014   HCT 38.1* 05/22/2014   MCV 67* 05/22/2014   PLT 306 05/22/2014   No results found for this basename: LABCA2  No components found with this basename: SLPNP005   No results found for this basename: INR,  in the last 168 hours  STUDIES: No results found.  ASSESSMENT/PLAN: Mr. Olazabal is 63 year old gentleman with polycythemia. He was last phlebotomized in August. His Hct today is 38.1. We will not phlebotomize him today He is going to start taking his lasix again to help with the fluid weight gain and SOB.   We'll plan to get him back in 6 weeks.  Will see him back in 3 weeks for follow-up and labs before his bridge tournament.  He knows to call here with any questions or concerns and to go to the ED in the event of an emergency. We can certainly see him back sooner if need be.   Eliezer Bottom, NP 05/22/2014 10:30 AM

## 2014-05-22 NOTE — Progress Notes (Signed)
No phlebotomy today due to HCT 38. NP stated no phlebotomy today.

## 2014-05-30 ENCOUNTER — Other Ambulatory Visit: Payer: Self-pay | Admitting: Internal Medicine

## 2014-05-30 DIAGNOSIS — R911 Solitary pulmonary nodule: Secondary | ICD-10-CM

## 2014-05-30 DIAGNOSIS — N281 Cyst of kidney, acquired: Secondary | ICD-10-CM

## 2014-06-05 ENCOUNTER — Other Ambulatory Visit: Payer: 59

## 2014-06-05 ENCOUNTER — Inpatient Hospital Stay: Admission: RE | Admit: 2014-06-05 | Payer: 59 | Source: Ambulatory Visit

## 2014-06-07 ENCOUNTER — Ambulatory Visit
Admission: RE | Admit: 2014-06-07 | Discharge: 2014-06-07 | Disposition: A | Discharge status: Home / Self Care | Payer: 59 | Source: Ambulatory Visit | Attending: Internal Medicine | Admitting: Internal Medicine

## 2014-06-07 DIAGNOSIS — N281 Cyst of kidney, acquired: Secondary | ICD-10-CM

## 2014-06-07 DIAGNOSIS — R911 Solitary pulmonary nodule: Secondary | ICD-10-CM

## 2014-06-07 MED ORDER — IOHEXOL 300 MG/ML  SOLN
125.0000 mL | Freq: Once | INTRAMUSCULAR | Status: AC | PRN
  Administered 2014-06-07: 125 mL via INTRAVENOUS

## 2014-06-13 ENCOUNTER — Other Ambulatory Visit (HOSPITAL_BASED_OUTPATIENT_CLINIC_OR_DEPARTMENT_OTHER): Payer: 59 | Admitting: Lab

## 2014-06-13 ENCOUNTER — Encounter: Payer: Self-pay | Admitting: Hematology & Oncology

## 2014-06-13 ENCOUNTER — Ambulatory Visit: Payer: 59

## 2014-06-13 ENCOUNTER — Ambulatory Visit (HOSPITAL_BASED_OUTPATIENT_CLINIC_OR_DEPARTMENT_OTHER): Payer: 59 | Admitting: Hematology & Oncology

## 2014-06-13 VITALS — BP 139/87 | HR 77 | Temp 97.5°F | Resp 18 | Ht 68.0 in | Wt 262.0 lb

## 2014-06-13 DIAGNOSIS — D45 Polycythemia vera: Secondary | ICD-10-CM

## 2014-06-13 LAB — CMP (CANCER CENTER ONLY)
ALT: 35 U/L (ref 10–47)
AST: 26 U/L (ref 11–38)
Albumin: 3.5 g/dL (ref 3.3–5.5)
Alkaline Phosphatase: 108 U/L — ABNORMAL HIGH (ref 26–84)
BILIRUBIN TOTAL: 0.5 mg/dL (ref 0.20–1.60)
BUN, Bld: 18 mg/dL (ref 7–22)
CHLORIDE: 104 meq/L (ref 98–108)
CO2: 27 meq/L (ref 18–33)
Calcium: 8.9 mg/dL (ref 8.0–10.3)
Creat: 1.1 mg/dl (ref 0.6–1.2)
GLUCOSE: 222 mg/dL — AB (ref 73–118)
Potassium: 4.4 mEq/L (ref 3.3–4.7)
SODIUM: 140 meq/L (ref 128–145)
Total Protein: 6.6 g/dL (ref 6.4–8.1)

## 2014-06-13 LAB — CBC WITH DIFFERENTIAL (CANCER CENTER ONLY)
BASO#: 0.2 10*3/uL (ref 0.0–0.2)
BASO%: 3.1 % — ABNORMAL HIGH (ref 0.0–2.0)
EOS ABS: 0.5 10*3/uL (ref 0.0–0.5)
EOS%: 6 % (ref 0.0–7.0)
HCT: 38.2 % — ABNORMAL LOW (ref 38.7–49.9)
HGB: 11.6 g/dL — ABNORMAL LOW (ref 13.0–17.1)
LYMPH#: 2 10*3/uL (ref 0.9–3.3)
LYMPH%: 25.6 % (ref 14.0–48.0)
MCH: 20.5 pg — ABNORMAL LOW (ref 28.0–33.4)
MCHC: 30.4 g/dL — ABNORMAL LOW (ref 32.0–35.9)
MCV: 68 fL — AB (ref 82–98)
MONO#: 0.6 10*3/uL (ref 0.1–0.9)
MONO%: 7.2 % (ref 0.0–13.0)
NEUT#: 4.5 10*3/uL (ref 1.5–6.5)
NEUT%: 58.1 % (ref 40.0–80.0)
Platelets: 289 10*3/uL (ref 145–400)
RBC: 5.65 10*6/uL (ref 4.20–5.70)
RDW: 20 % — AB (ref 11.1–15.7)
WBC: 7.8 10*3/uL (ref 4.0–10.0)

## 2014-06-13 NOTE — Progress Notes (Signed)
Hematology and Oncology Follow Up Visit  Chase Gutierrez [id number] 06/23/1951 63 y.o. 06/13/2014   Principle Diagnosis:  Polycythemia vera - JAK2 NEGATIVE  Current Therapy:    Phlebotomy to maintain hematocrit below 40%  Aspirin 81 mg by mouth daily     Interim History:  Mr.  Gutierrez is back for followup. He is doing pretty well. I just saw him this past Saturday at a function. He was enjoying himself.  He is going to bridge tournaments. His wife is down in [place]. He will be headed down there soon.  He did have a CT scan done recently. This is for the history of kidney lesions. The CT scan really did not show anything with his kidneys. He has multiple small pulmonary nodules that are stable. However, there is noted to be a new left lower lobe nodule that is 4 mm. This needs to be followed up in about one year.  We've not had to phlebotomize him for several months. He is iron deficient which is helping.  He's doing his CPAP. This also is improving his quality of life. That overall, he feels more alert. He feels as if he has more control over his life. The CPAP clearly is helping with his polycythemia as he is in a better oxygen state.  He's had no problems with fever. He's had no rashes. He's had no change in bowel or bladder habits.     Medications: Current outpatient prescriptions:ALPRAZolam (XANAX) 0.5 MG tablet, TAKE TWO TABLETS TWICE DAILY AS NEEDED, Disp: 60 tablet, Rfl: 2;  amitriptyline (ELAVIL) 25 MG tablet, Take 25 mg by mouth at bedtime. , Disp: , Rfl: ;  ANDROGEL PUMP 20.25 MG/ACT (1.62%) GEL, ON HOLD UNTIL PT REVIEWS WITH DR. Marin Olp 02-28-14, Disp: , Rfl: ;  aspirin 81 MG EC tablet, Take 81 mg by mouth daily., Disp: , Rfl:  COLCRYS 0.6 MG tablet, Take 0.6 mg by mouth as needed. , Disp: , Rfl: ;  furosemide (LASIX) 40 MG tablet, Take 1 tablet (40 mg total) by mouth every 3 (three) days., Disp: 30 tablet, Rfl: ;  hydrochlorothiazide (HYDRODIURIL) 12.5 MG tablet, Take  12.5 mg by mouth daily.  , Disp: , Rfl: ;  ibuprofen (ADVIL,MOTRIN) 400 MG tablet, Take 400 mg by mouth as needed.  , Disp: , Rfl:  levothyroxine (SYNTHROID) 25 MCG tablet, Take 25 mcg by mouth daily.  , Disp: , Rfl: ;  lisinopril (PRINIVIL,ZESTRIL) 20 MG tablet, Take 10 mg by mouth daily. , Disp: , Rfl: ;  Multiple Vitamin (MULTIVITAMIN) capsule, Take 1 capsule by mouth daily.  , Disp: , Rfl: ;  naproxen (NAPROSYN) 500 MG tablet, Take 250 mg by mouth as needed. , Disp: , Rfl: ;  NUVIGIL 250 MG tablet, Take 125 mg by mouth as needed. Now taking 1/2 tab., Disp: , Rfl:  potassium chloride SA (K-DUR,KLOR-CON) 20 MEQ tablet, Take 20 mEq by mouth once. , Disp: , Rfl:   Allergies: No Known Allergies  Past Medical History, Surgical history, Social history, and Family History were reviewed and updated.  Review of Systems: As above  Physical Exam:  height is 5\' 8"  (1.727 m) and weight is 262 lb (118.842 kg). His oral temperature is 97.5 F (36.4 C). His blood pressure is 139/87 and his pulse is 77. His respiration is 18.   Mildly obese white gentleman. Head and neck exam shows no ocular or oral lesions. He has no palpable cervical or supraclavicular lymph nodes. Lungs are clear. Cardiac  exam regular rate and rhythm with no murmurs rubs or bruits. I really cannot detect any click from the prosthetic valve. Abdomen is soft. He is mildly obese. He has no fluid wave. There is no palpable liver or spleen tip. Back exam no tenderness over the spine ribs or hips. Extremities shows no clubbing cyanosis or edema. As directed of his joints. Skin exam shows no rashes, ecchymosis or petechia. Neurological exam shows no focal neurological deficits.  Lab Results  Component Value Date   WBC 7.8 06/13/2014   HGB 11.6* 06/13/2014   HCT 38.2* 06/13/2014   MCV 68* 06/13/2014   PLT 289 06/13/2014     Chemistry      Component Value Date/Time   NA 140 06/13/2014 0838   NA 138 02/28/2014 0830   K 4.4 06/13/2014 0838    K 3.8 02/28/2014 0830   CL 104 06/13/2014 0838   CL 98 02/28/2014 0830   CO2 27 06/13/2014 0838   CO2 29 02/28/2014 0830   BUN 18 06/13/2014 0838   BUN 30* 02/28/2014 0830   CREATININE 1.1 06/13/2014 0838   CREATININE 1.37* 02/28/2014 0830      Component Value Date/Time   CALCIUM 8.9 06/13/2014 0838   CALCIUM 9.2 02/28/2014 0830   ALKPHOS 108* 06/13/2014 0838   ALKPHOS 81 02/28/2014 0830   AST 26 06/13/2014 0838   AST 21 02/28/2014 0830   ALT 35 06/13/2014 0838   ALT 21 02/28/2014 0830   BILITOT 0.50 06/13/2014 0838   BILITOT 0.5 02/28/2014 0830         Impression and Plan: Chase Gutierrez is 63 year old gentleman with polycythemia. He does not need to be phlebotomized today. He will continue to take his aspirin.  We'll plan to get him back in 6 weeks. He will be going down to [place]. We will see him when he gets back.  Of note, he has a bridge treatment in late October. Chase Napoleon, MD 10/15/20156:38 PM

## 2014-06-14 ENCOUNTER — Telehealth: Payer: Self-pay | Admitting: Hematology & Oncology

## 2014-06-14 NOTE — Telephone Encounter (Signed)
Pt said he would call back to schedule December appointments

## 2014-06-25 ENCOUNTER — Telehealth: Payer: Self-pay | Admitting: Internal Medicine

## 2014-06-25 NOTE — Telephone Encounter (Signed)
Patient is in [place], [place] at a bridge tournament until Friday night. He reported to his wife that on Sat his weight was up 7 pounds. For about a week he's been taking lasix 40 mg daily. Prior to that he'd been taking every other day.  His wife, who is in [place], called to make an appointment with Dr. Harrington Challenger.  He did not report being SOB to her, other than transporting his luggage. He is unavailable on his cell phone until after the tournament is finished each day (around 11 pm)  Appointment scheduled with Dr. Harrington Challenger Monday, Nov 2.

## 2014-06-25 NOTE — Telephone Encounter (Signed)
New Message  Pt wife called states that the pt is out of town. She reports his Aneurysm has gotten larger and is having more heart failure due to taking the lasix. Pt requests an appt this next week and with Dr. Harrington Challenger only. Declined PA and NP. Please call to discuss

## 2014-06-29 NOTE — Progress Notes (Signed)
HPI             Patient is a 63 yo with a history of aortic stenosis (s/p AVR in 2010), HTN, thoracic aneurysm, diastolic CHF, obesity, sleep apnea (uses CPAP) I saw him in June 2015. He comes in today complaining of lack of energy.  Has felt bloated, like he did last summer  Dnies increased salt intake.  No diet change Denies CP   Seen by P Ennever recently  No phlebotomy done   No Known Allergies  Current Outpatient Prescriptions  Medication Sig Dispense Refill  . ALPRAZolam (XANAX) 0.5 MG tablet TAKE TWO TABLETS TWICE DAILY AS NEEDED 60 tablet 2  . amitriptyline (ELAVIL) 25 MG tablet Take 25 mg by mouth at bedtime.     . ANDROGEL PUMP 20.25 MG/ACT (1.62%) GEL ON HOLD UNTIL PT REVIEWS WITH DR. Marin Olp 02-28-14    . aspirin 81 MG EC tablet Take 81 mg by mouth daily.    Marland Kitchen COLCRYS 0.6 MG tablet Take 0.6 mg by mouth as needed.     . furosemide (LASIX) 40 MG tablet Take 1 tablet (40 mg total) by mouth every 3 (three) days. 30 tablet   . hydrochlorothiazide (HYDRODIURIL) 12.5 MG tablet Take 12.5 mg by mouth daily.      Marland Kitchen ibuprofen (ADVIL,MOTRIN) 400 MG tablet Take 400 mg by mouth as needed.      Marland Kitchen levothyroxine (SYNTHROID) 25 MCG tablet Take 25 mcg by mouth daily.      Marland Kitchen lisinopril (PRINIVIL,ZESTRIL) 20 MG tablet Take 10 mg by mouth daily.     . Multiple Vitamin (MULTIVITAMIN) capsule Take 1 capsule by mouth daily.      . naproxen (NAPROSYN) 500 MG tablet Take 250 mg by mouth as needed.     Marland Kitchen NUVIGIL 250 MG tablet Take 125 mg by mouth as needed. Now taking 1/2 tab.    . potassium chloride SA (K-DUR,KLOR-CON) 20 MEQ tablet Take 20 mEq by mouth once.      No current facility-administered medications for this visit.    Past Medical History  Diagnosis Date  . Depressed   . Allergic rhinitis   . Nephrolithiasis   . Dyslipidemia   . Aortic stenosis   . Hypertension   . OSA (obstructive sleep apnea)   . Sinus complaint   . Obesity   . Polycythemia vera(238.4) 06/16/2012  .  Adenomatous colon polyp     2005    Past Surgical History  Procedure Laterality Date  . Tonsillectomy  1982, 1993  . Aortic valve replacement  10/15/2008  . Hemorrhoid surgery  1990    Family History  Problem Relation Age of Onset  . Coronary artery disease Mother   . Diabetes Father   . Coronary artery disease Father   . Lung cancer Father     History   Social History  . Marital Status: Divorced    Spouse Name: N/A    Number of Children: 1  . Years of Education: N/A   Occupational History  . attorney Unemployed   Social History Main Topics  . Smoking status: Never Smoker   . Smokeless tobacco: Never Used     Comment: never used  tobacco  . Alcohol Use: Yes     Comment: rare  . Drug Use: Not on file  . Sexual Activity: Not on file   Other Topics Concern  . Not on file   Social History Narrative   Lives with daughter.    Review  of Systems:  All systems reviewed.  They are negative to the above problem except as previously stated.  Vital Signs: BP 134/104 mmHg  Pulse 80  Ht 5\' 8"  (1.727 m)  Wt 261 lb (118.389 kg)  BMI 39.69 kg/m2  SpO2 98%  Physical Exam Patient is in NAD  HEENT:  Normocephalic, atraumatic. EOMI, PERRLA.  Neck: JVP is normal.  No bruits.  Lungs:Clear to auscultation. No rales no wheezes.  Heart: Regular rate and rhythm. Normal S1, S2. No S3.   No significant murmurs. PMI not displaced.  Abdomen:  Supple, nontender.Mildly distended  Extremities:   Good distal pulses throughout. No lower extremity edema.  Musculoskeletal :moving all extremities.  Neuro:   alert and oriented x3.  CN II-XII grossly intact.  EKG  S$ 87  ST depression inferior and lateral leads Assessment and Plan: 1.  SOB Patient feeling fatigued and bloted  On exam does not appear to be signif volume overloaded.   I would check BMET and BNP  Told him to take 1 1/2 lasix.    2.  AV disease  S/p AVR    3.  HTN Diasatolic BP is up  Patient says that this is high for  him.    4.  Morbid obesity  COunselled on wt loss  He had done well on a low carb diet in past  I would reconsider this now.    5.  Lipids  WIll check today.  COmpare to previous

## 2014-07-01 ENCOUNTER — Encounter: Payer: Self-pay | Admitting: Internal Medicine

## 2014-07-01 ENCOUNTER — Ambulatory Visit (INDEPENDENT_AMBULATORY_CARE_PROVIDER_SITE_OTHER): Payer: 59 | Admitting: Internal Medicine

## 2014-07-01 VITALS — BP 134/104 | HR 80 | Ht 68.0 in | Wt 261.0 lb

## 2014-07-01 DIAGNOSIS — E785 Hyperlipidemia, unspecified: Secondary | ICD-10-CM

## 2014-07-01 DIAGNOSIS — R0602 Shortness of breath: Secondary | ICD-10-CM

## 2014-07-01 DIAGNOSIS — R609 Edema, unspecified: Secondary | ICD-10-CM

## 2014-07-01 LAB — LDL CHOLESTEROL, DIRECT: LDL DIRECT: 101 mg/dL

## 2014-07-01 LAB — LIPID PANEL
CHOLESTEROL: 158 mg/dL (ref 0–200)
HDL: 31.1 mg/dL — ABNORMAL LOW (ref >39.00)
NonHDL: 126.9
Total CHOL/HDL Ratio: 5
Triglycerides: 206 mg/dL — ABNORMAL HIGH (ref 0.0–149.0)
VLDL: 41.2 mg/dL — AB (ref 0.0–40.0)

## 2014-07-01 LAB — BRAIN NATRIURETIC PEPTIDE: PRO B NATRI PEPTIDE: 41 pg/mL (ref 0.0–100.0)

## 2014-07-01 NOTE — Patient Instructions (Signed)
Your physician recommends that you continue on your current medications as directed. Please refer to the Current Medication list given to you today. Your physician recommends that you return for lab work today (BNP, LIPIDS)

## 2014-07-02 ENCOUNTER — Other Ambulatory Visit: Payer: Self-pay | Admitting: Hematology & Oncology

## 2014-07-04 ENCOUNTER — Other Ambulatory Visit: Payer: Self-pay | Admitting: Hematology & Oncology

## 2014-08-14 ENCOUNTER — Ambulatory Visit (HOSPITAL_BASED_OUTPATIENT_CLINIC_OR_DEPARTMENT_OTHER): Payer: 59 | Admitting: Hematology & Oncology

## 2014-08-14 ENCOUNTER — Other Ambulatory Visit (HOSPITAL_BASED_OUTPATIENT_CLINIC_OR_DEPARTMENT_OTHER): Payer: 59 | Admitting: Lab

## 2014-08-14 ENCOUNTER — Encounter: Payer: Self-pay | Admitting: Hematology & Oncology

## 2014-08-14 ENCOUNTER — Ambulatory Visit (HOSPITAL_BASED_OUTPATIENT_CLINIC_OR_DEPARTMENT_OTHER): Payer: 59

## 2014-08-14 VITALS — BP 147/94 | HR 69 | Temp 98.3°F | Resp 18

## 2014-08-14 VITALS — BP 155/99 | HR 83 | Temp 98.0°F | Resp 20 | Ht 68.0 in | Wt 265.0 lb

## 2014-08-14 DIAGNOSIS — I10 Essential (primary) hypertension: Secondary | ICD-10-CM

## 2014-08-14 DIAGNOSIS — D751 Secondary polycythemia: Secondary | ICD-10-CM

## 2014-08-14 DIAGNOSIS — D45 Polycythemia vera: Secondary | ICD-10-CM

## 2014-08-14 LAB — COMPREHENSIVE METABOLIC PANEL
ALBUMIN: 4.1 g/dL (ref 3.5–5.2)
ALK PHOS: 87 U/L (ref 39–117)
ALT: 34 U/L (ref 0–53)
AST: 26 U/L (ref 0–37)
BUN: 18 mg/dL (ref 6–23)
CALCIUM: 9 mg/dL (ref 8.4–10.5)
CHLORIDE: 101 meq/L (ref 96–112)
CO2: 31 mEq/L (ref 19–32)
Creatinine, Ser: 1.19 mg/dL (ref 0.50–1.35)
Glucose, Bld: 180 mg/dL — ABNORMAL HIGH (ref 70–99)
Potassium: 4.2 mEq/L (ref 3.5–5.3)
SODIUM: 138 meq/L (ref 135–145)
TOTAL PROTEIN: 6.4 g/dL (ref 6.0–8.3)
Total Bilirubin: 0.3 mg/dL (ref 0.2–1.2)

## 2014-08-14 LAB — CBC WITH DIFFERENTIAL (CANCER CENTER ONLY)
BASO#: 0.2 10*3/uL (ref 0.0–0.2)
BASO%: 2.8 % — AB (ref 0.0–2.0)
EOS%: 5.7 % (ref 0.0–7.0)
Eosinophils Absolute: 0.4 10*3/uL (ref 0.0–0.5)
HCT: 41.4 % (ref 38.7–49.9)
HEMOGLOBIN: 13 g/dL (ref 13.0–17.1)
LYMPH#: 1.5 10*3/uL (ref 0.9–3.3)
LYMPH%: 20.7 % (ref 14.0–48.0)
MCH: 21.4 pg — ABNORMAL LOW (ref 28.0–33.4)
MCHC: 31.4 g/dL — AB (ref 32.0–35.9)
MCV: 68 fL — AB (ref 82–98)
MONO#: 0.5 10*3/uL (ref 0.1–0.9)
MONO%: 6.7 % (ref 0.0–13.0)
NEUT#: 4.8 10*3/uL (ref 1.5–6.5)
NEUT%: 64.1 % (ref 40.0–80.0)
Platelets: 230 10*3/uL (ref 145–400)
RBC: 6.07 10*6/uL — ABNORMAL HIGH (ref 4.20–5.70)
RDW: 20.8 % — AB (ref 11.1–15.7)
WBC: 7.4 10*3/uL (ref 4.0–10.0)

## 2014-08-14 MED ORDER — ALPRAZOLAM 0.5 MG PO TABS: ORAL_TABLET | ORAL | Status: DC

## 2014-08-14 MED ORDER — METOLAZONE 5 MG PO TABS: ORAL_TABLET | ORAL | Status: DC

## 2014-08-14 NOTE — Progress Notes (Signed)
Hematology and Oncology Follow Up Visit  Chase Gutierrez [id number] 1951/01/31 63 y.o. 08/14/2014   Principle Diagnosis:  Polycythemia vera - JAK2 NEGATIVE  Current Therapy:    Phlebotomy to maintain hematocrit below 40%  Aspirin 81 mg by mouth daily     Interim History:  Chase Gutierrez is back for followup. He is doing pretty well. He does feel a little bit more tired.  He has not been phlebotomized for probably 3 months.  He is going to bridge tournaments. His next tournament is the end of December.  He is watching his blood sugars. He is on CPAP.  He's had no problems with fever. He's had no rashes. He's had no change in bowel or bladder habits.   He's worried about his diuretics. He is not urinating as much. He is a some weight.  Told him to try some Zaroxolyn before the Lasix. This may help.  He's had no problem with headache. He's had no visual issues. He's had no mouth sores.  He's worried about having to see a heart failure doctor. He wants know who to see. He does see a cardiologist. I'm sure that she can make a recommendation for this.    Medications: Current outpatient prescriptions: ALPRAZolam (XANAX) 0.5 MG tablet, TAKE TWO TABLETS TWICE DAILY AS NEEDED (Patient taking differently: AS NEEDED), Disp: 60 tablet, Rfl: 2;  amitriptyline (ELAVIL) 25 MG tablet, Take 25 mg by mouth at bedtime. , Disp: , Rfl: ;  aspirin 81 MG EC tablet, Take 81 mg by mouth daily., Disp: , Rfl: ;  COLCRYS 0.6 MG tablet, Take 0.6 mg by mouth as needed. , Disp: , Rfl:  furosemide (LASIX) 40 MG tablet, Take 1 tablet (40 mg total) by mouth every 3 (three) days. (Patient taking differently: Take 40 mg by mouth every 3 (three) days. Pt. States Dr. Harrington Challenger increased to 80 mg PRN), Disp: 30 tablet, Rfl: ;  hydrochlorothiazide (HYDRODIURIL) 12.5 MG tablet, Take 12.5 mg by mouth daily.  , Disp: , Rfl: ;  ibuprofen (ADVIL,MOTRIN) 400 MG tablet, Take 400 mg by mouth as needed.  , Disp: , Rfl:  levothyroxine  (SYNTHROID) 25 MCG tablet, Take 25 mcg by mouth daily.  , Disp: , Rfl: ;  lisinopril (PRINIVIL,ZESTRIL) 20 MG tablet, Take 10 mg by mouth daily. , Disp: , Rfl: ;  Multiple Vitamin (MULTIVITAMIN) capsule, Take 1 capsule by mouth daily.  , Disp: , Rfl: ;  naproxen (NAPROSYN) 500 MG tablet, Take 250 mg by mouth as needed. , Disp: , Rfl: ;  NUVIGIL 250 MG tablet, Take 125 mg by mouth as needed. Now taking 1/2 tab., Disp: , Rfl:  potassium chloride SA (K-DUR,KLOR-CON) 20 MEQ tablet, Take 20 mEq by mouth once. Pt. States he takes when he uses Lasix, Disp: , Rfl: ;  ANDROGEL PUMP 20.25 MG/ACT (1.62%) GEL, ON HOLD UNTIL PT REVIEWS WITH DR. Marin Olp 02-28-14, Disp: , Rfl:   Allergies: No Known Allergies  Past Medical History, Surgical history, Social history, and Family History were reviewed and updated.  Review of Systems: As above  Physical Exam:  height is 5\' 8"  (1.727 m) and weight is 265 lb (120.203 kg). His oral temperature is 98 F (36.7 C). His blood pressure is 155/99 and his pulse is 83. His respiration is 20.   Mildly obese white gentleman. Head and neck exam shows no ocular or oral lesions. He has no palpable cervical or supraclavicular lymph nodes. Lungs are clear. Cardiac exam regular rate and  rhythm with no murmurs rubs or bruits. I really cannot detect any click from the prosthetic valve. Abdomen is soft. He is mildly obese. He has no fluid wave. There is no palpable liver or spleen tip. Back exam no tenderness over the spine ribs or hips. Extremities shows no clubbing cyanosis or edema. As directed of his joints. Skin exam shows no rashes, ecchymosis or petechia. Neurological exam shows no focal neurological deficits.  Lab Results  Component Value Date   WBC 7.4 08/14/2014   HGB 13.0 08/14/2014   HCT 41.4 08/14/2014   MCV 68* 08/14/2014   PLT 230 08/14/2014     Chemistry      Component Value Date/Time   NA 140 06/13/2014 0838   NA 138 02/28/2014 0830   K 4.4 06/13/2014 0838   K 3.8  02/28/2014 0830   CL 104 06/13/2014 0838   CL 98 02/28/2014 0830   CO2 27 06/13/2014 0838   CO2 29 02/28/2014 0830   BUN 18 06/13/2014 0838   BUN 30* 02/28/2014 0830   CREATININE 1.1 06/13/2014 0838   CREATININE 1.37* 02/28/2014 0830      Component Value Date/Time   CALCIUM 8.9 06/13/2014 0838   CALCIUM 9.2 02/28/2014 0830   ALKPHOS 108* 06/13/2014 0838   ALKPHOS 81 02/28/2014 0830   AST 26 06/13/2014 0838   AST 21 02/28/2014 0830   ALT 35 06/13/2014 0838   ALT 21 02/28/2014 0830   BILITOT 0.50 06/13/2014 0838   BILITOT 0.5 02/28/2014 0830         Impression and Plan: Chase Gutierrez is 63 year old gentleman with polycythemia. He does  need to be phlebotomized today. He will continue to take his aspirin.  We'll plan to get him back in 6 weeks. He is not sure when he will go back to [place].  Of note, he has a bridge treatment in late December. Phlebotomizing him now should make him a little bit more alert.  Volanda Napoleon, MD 12/16/20152:28 PM

## 2014-08-14 NOTE — Progress Notes (Signed)
Chase Gutierrez presents today for phlebotomy per MD orders. Phlebotomy procedure started at 1405 and ended at 1430. 542mL removed. Patient observed for 30 minutes after procedure without any incident. Patient tolerated procedure well. IV needle removed intact.

## 2014-08-14 NOTE — Patient Instructions (Signed)

## 2014-08-15 LAB — FERRITIN CHCC: Ferritin: 11 ng/ml — ABNORMAL LOW (ref 22–316)

## 2014-08-15 LAB — IRON AND TIBC CHCC
%SAT: 7 % — AB (ref 20–55)
Iron: 29 ug/dL — ABNORMAL LOW (ref 42–163)
TIBC: 411 ug/dL — ABNORMAL HIGH (ref 202–409)
UIBC: 382 ug/dL — AB (ref 117–376)

## 2014-09-19 ENCOUNTER — Encounter: Payer: Self-pay | Admitting: Hematology & Oncology

## 2014-09-25 ENCOUNTER — Other Ambulatory Visit: Payer: 59 | Admitting: Lab

## 2014-09-25 ENCOUNTER — Ambulatory Visit: Payer: 59 | Admitting: Hematology & Oncology

## 2014-10-14 ENCOUNTER — Encounter: Payer: Self-pay | Admitting: Family

## 2014-10-14 ENCOUNTER — Telehealth: Payer: Self-pay | Admitting: Hematology & Oncology

## 2014-10-14 ENCOUNTER — Ambulatory Visit (HOSPITAL_BASED_OUTPATIENT_CLINIC_OR_DEPARTMENT_OTHER): Payer: 59 | Admitting: Family

## 2014-10-14 ENCOUNTER — Other Ambulatory Visit (HOSPITAL_BASED_OUTPATIENT_CLINIC_OR_DEPARTMENT_OTHER): Payer: 59 | Admitting: Lab

## 2014-10-14 ENCOUNTER — Ambulatory Visit (HOSPITAL_BASED_OUTPATIENT_CLINIC_OR_DEPARTMENT_OTHER): Payer: 59

## 2014-10-14 VITALS — BP 149/93 | HR 72

## 2014-10-14 DIAGNOSIS — D45 Polycythemia vera: Secondary | ICD-10-CM

## 2014-10-14 DIAGNOSIS — I10 Essential (primary) hypertension: Secondary | ICD-10-CM

## 2014-10-14 LAB — CBC WITH DIFFERENTIAL (CANCER CENTER ONLY)
BASO#: 0.2 10*3/uL (ref 0.0–0.2)
BASO%: 2.7 % — ABNORMAL HIGH (ref 0.0–2.0)
EOS ABS: 0.5 10*3/uL (ref 0.0–0.5)
EOS%: 6.5 % (ref 0.0–7.0)
HEMATOCRIT: 40.7 % (ref 38.7–49.9)
HGB: 12.6 g/dL — ABNORMAL LOW (ref 13.0–17.1)
LYMPH#: 1.4 10*3/uL (ref 0.9–3.3)
LYMPH%: 18.3 % (ref 14.0–48.0)
MCH: 22 pg — AB (ref 28.0–33.4)
MCHC: 31 g/dL — ABNORMAL LOW (ref 32.0–35.9)
MCV: 71 fL — AB (ref 82–98)
MONO#: 0.6 10*3/uL (ref 0.1–0.9)
MONO%: 8 % (ref 0.0–13.0)
NEUT%: 64.5 % (ref 40.0–80.0)
NEUTROS ABS: 5.1 10*3/uL (ref 1.5–6.5)
Platelets: 223 10*3/uL (ref 145–400)
RBC: 5.73 10*6/uL — AB (ref 4.20–5.70)
RDW: 19.4 % — AB (ref 11.1–15.7)
WBC: 7.9 10*3/uL (ref 4.0–10.0)

## 2014-10-14 LAB — CMP (CANCER CENTER ONLY)
ALT(SGPT): 36 U/L (ref 10–47)
AST: 29 U/L (ref 11–38)
Albumin: 3.5 g/dL (ref 3.3–5.5)
Alkaline Phosphatase: 93 U/L — ABNORMAL HIGH (ref 26–84)
BILIRUBIN TOTAL: 0.5 mg/dL (ref 0.20–1.60)
BUN, Bld: 16 mg/dL (ref 7–22)
CO2: 31 mEq/L (ref 18–33)
Calcium: 9 mg/dL (ref 8.0–10.3)
Chloride: 102 mEq/L (ref 98–108)
Creat: 0.9 mg/dl (ref 0.6–1.2)
Glucose, Bld: 197 mg/dL — ABNORMAL HIGH (ref 73–118)
Potassium: 4.4 mEq/L (ref 3.3–4.7)
Sodium: 141 mEq/L (ref 128–145)
Total Protein: 6.7 g/dL (ref 6.4–8.1)

## 2014-10-14 LAB — FERRITIN CHCC: Ferritin: 13 ng/ml — ABNORMAL LOW (ref 22–316)

## 2014-10-14 LAB — HEMOGLOBIN A1C
Hgb A1c MFr Bld: 7.2 % — ABNORMAL HIGH (ref <5.7)
Mean Plasma Glucose: 160 mg/dL — ABNORMAL HIGH (ref <117)

## 2014-10-14 LAB — CHCC SATELLITE - SMEAR

## 2014-10-14 MED ORDER — SODIUM CHLORIDE 0.9 % IV SOLN
Freq: Once | INTRAVENOUS | Status: AC
  Administered 2014-10-14: 13:00:00 via INTRAVENOUS

## 2014-10-14 NOTE — Telephone Encounter (Signed)
Per in basket to schedule follow up in 2 months pt aware MD wants to see him in april. Pt scheduled 5-11 appointment.

## 2014-10-14 NOTE — Patient Instructions (Addendum)
Therapeutic Phlebotomy, Care After Refer to this sheet in the next few weeks. These instructions provide you with information on caring for yourself after your procedure. Your caregiver may also give you more specific instructions. Your treatment has been planned according to current medical practices, but problems sometimes occur. Call your caregiver if you have any problems or questions after your procedure. HOME CARE INSTRUCTIONS Most people can go back to their normal activities right away. Before you leave, be sure to ask if there is anything you should or should not do. In general, it would be wise to:  Keep the bandage dry. You can remove the bandage after about 5 hours.  Eat well-balanced meals for the next 24 hours.  Drink enough fluids to keep your urine clear or pale yellow.  Avoid drinking alcohol minimally until after eating.  Avoid smoking for at least 30 minutes after the procedure.  Avoid strenuous physical activity or heavy lifting or pulling for about 5 hours after the procedure.  Athletes should avoid strenuous exercise for 12 hours or more.  Change positions slowly for the remainder of the day to prevent light-headedness or fainting.  If you feel light-headed, lie down until the feeling subsides.  If you have bleeding from the needle insertion site, elevate your arm and press firmly on the site until the bleeding stops.  If bruising or bleeding appears under the skin, apply ice to the area for 15 to 20 minutes, 3 to 4 times per day. Put the ice in a plastic bag and place a towel between the bag of ice and your skin. Do this while you are awake for the first 24 hours. The ice packs can be stopped before 24 hours if the swelling goes away. If swelling persists after 24 hours, a warm, moist washcloth can be applied to the area for 15 to 20 minutes, 3 to 4 times per day. The warm, moist treatments can be stopped when the swelling goes away.  It is important to continue  further therapeutic phlebotomy as directed by your caregiver. SEEK MEDICAL CARE IF:  There is bleeding or fluid leaking from the needle insertion site.  The needle insertion site becomes swollen, red, or sore.  You feel light-headed, dizzy or nauseated, and the feeling does not go away.  You notice new bruising at the needle insertion site.  You feel more weak or tired than normal.  You develop a fever. SEEK IMMEDIATE MEDICAL CARE IF:   There is increased bleeding, pain, or swelling from the needle insertion site.  You have severe nausea or vomiting.  You have chest pain.  You have trouble breathing. MAKE SURE YOU:  Understand these instructions.  Will watch your condition.  Will get help right away if you are not doing well or get worse. Document Released: 01/18/2011 Document Revised: 12/31/2013 Document Reviewed: 01/18/2011 ExitCare Patient Information 2015 [employer]. This information is not intended to replace advice given to you by your health care provider. Make sure you discuss any questions you have with your health care provider.  Dehydration, Adult Dehydration is when you lose more fluids from the body than you take in. Vital organs like the kidneys, brain, and heart cannot function without a proper amount of fluids and salt. Any loss of fluids from the body can cause dehydration.  CAUSES   Vomiting.  Diarrhea.  Excessive sweating.  Excessive urine output.  Fever. SYMPTOMS  Mild dehydration  Thirst.  Dry lips.  Slightly dry mouth. Moderate   dehydration  Very dry mouth.  Sunken eyes.  Skin does not bounce back quickly when lightly pinched and released.  Dark urine and decreased urine production.  Decreased tear production.  Headache. Severe dehydration  Very dry mouth.  Extreme thirst.  Rapid, weak pulse (more than 100 beats per minute at rest).  Cold hands and feet.  Not able to sweat in spite of heat and temperature.  Rapid  breathing.  Blue lips.  Confusion and lethargy.  Difficulty being awakened.  Minimal urine production.  No tears. DIAGNOSIS  Your caregiver will diagnose dehydration based on your symptoms and your exam. Blood and urine tests will help confirm the diagnosis. The diagnostic evaluation should also identify the cause of dehydration. TREATMENT  Treatment of mild or moderate dehydration can often be done at home by increasing the amount of fluids that you drink. It is best to drink small amounts of fluid more often. Drinking too much at one time can make vomiting worse. Refer to the home care instructions below. Severe dehydration needs to be treated at the hospital where you will probably be given intravenous (IV) fluids that contain water and electrolytes. HOME CARE INSTRUCTIONS   Ask your caregiver about specific rehydration instructions.  Drink enough fluids to keep your urine clear or pale yellow.  Drink small amounts frequently if you have nausea and vomiting.  Eat as you normally do.  Avoid:  Foods or drinks high in sugar.  Carbonated drinks.  Juice.  Extremely hot or cold fluids.  Drinks with caffeine.  Fatty, greasy foods.  Alcohol.  Tobacco.  Overeating.  Gelatin desserts.  Wash your hands well to avoid spreading bacteria and viruses.  Only take over-the-counter or prescription medicines for pain, discomfort, or fever as directed by your caregiver.  Ask your caregiver if you should continue all prescribed and over-the-counter medicines.  Keep all follow-up appointments with your caregiver. SEEK MEDICAL CARE IF:  You have abdominal pain and it increases or stays in one area (localizes).  You have a rash, stiff neck, or severe headache.  You are irritable, sleepy, or difficult to awaken.  You are weak, dizzy, or extremely thirsty. SEEK IMMEDIATE MEDICAL CARE IF:   You are unable to keep fluids down or you get worse despite treatment.  You have  frequent episodes of vomiting or diarrhea.  You have blood or green matter (bile) in your vomit.  You have blood in your stool or your stool looks black and tarry.  You have not urinated in 6 to 8 hours, or you have only urinated a small amount of very dark urine.  You have a fever.  You faint. MAKE SURE YOU:   Understand these instructions.  Will watch your condition.  Will get help right away if you are not doing well or get worse. Document Released: 08/16/2005 Document Revised: 11/08/2011 Document Reviewed: 04/05/2011 Exit[employer] Patient Information 2015 [employer]. This information is not intended to replace advice given to you by your health care provider. Make sure you discuss any questions you have with your health care provider.  

## 2014-10-14 NOTE — Progress Notes (Signed)
Chase Gutierrez presents today for phlebotomy per MD orders. Phlebotomy procedure started at 1250 and ended at 1310. 500 ml  removed. Patient observed for 30 minutes after procedure without any incident. Patient tolerated procedure well. IV needle removed intact.

## 2014-10-14 NOTE — Progress Notes (Signed)
[place]  [phone] [phone]  ID: Chase Gutierrez OB: 1951-05-17 MR#: [id number] SVX#:[id number] Patient Care Team: Chase Redwood, MD as PCP - General (Internal Medicine) Chase Records, MD (Cardiology)  DIAGNOSIS: Polycythemia vera - JAK2 NEGATIVE  INTERVAL HISTORY: Chase Gutierrez is back today for follow-up. He is doing very well. He just returned from [facility] with his wife.  He was on 3 diuretics and became dehydrated. He had some episodes of dizziness and his PCP had to make an adjustment to his medications.  He is using his C-PAP at night now and is resting better.  The swelling in his legs is gone. He has no tenderness numbness or tingling in his extremities.  He denies fever, chills, n/v, cough, rash, dizziness, headache, SOB, chest pain, palpitations, abdominal pain, constipation, diarrhea, blood in urine or stool. His appetite is good and he is making sure to stay hydrated.  He states that he feels very tired and weak after each phlebotomy. We will give him fluids today afterwards.  His last phlebotomy was in December. His Hct at that time was 41.4.   CURRENT TREATMENT: Phlebotomy to maintain hematocrit below 40%  Aspirin 81 mg by mouth daily  REVIEW OF SYSTEMS: All other 10 point review of systems is negative.   PAST MEDICAL HISTORY: Past Medical History  Diagnosis Date  . Depressed   . Allergic rhinitis   . Nephrolithiasis   . Dyslipidemia   . Aortic stenosis   . Hypertension   . OSA (obstructive sleep apnea)   . Sinus complaint   . Obesity   . Polycythemia vera(238.4) 06/16/2012  . Adenomatous colon polyp     2005   PAST SURGICAL HISTORY: Past Surgical History  Procedure Laterality Date  . Tonsillectomy  1982, 1993  . Aortic valve replacement  10/15/2008  . Hemorrhoid surgery  1990   FAMILY HISTORY Family History  Problem Relation Age of Onset  . Coronary artery disease Mother   . Diabetes Father   . Coronary  artery disease Father   . Lung cancer Father    GYNECOLOGIC HISTORY:  No LMP for male patient.   SOCIAL HISTORY:  History   Social History  . Marital Status: Divorced    Spouse Name: N/A  . Number of Children: 1  . Years of Education: N/A   Occupational History  . attorney Unemployed   Social History Main Topics  . Smoking status: Never Smoker   . Smokeless tobacco: Never Used     Comment: never used  tobacco  . Alcohol Use: 0.0 oz/week    0 Standard drinks or equivalent per week     Comment: rare  . Drug Use: Not on file  . Sexual Activity: Not on file   Other Topics Concern  . Not on file   Social History Narrative   Lives with daughter.   ADVANCED DIRECTIVES: <no information>  HEALTH MAINTENANCE: History  Substance Use Topics  . Smoking status: Never Smoker   . Smokeless tobacco: Never Used     Comment: never used  tobacco  . Alcohol Use: 0.0 oz/week    0 Standard drinks or equivalent per week     Comment: rare   Colonoscopy: PAP: Bone density: Lipid panel:  No Known Allergies  Current Outpatient Prescriptions  Medication Sig Dispense Refill  . ALPRAZolam (XANAX) 0.5 MG tablet TAKE TWO TABLETS TWICE DAILY AS NEEDED 60 tablet 2  . amitriptyline (ELAVIL) 25 MG tablet Take 25  mg by mouth at bedtime.     . ANDROGEL PUMP 20.25 MG/ACT (1.62%) GEL     . aspirin 81 MG EC tablet Take 81 mg by mouth daily.    Marland Kitchen COLCRYS 0.6 MG tablet Take 0.6 mg by mouth as needed.     . furosemide (LASIX) 40 MG tablet Take 1 tablet (40 mg total) by mouth every 3 (three) days. (Patient taking differently: Take 40 mg by mouth every 3 (three) days. Pt states lasix dec.to 40 mg every other day. D/T dehydration) 30 tablet   . hydrochlorothiazide (HYDRODIURIL) 12.5 MG tablet Take 12.5 mg by mouth daily.      Marland Kitchen ibuprofen (ADVIL,MOTRIN) 400 MG tablet Take 400 mg by mouth as needed.      Marland Kitchen levothyroxine (SYNTHROID) 25 MCG tablet Take 25 mcg by mouth daily.      Marland Kitchen lisinopril  (PRINIVIL,ZESTRIL) 20 MG tablet Take 10 mg by mouth daily.     . Multiple Vitamin (MULTIVITAMIN) capsule Take 1 capsule by mouth daily.      . naproxen (NAPROSYN) 500 MG tablet Take 250 mg by mouth as needed.     . NON FORMULARY Take by mouth every morning. VITA-PULSE    . NUVIGIL 250 MG tablet Take 125 mg by mouth as needed. Now taking 1/2 tab.    . potassium chloride SA (K-DUR,KLOR-CON) 20 MEQ tablet Take 20 mEq by mouth once. Pt. States he takes when he uses Lasix     No current facility-administered medications for this visit.   OBJECTIVE: Filed Vitals:   10/14/14 1214  BP: 154/87  Pulse: 80  Temp: 97.8 F (36.6 C)  Resp: 20   Body mass index is 41.22 kg/(m^2). ECOG FS:0 - Asymptomatic Ocular: Sclerae unicteric, pupils equal, round and reactive to light Ear-nose-throat: Oropharynx clear, dentition fair Lymphatic: No cervical or supraclavicular adenopathy Lungs no rales or rhonchi, good excursion bilaterally Heart regular rate and rhythm, no murmur appreciated Abd soft, nontender, positive bowel sounds MSK no focal spinal tenderness, no joint edema Neuro: non-focal, well-oriented, appropriate affect  LAB RESULTS: CMP     Component Value Date/Time   NA 141 10/14/2014 1146   NA 138 08/14/2014 1224   K 4.4 10/14/2014 1146   K 4.2 08/14/2014 1224   CL 102 10/14/2014 1146   CL 101 08/14/2014 1224   CO2 31 10/14/2014 1146   CO2 31 08/14/2014 1224   GLUCOSE 197* 10/14/2014 1146   GLUCOSE 180* 08/14/2014 1224   BUN 16 10/14/2014 1146   BUN 18 08/14/2014 1224   CREATININE 0.9 10/14/2014 1146   CREATININE 1.19 08/14/2014 1224   CALCIUM 9.0 10/14/2014 1146   CALCIUM 9.0 08/14/2014 1224   PROT 6.7 10/14/2014 1146   PROT 6.4 08/14/2014 1224   ALBUMIN 4.1 08/14/2014 1224   AST 29 10/14/2014 1146   AST 26 08/14/2014 1224   ALT 36 10/14/2014 1146   ALT 34 08/14/2014 1224   ALKPHOS 93* 10/14/2014 1146   ALKPHOS 87 08/14/2014 1224   BILITOT 0.50 10/14/2014 1146   BILITOT 0.3  08/14/2014 1224   GFRNONAA 69.61 05/15/2010 1001   GFRAA  04/24/2010 0945    >60        The eGFR has been calculated using the MDRD equation. This calculation has not been validated in all clinical situations. eGFR's persistently <60 mL/min signify possible Chronic Kidney Disease.   No results found for: SPEP Lab Results  Component Value Date   WBC 7.9 10/14/2014   NEUTROABS  5.1 10/14/2014   HGB 12.6* 10/14/2014   HCT 40.7 10/14/2014   MCV 71* 10/14/2014   PLT 223 10/14/2014   No results found for: LABCA2 No components found for: [id number] No results for input(s): INR in the last 168 hours.  STUDIES: No results found.  ASSESSMENT/PLAN: Mr. Ishler is 64 year old gentleman with polycythemia. He was last phlebotomized in December for a Hct of 41.4. He is asymptomatic at this time.  His Hct today is 40.7 so we will go ahead with his phlebotomy.  We will also give him some fluids afterwards.  We'll plan to get him back in 2 months for labs and follow-up. He will be heading back to [place] soon and will make arrangements for lab work while Ardeen Fillers is there. He will call us with a fax number for lab work.  He knows to call here with any questions or concerns and to go to the ED in the event of an emergency. We can certainly see him back sooner if need be.   Eliezer Bottom, NP 10/14/2014 12:36 PM

## 2014-10-15 LAB — VITAMIN D 25 HYDROXY (VIT D DEFICIENCY, FRACTURES): Vit D, 25-Hydroxy: 21 ng/mL — ABNORMAL LOW (ref 30–100)

## 2015-01-01 ENCOUNTER — Encounter: Payer: Self-pay | Admitting: Internal Medicine

## 2015-01-08 ENCOUNTER — Encounter: Payer: Self-pay | Admitting: Hematology & Oncology

## 2015-01-08 ENCOUNTER — Other Ambulatory Visit: Payer: Self-pay | Admitting: *Deleted

## 2015-01-08 ENCOUNTER — Other Ambulatory Visit (HOSPITAL_BASED_OUTPATIENT_CLINIC_OR_DEPARTMENT_OTHER): Payer: 59

## 2015-01-08 ENCOUNTER — Ambulatory Visit (HOSPITAL_BASED_OUTPATIENT_CLINIC_OR_DEPARTMENT_OTHER): Payer: 59

## 2015-01-08 ENCOUNTER — Ambulatory Visit (HOSPITAL_BASED_OUTPATIENT_CLINIC_OR_DEPARTMENT_OTHER): Payer: 59 | Admitting: Hematology & Oncology

## 2015-01-08 VITALS — HR 84 | Temp 98.2°F | Resp 18 | Ht 68.0 in | Wt 257.0 lb

## 2015-01-08 VITALS — BP 130/86 | HR 81

## 2015-01-08 DIAGNOSIS — D751 Secondary polycythemia: Secondary | ICD-10-CM | POA: Diagnosis not present

## 2015-01-08 DIAGNOSIS — E349 Endocrine disorder, unspecified: Secondary | ICD-10-CM

## 2015-01-08 DIAGNOSIS — M818 Other osteoporosis without current pathological fracture: Secondary | ICD-10-CM

## 2015-01-08 DIAGNOSIS — D45 Polycythemia vera: Secondary | ICD-10-CM

## 2015-01-08 LAB — LIPID PANEL
CHOL/HDL RATIO: 5.7 ratio
CHOLESTEROL: 154 mg/dL (ref 0–200)
HDL: 27 mg/dL — ABNORMAL LOW (ref >=40)
LDL Cholesterol: 92 mg/dL (ref 0–99)
Triglycerides: 173 mg/dL — ABNORMAL HIGH (ref <150)
VLDL: 35 mg/dL (ref 0–40)

## 2015-01-08 LAB — CBC WITH DIFFERENTIAL (CANCER CENTER ONLY)
BASO#: 0.2 10*3/uL (ref 0.0–0.2)
BASO%: 2.8 % — ABNORMAL HIGH (ref 0.0–2.0)
EOS%: 4.5 % (ref 0.0–7.0)
Eosinophils Absolute: 0.3 10*3/uL (ref 0.0–0.5)
HCT: 43.4 % (ref 38.7–49.9)
HGB: 14 g/dL (ref 13.0–17.1)
LYMPH#: 1.6 10*3/uL (ref 0.9–3.3)
LYMPH%: 21 % (ref 14.0–48.0)
MCH: 22.4 pg — ABNORMAL LOW (ref 28.0–33.4)
MCHC: 32.3 g/dL (ref 32.0–35.9)
MCV: 69 fL — ABNORMAL LOW (ref 82–98)
MONO#: 0.7 10*3/uL (ref 0.1–0.9)
MONO%: 9.1 % (ref 0.0–13.0)
NEUT%: 62.6 % (ref 40.0–80.0)
NEUTROS ABS: 4.7 10*3/uL (ref 1.5–6.5)
PLATELETS: 237 10*3/uL (ref 145–400)
RBC: 6.26 10*6/uL — ABNORMAL HIGH (ref 4.20–5.70)
RDW: 20 % — ABNORMAL HIGH (ref 11.1–15.7)
WBC: 7.5 10*3/uL (ref 4.0–10.0)

## 2015-01-08 LAB — COMPREHENSIVE METABOLIC PANEL (CC13)
ALBUMIN: 4.1 g/dL (ref 3.5–5.0)
ALT: 34 U/L (ref 0–55)
AST: 29 U/L (ref 5–34)
Alkaline Phosphatase: 93 U/L (ref 40–150)
Anion Gap: 11 mEq/L (ref 3–11)
BUN: 21.4 mg/dL (ref 7.0–26.0)
CO2: 29 meq/L (ref 22–29)
Calcium: 9.4 mg/dL (ref 8.4–10.4)
Chloride: 101 mEq/L (ref 98–109)
Creatinine: 1.1 mg/dL (ref 0.7–1.3)
EGFR: 69 mL/min/{1.73_m2} — AB (ref >90)
Glucose: 118 mg/dl (ref 70–140)
Potassium: 3.9 mEq/L (ref 3.5–5.1)
Sodium: 141 mEq/L (ref 136–145)
Total Bilirubin: 0.48 mg/dL (ref 0.20–1.20)
Total Protein: 7.2 g/dL (ref 6.4–8.3)

## 2015-01-08 LAB — IRON AND TIBC CHCC
%SAT: 8 % — AB (ref 20–55)
Iron: 37 ug/dL — ABNORMAL LOW (ref 42–163)
TIBC: 455 ug/dL — ABNORMAL HIGH (ref 202–409)
UIBC: 418 ug/dL — ABNORMAL HIGH (ref 117–376)

## 2015-01-08 LAB — HEMOGLOBIN A1C
HEMOGLOBIN A1C: 6.8 % — AB (ref <5.7)
MEAN PLASMA GLUCOSE: 148 mg/dL — AB (ref <117)

## 2015-01-08 LAB — FERRITIN CHCC: Ferritin: 15 ng/ml — ABNORMAL LOW (ref 22–316)

## 2015-01-08 LAB — CHCC SATELLITE - SMEAR

## 2015-01-08 NOTE — Progress Notes (Signed)
Chase Gutierrez presents today for phlebotomy per MD orders. Phlebotomy procedure started at 1200 and ended at 1207. 500 ml removed. Patient observed for 30 minutes after procedure without any incident. Patient tolerated procedure well. IV needle removed intact.

## 2015-01-08 NOTE — Patient Instructions (Signed)

## 2015-01-08 NOTE — Progress Notes (Signed)
Hematology and Oncology Follow Up Visit  Chase Gutierrez [id number] Nov 08, 1950 64 y.o. 01/08/2015   Principle Diagnosis:  Polycythemia vera - JAK2 NEGATIVE  Current Therapy:    Phlebotomy to maintain hematocrit below 40%  Aspirin 81 mg by mouth daily     Interim History:  Mr.  Gutierrez is back for followup. It has been several months since we phlebotomized him. He feels that he probably needs to be phlebotomized. Every just came up from [place]. He and his wife have a house down in [place]. He came up for a bridge tournament.  He has lost some weight. He is happy about this. He is on some diuretics. He sees his heart doctor within a week. We did go ahead and get a hemoglobin A1c and lipid panel.  He's had no headache. He's had no fever. He's had no diaphoresis.  He's had no change in bowel or bladder habits.  He does have occasional swelling in his ankles.   Medications:  Current outpatient prescriptions:  .  ALPRAZolam (XANAX) 0.5 MG tablet, TAKE TWO TABLETS TWICE DAILY AS NEEDED, Disp: 60 tablet, Rfl: 2 .  amitriptyline (ELAVIL) 25 MG tablet, Take 25 mg by mouth at bedtime. , Disp: , Rfl:  .  ANDROGEL PUMP 20.25 MG/ACT (1.62%) GEL, , Disp: , Rfl:  .  aspirin 81 MG EC tablet, Take 81 mg by mouth daily., Disp: , Rfl:  .  COLCRYS 0.6 MG tablet, Take 0.6 mg by mouth as needed. , Disp: , Rfl:  .  furosemide (LASIX) 40 MG tablet, Take 1 tablet (40 mg total) by mouth every 3 (three) days. (Patient taking differently: Take 40 mg by mouth every 3 (three) days. Pt states lasix dec.to 40 mg every other day. D/T dehydration), Disp: 30 tablet, Rfl:  .  hydrochlorothiazide (HYDRODIURIL) 12.5 MG tablet, Take 12.5 mg by mouth daily.  , Disp: , Rfl:  .  ibuprofen (ADVIL,MOTRIN) 400 MG tablet, Take 400 mg by mouth as needed.  , Disp: , Rfl:  .  levothyroxine (SYNTHROID) 25 MCG tablet, Take 25 mcg by mouth daily.  , Disp: , Rfl:  .  lisinopril (PRINIVIL,ZESTRIL) 20 MG tablet, Take 10 mg by  mouth daily. , Disp: , Rfl:  .  metolazone (ZAROXOLYN) 5 MG tablet, Take 5 mg by mouth as needed. Take 1 hr. Before taking lasix, Disp: , Rfl:  .  Multiple Vitamin (MULTIVITAMIN) capsule, Take 1 capsule by mouth daily.  , Disp: , Rfl:  .  naproxen (NAPROSYN) 500 MG tablet, Take 250 mg by mouth as needed. , Disp: , Rfl:  .  NON FORMULARY, Take by mouth every morning. VITA-PULSE, Disp: , Rfl:  .  NUVIGIL 250 MG tablet, Take 125 mg by mouth as needed. Now taking 1/2 tab., Disp: , Rfl:  .  potassium chloride SA (K-DUR,KLOR-CON) 20 MEQ tablet, Take 20 mEq by mouth once. Pt. States he takes when he uses Lasix, Disp: , Rfl:   Allergies: No Known Allergies  Past Medical History, Surgical history, Social history, and Family History were reviewed and updated.  Review of Systems: As above  Physical Exam:  height is 5\' 8"  (1.727 m) and weight is 257 lb (116.574 kg). His oral temperature is 98.2 F (36.8 C). His pulse is 84. His respiration is 18.   Mildly obese white gentleman. Head and neck exam shows no ocular or oral lesions. He has no palpable cervical or supraclavicular lymph nodes. Lungs are clear. Cardiac exam regular rate and  rhythm with no murmurs rubs or bruits. I really cannot detect any click from the prosthetic valve. Abdomen is soft. He is mildly obese. He has no fluid wave. There is no palpable liver or spleen tip. Back exam no tenderness over the spine ribs or hips. Extremities shows no clubbing cyanosis or edema. As directed of his joints. Skin exam shows no rashes, ecchymosis or petechia. Neurological exam shows no focal neurological deficits.  Lab Results  Component Value Date   WBC 7.5 01/08/2015   HGB 14.0 01/08/2015   HCT 43.4 01/08/2015   MCV 69* 01/08/2015   PLT 237 01/08/2015     Chemistry      Component Value Date/Time   NA 141 10/14/2014 1146   NA 138 08/14/2014 1224   K 4.4 10/14/2014 1146   K 4.2 08/14/2014 1224   CL 102 10/14/2014 1146   CL 101 08/14/2014 1224    CO2 31 10/14/2014 1146   CO2 31 08/14/2014 1224   BUN 16 10/14/2014 1146   BUN 18 08/14/2014 1224   CREATININE 0.9 10/14/2014 1146   CREATININE 1.19 08/14/2014 1224      Component Value Date/Time   CALCIUM 9.0 10/14/2014 1146   CALCIUM 9.0 08/14/2014 1224   ALKPHOS 93* 10/14/2014 1146   ALKPHOS 87 08/14/2014 1224   AST 29 10/14/2014 1146   AST 26 08/14/2014 1224   ALT 36 10/14/2014 1146   ALT 34 08/14/2014 1224   BILITOT 0.50 10/14/2014 1146   BILITOT 0.3 08/14/2014 1224         Impression and Plan: Chase Gutierrez is 64 year old gentleman with polycythemia. He does  need to be phlebotomized today. He will continue to take his aspirin.  We'll plan to get him back in 6 weeks. He will be going back to [place] soon. He will be back the end of June.   Of note, he has a bridge treatment later this week in town.   Phlebotomizing him now should make him a little bit more alert.  Volanda Napoleon, MD 5/11/20161:00 PM

## 2015-01-09 ENCOUNTER — Encounter: Payer: Self-pay | Admitting: Internal Medicine

## 2015-01-09 ENCOUNTER — Ambulatory Visit (INDEPENDENT_AMBULATORY_CARE_PROVIDER_SITE_OTHER): Payer: 59 | Admitting: Internal Medicine

## 2015-01-09 VITALS — BP 122/88 | HR 90 | Ht 68.0 in | Wt 254.0 lb

## 2015-01-09 DIAGNOSIS — I119 Hypertensive heart disease without heart failure: Secondary | ICD-10-CM

## 2015-01-09 DIAGNOSIS — E038 Other specified hypothyroidism: Secondary | ICD-10-CM | POA: Diagnosis not present

## 2015-01-09 LAB — VITAMIN D 25 HYDROXY (VIT D DEFICIENCY, FRACTURES): Vit D, 25-Hydroxy: 31 ng/mL (ref 30–100)

## 2015-01-09 NOTE — Patient Instructions (Signed)
Medication Instructions:  Your physician recommends that you continue on your current medications as directed. Please refer to the Current Medication list given to you today.  Labwork: Your physician recommends that you have labs today TSH  Testing/Procedures: NONE  Follow-Up: Your physician recommends that you schedule a follow-up appointment as needed.  Any Other Special Instructions Will Be Listed Below (If Applicable).

## 2015-01-09 NOTE — Progress Notes (Signed)
Cardiology Office Note   Date:  01/09/2015   ID:  Chase Gutierrez, DOB Oct 19, 1950, MRN [id number]  PCP:  Marton Redwood, MD  Cardiologist:   Dorris Carnes, MD   No chief complaint on file.  Patinet comes for f/u of aortic valve disease     History of Present Illness: Chase Gutierrez is a 64 y.o. male with a history of aortic stenosis (s/p AVR in 2010), HTN, thoracic aneurysm, diastolic CHF, obesity, sleep apnea (uses CPAP) I saw him in November 2015 I spoke to him earlier this spring  He had a period of increased SOB and edema  Wt up 20 Lbs  This responded to increased lasix and zaroxyln. He returned to [place] this week  He was seen by  P Ennever yesterday  Underwent phlebotomy for Hct of 43  Immed after these treatments he feels week  He holds lasix   Tries to drink fluids The patient says he will have good days and some days where he doesn't feel like doing anything.  Does have intermitt LE edeam  Takes lasxi prn   Drinks fluids  Does not weigh himself daily.      Current Outpatient Prescriptions  Medication Sig Dispense Refill  . ALPRAZolam (XANAX) 0.5 MG tablet TAKE TWO TABLETS TWICE DAILY AS NEEDED 60 tablet 2  . amitriptyline (ELAVIL) 25 MG tablet Take 25 mg by mouth at bedtime.     . ANDROGEL PUMP 20.25 MG/ACT (1.62%) GEL     . aspirin 81 MG EC tablet Take 81 mg by mouth daily.    Marland Kitchen COLCRYS 0.6 MG tablet Take 0.6 mg by mouth as needed.     . furosemide (LASIX) 40 MG tablet Take 40 mg by mouth as needed for fluid or edema.    . hydrochlorothiazide (HYDRODIURIL) 12.5 MG tablet Take 12.5 mg by mouth daily.      Marland Kitchen ibuprofen (ADVIL,MOTRIN) 400 MG tablet Take 400 mg by mouth as needed.      Marland Kitchen levothyroxine (SYNTHROID) 25 MCG tablet Take 25 mcg by mouth daily.      Marland Kitchen lisinopril (PRINIVIL,ZESTRIL) 20 MG tablet Take 10 mg by mouth daily.     . metolazone (ZAROXOLYN) 5 MG tablet Take 5 mg by mouth as needed. Take 1 hr. Before taking lasix    . Multiple Vitamin (MULTIVITAMIN) capsule  Take 1 capsule by mouth daily.      . naproxen (NAPROSYN) 500 MG tablet Take 250 mg by mouth as needed.     . NON FORMULARY Take by mouth every morning. VITA-PULSE    . NUVIGIL 250 MG tablet Take 125 mg by mouth as needed. Now taking 1/2 tab.    . potassium chloride SA (K-DUR,KLOR-CON) 20 MEQ tablet Take 20 mEq by mouth once. Pt. States he takes when he uses Lasix     No current facility-administered medications for this visit.    Allergies:   Review of patient's allergies indicates no known allergies.   Past Medical History  Diagnosis Date  . Depressed   . Allergic rhinitis   . Nephrolithiasis   . Dyslipidemia   . Aortic stenosis   . Hypertension   . OSA (obstructive sleep apnea)   . Sinus complaint   . Obesity   . Polycythemia vera(238.4) 06/16/2012  . Adenomatous colon polyp     2005    Past Surgical History  Procedure Laterality Date  . Tonsillectomy  1982, 1993  . Aortic valve replacement  10/15/2008  .  Hemorrhoid surgery  1990     Social History:  The patient  reports that he has never smoked. He has never used smokeless tobacco. He reports that he drinks alcohol.   Family History:  The patient's family history includes Coronary artery disease in his father and mother; Diabetes in his father; Lung cancer in his father.    ROS:  Please see the history of present illness. All other systems are reviewed and  Negative to the above problem except as noted.    PHYSICAL EXAM: VS:  BP 122/88 mmHg  Pulse 90  Ht 5\' 8"  (1.727 m)  Wt 254 lb (115.214 kg)  BMI 38.63 kg/m2  GEN: Well nourished, well developed, in no acute distress HEENT: normal Neck: no JVD, carotid bruits, or masses Cardiac: RRR; II/VI systolic murmur base  No rubs, or gallops,no edema  Respiratory:  clear to auscultation bilaterally, normal work of breathing GI: soft, nontender, nondistended, + BS  No hepatomegaly  MS: no deformity Moving all extremities   Skin: warm and dry, no rash Neuro:  Strength  and sensation are intact Psych: euthymic mood, full affect   EKG:  EKG is ordered today.  SR     Lipid Panel    Component Value Date/Time   CHOL 154 01/08/2015 1210   TRIG 173* 01/08/2015 1210   HDL 27* 01/08/2015 1210   CHOLHDL 5.7 01/08/2015 1210   VLDL 35 01/08/2015 1210   LDLCALC 92 01/08/2015 1210   LDLDIRECT 101.0 07/01/2014 1102      Wt Readings from Last 3 Encounters:  01/09/15 254 lb (115.214 kg)  01/08/15 257 lb (116.574 kg)  10/14/14 271 lb (122.925 kg)      ASSESSMENT AND PLAN: 1.  Chronic diastolic CHF  Patient's volume status looks good  He is prob a little low on fluid after yesterday (Took less than 10 min).  I reviewed with him the pathophysiology of his diastolic chf (LV hypertrophy)   I do think he needs to continue to watch salt intake, watch fluid intake  Weigh daily in AM    2.  AVR  Last echo in 2015  Will need to follow periodically  3.  Thoracic aortic aneurysm.  WIll need f/u CT in the fall.  4.  DM  Hgb A1C is still increased  Discussed diet.  5.  HL  Needs better glucose control    Current medicines are reviewed at length with the patient today.  The patient does not have concerns regarding medicines.  The following changes have been made: No change He should weigh himself daily  Labs/ tests ordered today include:  TSH   No orders of the defined types were placed in this encounter.     Disposition:  Patinet will contact me with continued response.  F/U will be based on his return to [facility].  I have reviewed [facility] clinic website for other cardiologists in [place], Dorris Carnes, MD  01/09/2015 4:08 PM    [address], [place], [place]  [address] Phone: [phone]; Fax: [phone]

## 2015-01-10 ENCOUNTER — Telehealth: Payer: Self-pay | Admitting: *Deleted

## 2015-01-10 LAB — TSH: TSH: 1.23 u[IU]/mL (ref 0.35–4.50)

## 2015-01-10 NOTE — Telephone Encounter (Signed)
-----   Message from Volanda Napoleon, MD sent at 01/09/2015  5:55 PM EDT ----- Please send this to his primary MD and his cardiologist, Dorris Carnes.  Laurey Arrow

## 2015-02-27 ENCOUNTER — Other Ambulatory Visit (HOSPITAL_BASED_OUTPATIENT_CLINIC_OR_DEPARTMENT_OTHER): Payer: 59

## 2015-02-27 ENCOUNTER — Ambulatory Visit (HOSPITAL_BASED_OUTPATIENT_CLINIC_OR_DEPARTMENT_OTHER): Payer: 59 | Admitting: Family

## 2015-02-27 ENCOUNTER — Ambulatory Visit (HOSPITAL_BASED_OUTPATIENT_CLINIC_OR_DEPARTMENT_OTHER): Payer: 59

## 2015-02-27 ENCOUNTER — Other Ambulatory Visit: Payer: Self-pay | Admitting: *Deleted

## 2015-02-27 VITALS — BP 112/74 | HR 79 | Temp 97.7°F | Resp 20 | Wt 258.0 lb

## 2015-02-27 DIAGNOSIS — D45 Polycythemia vera: Secondary | ICD-10-CM

## 2015-02-27 DIAGNOSIS — E349 Endocrine disorder, unspecified: Secondary | ICD-10-CM

## 2015-02-27 DIAGNOSIS — R5383 Other fatigue: Secondary | ICD-10-CM | POA: Diagnosis not present

## 2015-02-27 DIAGNOSIS — R21 Rash and other nonspecific skin eruption: Secondary | ICD-10-CM | POA: Diagnosis not present

## 2015-02-27 DIAGNOSIS — M818 Other osteoporosis without current pathological fracture: Secondary | ICD-10-CM

## 2015-02-27 DIAGNOSIS — E663 Overweight: Secondary | ICD-10-CM

## 2015-02-27 LAB — CBC WITH DIFFERENTIAL (CANCER CENTER ONLY)
BASO#: 0.2 10*3/uL (ref 0.0–0.2)
BASO%: 2.8 % — ABNORMAL HIGH (ref 0.0–2.0)
EOS ABS: 0.5 10*3/uL (ref 0.0–0.5)
EOS%: 6.7 % (ref 0.0–7.0)
HCT: 40.9 % (ref 38.7–49.9)
HGB: 13 g/dL (ref 13.0–17.1)
LYMPH#: 1.6 10*3/uL (ref 0.9–3.3)
LYMPH%: 21.4 % (ref 14.0–48.0)
MCH: 22.1 pg — AB (ref 28.0–33.4)
MCHC: 31.8 g/dL — ABNORMAL LOW (ref 32.0–35.9)
MCV: 70 fL — AB (ref 82–98)
MONO#: 0.6 10*3/uL (ref 0.1–0.9)
MONO%: 7.4 % (ref 0.0–13.0)
NEUT%: 61.7 % (ref 40.0–80.0)
NEUTROS ABS: 4.7 10*3/uL (ref 1.5–6.5)
Platelets: 242 10*3/uL (ref 145–400)
RBC: 5.88 10*6/uL — ABNORMAL HIGH (ref 4.20–5.70)
RDW: 19.3 % — ABNORMAL HIGH (ref 11.1–15.7)
WBC: 7.6 10*3/uL (ref 4.0–10.0)

## 2015-02-27 LAB — COMPREHENSIVE METABOLIC PANEL
ALBUMIN: 4 g/dL (ref 3.5–5.2)
ALT: 28 U/L (ref 0–53)
AST: 21 U/L (ref 0–37)
Alkaline Phosphatase: 84 U/L (ref 39–117)
BUN: 20 mg/dL (ref 6–23)
CO2: 24 mEq/L (ref 19–32)
CREATININE: 1.03 mg/dL (ref 0.50–1.35)
Calcium: 9.1 mg/dL (ref 8.4–10.5)
Chloride: 103 mEq/L (ref 96–112)
Glucose, Bld: 143 mg/dL — ABNORMAL HIGH (ref 70–99)
Potassium: 4 mEq/L (ref 3.5–5.3)
SODIUM: 140 meq/L (ref 135–145)
Total Bilirubin: 0.4 mg/dL (ref 0.2–1.2)
Total Protein: 6.6 g/dL (ref 6.0–8.3)

## 2015-02-27 LAB — CHCC SATELLITE - SMEAR

## 2015-02-27 LAB — IRON AND TIBC CHCC
%SAT: 8 % — AB (ref 20–55)
IRON: 31 ug/dL — AB (ref 42–163)
TIBC: 394 ug/dL (ref 202–409)
UIBC: 363 ug/dL (ref 117–376)

## 2015-02-27 LAB — FERRITIN CHCC: Ferritin: 10 ng/ml — ABNORMAL LOW (ref 22–316)

## 2015-02-27 MED ORDER — HYDROXYZINE HCL 10 MG PO TABS: 10.0000 mg | ORAL_TABLET | Freq: Three times a day (TID) | ORAL | Status: DC | PRN

## 2015-02-27 MED ORDER — BLOOD GLUCOSE METER KIT: PACK | Status: AC

## 2015-02-27 NOTE — Patient Instructions (Signed)

## 2015-02-27 NOTE — Progress Notes (Signed)
Hematology and Oncology Follow Up Visit  Chase Gutierrez [id number] Jul 24, 1951 64 y.o. 02/27/2015   Principle Diagnosis:  Polycythemia vera - JAK2 NEGATIVE  Current Therapy:   Phlebotomy to maintain hematocrit below 40% Aspirin 81 mg by mouth daily    Interim History: Chase Gutierrez is back today with his wife for a follow-up. They just returned from [place]. He is still enjoying playing in bridge tournaments.   He is feeling a little tired and has a pink rash across his chest that itches. He has had this before. We will try him on Atarax PRN and see if this helps. His Hct today is 40.9.  He can not find his accu check machine since coming home and would like a prescription for a new one.  He continues to use his CPAP machine at night and is resting well.  The swelling in his legs is has resolved with Lasix PRN. He has no tenderness numbness or tingling in his extremities. No new aches or pains.  He denies fever, chills, n/v, cough, dizziness, headache, SOB, chest pain, palpitations, abdominal pain, constipation, diarrhea, blood in urine or stool. His appetite is good and he is making sure to stay well hydrated. His weight is stable.    Medications:    Medication List       This list is accurate as of: 02/27/15 10:58 AM.  Always use your most recent med list.               ALPRAZolam 0.5 MG tablet  Commonly known as:  XANAX  TAKE TWO TABLETS TWICE DAILY AS NEEDED     amitriptyline 25 MG tablet  Commonly known as:  ELAVIL  Take 25 mg by mouth at bedtime.     ANDROGEL PUMP 20.25 MG/ACT (1.62%) Gel  Generic drug:  Testosterone     aspirin 81 MG EC tablet  Take 81 mg by mouth daily.     COLCRYS 0.6 MG tablet  Generic drug:  colchicine  Take 0.6 mg by mouth as needed.     furosemide 40 MG tablet  Commonly known as:  LASIX  Take 40 mg by mouth as needed for fluid or edema.     hydrochlorothiazide 12.5 MG tablet  Commonly known as:  HYDRODIURIL  Take 12.5 mg by  mouth daily.     ibuprofen 400 MG tablet  Commonly known as:  ADVIL,MOTRIN  Take 400 mg by mouth as needed.     lisinopril 20 MG tablet  Commonly known as:  PRINIVIL,ZESTRIL  Take 10 mg by mouth daily.     metolazone 5 MG tablet  Commonly known as:  ZAROXOLYN  Take 5 mg by mouth as needed. Take 1 hr. Before taking lasix     multivitamin capsule  Take 1 capsule by mouth daily.     naproxen 500 MG tablet  Commonly known as:  NAPROSYN  Take 250 mg by mouth as needed.     NON FORMULARY  Take by mouth every morning. VITA-PULSE     NUVIGIL 250 MG tablet  Generic drug:  Armodafinil  Take 125 mg by mouth as needed. Now taking 1/2 tab.     potassium chloride SA 20 MEQ tablet  Commonly known as:  K-DUR,KLOR-CON  Take 20 mEq by mouth once. Pt. States he takes when he uses Lasix     SYNTHROID 25 MCG tablet  Generic drug:  levothyroxine  Take 25 mcg by mouth daily.  Allergies: No Known Allergies  Past Medical History, Surgical history, Social history, and Family History were reviewed and updated.  Review of Systems: All other 10 point review of systems is negative.   Physical Exam:  weight is 258 lb (117.028 kg). His oral temperature is 97.7 F (36.5 C). His blood pressure is 112/74 and his pulse is 79. His respiration is 20.   Wt Readings from Last 3 Encounters:  02/27/15 258 lb (117.028 kg)  01/09/15 254 lb (115.214 kg)  01/08/15 257 lb (116.574 kg)    Ocular: Sclerae unicteric, pupils equal, round and reactive to light Ear-nose-throat: Oropharynx clear, dentition fair Lymphatic: No cervical or supraclavicular adenopathy Lungs no rales or rhonchi, good excursion bilaterally Heart regular rate and rhythm, no murmur appreciated Abd soft, nontender, positive bowel sounds MSK no focal spinal tenderness, no joint edema Neuro: non-focal, well-oriented, appropriate affect Breasts: Deferred  Lab Results  Component Value Date   WBC 7.6 02/27/2015   HGB 13.0  02/27/2015   HCT 40.9 02/27/2015   MCV 70* 02/27/2015   PLT 242 02/27/2015   Lab Results  Component Value Date   FERRITIN 15* 01/08/2015   IRON 37* 01/08/2015   TIBC 455* 01/08/2015   UIBC 418* 01/08/2015   IRONPCTSAT 8* 01/08/2015   Lab Results  Component Value Date   RETICCTPCT 1.8 08/08/2013   RBC 5.88* 02/27/2015   RETICCTABS 93.6 08/08/2013   No results found for: KPAFRELGTCHN, LAMBDASER, KAPLAMBRATIO No results found for: IGGSERUM, IGA, IGMSERUM No results found for: Odetta Pink, SPEI   Chemistry      Component Value Date/Time   NA 141 01/08/2015 1153   NA 141 10/14/2014 1146   NA 138 08/14/2014 1224   K 3.9 01/08/2015 1153   K 4.4 10/14/2014 1146   K 4.2 08/14/2014 1224   CL 102 10/14/2014 1146   CL 101 08/14/2014 1224   CO2 29 01/08/2015 1153   CO2 31 10/14/2014 1146   CO2 31 08/14/2014 1224   BUN 21.4 01/08/2015 1153   BUN 16 10/14/2014 1146   BUN 18 08/14/2014 1224   CREATININE 1.1 01/08/2015 1153   CREATININE 0.9 10/14/2014 1146   CREATININE 1.19 08/14/2014 1224      Component Value Date/Time   CALCIUM 9.4 01/08/2015 1153   CALCIUM 9.0 10/14/2014 1146   CALCIUM 9.0 08/14/2014 1224   ALKPHOS 93 01/08/2015 1153   ALKPHOS 93* 10/14/2014 1146   ALKPHOS 87 08/14/2014 1224   AST 29 01/08/2015 1153   AST 29 10/14/2014 1146   AST 26 08/14/2014 1224   ALT 34 01/08/2015 1153   ALT 36 10/14/2014 1146   ALT 34 08/14/2014 1224   BILITOT 0.48 01/08/2015 1153   BILITOT 0.50 10/14/2014 1146   BILITOT 0.3 08/14/2014 1224     Impression and Plan: Chase Gutierrez is 64 year old gentleman with polycythemia. He is symptomatic with fatigue and a pink rash across his chest.  His Hct today is 40.9 so we will go ahead with his phlebotomy.  He will try Atarax TID PRN and see if this helps clear up his rash.  We will also give him a prescription for a new accu check machine and test strips.  We'll plan to get him  back in 3 months for labs and follow-up when he gets back from [place].  He knows to call here with any questions or concerns. We can certainly see him back sooner if need be.  Eliezer Bottom, NP  6/30/201610:58 AM

## 2015-02-27 NOTE — Progress Notes (Signed)
Chase Gutierrez presents today for phlebotomy per MD orders. Phlebotomy procedure started at1130 and ended at 1150. 500 grams removed. Patient observed for 30 minutes after procedure without any incident. Patient tolerated procedure well. IV needle removed intact.

## 2015-02-28 ENCOUNTER — Telehealth: Payer: Self-pay | Admitting: *Deleted

## 2015-02-28 LAB — VITAMIN D 25 HYDROXY (VIT D DEFICIENCY, FRACTURES): VIT D 25 HYDROXY: 25 ng/mL — AB (ref 30–100)

## 2015-03-06 ENCOUNTER — Other Ambulatory Visit: Payer: Self-pay | Admitting: Internal Medicine

## 2015-03-06 ENCOUNTER — Other Ambulatory Visit: Payer: Self-pay | Admitting: Hematology & Oncology

## 2015-04-08 ENCOUNTER — Other Ambulatory Visit: Payer: Self-pay | Admitting: Hematology & Oncology

## 2015-04-10 ENCOUNTER — Other Ambulatory Visit: Payer: Self-pay | Admitting: *Deleted

## 2015-04-10 DIAGNOSIS — D45 Polycythemia vera: Secondary | ICD-10-CM

## 2015-04-10 MED ORDER — ALPRAZOLAM 0.5 MG PO TABS: 0.5000 mg | ORAL_TABLET | Freq: Two times a day (BID) | ORAL | Status: DC | PRN

## 2015-05-23 ENCOUNTER — Other Ambulatory Visit: Payer: 59

## 2015-05-23 ENCOUNTER — Ambulatory Visit: Payer: 59 | Admitting: Hematology & Oncology

## 2015-06-03 NOTE — Progress Notes (Signed)
Patient needs a follow up CT of chest to look at aorta again and to look at smal lung nodule on previous CT

## 2015-06-04 ENCOUNTER — Ambulatory Visit (HOSPITAL_BASED_OUTPATIENT_CLINIC_OR_DEPARTMENT_OTHER): Payer: 59

## 2015-06-04 ENCOUNTER — Other Ambulatory Visit (HOSPITAL_BASED_OUTPATIENT_CLINIC_OR_DEPARTMENT_OTHER): Payer: 59

## 2015-06-04 ENCOUNTER — Encounter: Payer: Self-pay | Admitting: Family

## 2015-06-04 ENCOUNTER — Ambulatory Visit (HOSPITAL_BASED_OUTPATIENT_CLINIC_OR_DEPARTMENT_OTHER): Payer: 59 | Admitting: Family

## 2015-06-04 VITALS — BP 129/91 | HR 77

## 2015-06-04 VITALS — BP 139/93 | HR 72 | Temp 97.9°F | Resp 16 | Ht 68.0 in | Wt 265.0 lb

## 2015-06-04 DIAGNOSIS — R5383 Other fatigue: Secondary | ICD-10-CM | POA: Diagnosis not present

## 2015-06-04 DIAGNOSIS — I509 Heart failure, unspecified: Secondary | ICD-10-CM | POA: Insufficient documentation

## 2015-06-04 DIAGNOSIS — D45 Polycythemia vera: Secondary | ICD-10-CM

## 2015-06-04 DIAGNOSIS — G4730 Sleep apnea, unspecified: Secondary | ICD-10-CM | POA: Insufficient documentation

## 2015-06-04 DIAGNOSIS — Z23 Encounter for immunization: Secondary | ICD-10-CM

## 2015-06-04 DIAGNOSIS — E782 Mixed hyperlipidemia: Secondary | ICD-10-CM | POA: Insufficient documentation

## 2015-06-04 DIAGNOSIS — I10 Essential (primary) hypertension: Secondary | ICD-10-CM | POA: Insufficient documentation

## 2015-06-04 LAB — FERRITIN CHCC: FERRITIN: 18 ng/mL — AB (ref 22–316)

## 2015-06-04 LAB — COMPREHENSIVE METABOLIC PANEL (CC13)
ALBUMIN: 3.8 g/dL (ref 3.5–5.0)
ALK PHOS: 102 U/L (ref 40–150)
ALT: 58 U/L — AB (ref 0–55)
ANION GAP: 10 meq/L (ref 3–11)
AST: 39 U/L — ABNORMAL HIGH (ref 5–34)
BILIRUBIN TOTAL: 0.38 mg/dL (ref 0.20–1.20)
BUN: 19.7 mg/dL (ref 7.0–26.0)
CO2: 23 meq/L (ref 22–29)
CREATININE: 1.1 mg/dL (ref 0.7–1.3)
Calcium: 9.1 mg/dL (ref 8.4–10.4)
Chloride: 104 mEq/L (ref 98–109)
EGFR: 71 mL/min/{1.73_m2} — ABNORMAL LOW (ref >90)
GLUCOSE: 241 mg/dL — AB (ref 70–140)
Potassium: 3.8 mEq/L (ref 3.5–5.1)
Sodium: 138 mEq/L (ref 136–145)
TOTAL PROTEIN: 6.9 g/dL (ref 6.4–8.3)

## 2015-06-04 LAB — CBC WITH DIFFERENTIAL (CANCER CENTER ONLY)
BASO#: 0.1 10*3/uL (ref 0.0–0.2)
BASO%: 2 % (ref 0.0–2.0)
EOS ABS: 0.4 10*3/uL (ref 0.0–0.5)
EOS%: 5 % (ref 0.0–7.0)
HCT: 42.9 % (ref 38.7–49.9)
HEMOGLOBIN: 14.1 g/dL (ref 13.0–17.1)
LYMPH#: 1.5 10*3/uL (ref 0.9–3.3)
LYMPH%: 21.1 % (ref 14.0–48.0)
MCH: 23 pg — AB (ref 28.0–33.4)
MCHC: 32.9 g/dL (ref 32.0–35.9)
MCV: 70 fL — AB (ref 82–98)
MONO#: 0.5 10*3/uL (ref 0.1–0.9)
MONO%: 7.5 % (ref 0.0–13.0)
NEUT%: 64.4 % (ref 40.0–80.0)
NEUTROS ABS: 4.5 10*3/uL (ref 1.5–6.5)
Platelets: 200 10*3/uL (ref 145–400)
RBC: 6.13 10*6/uL — AB (ref 4.20–5.70)
RDW: 19.9 % — ABNORMAL HIGH (ref 11.1–15.7)
WBC: 7 10*3/uL (ref 4.0–10.0)

## 2015-06-04 LAB — IRON AND TIBC CHCC
%SAT: 11 % — AB (ref 20–55)
Iron: 44 ug/dL (ref 42–163)
TIBC: 410 ug/dL — ABNORMAL HIGH (ref 202–409)
UIBC: 367 ug/dL (ref 117–376)

## 2015-06-04 LAB — CHCC SATELLITE - SMEAR

## 2015-06-04 LAB — HEMOGLOBIN A1C
Hgb A1c MFr Bld: 8.6 % — ABNORMAL HIGH (ref <5.7)
MEAN PLASMA GLUCOSE: 200 mg/dL — AB (ref <117)

## 2015-06-04 MED ORDER — INFLUENZA VAC SPLIT QUAD 0.5 ML IM SUSY
0.5000 mL | PREFILLED_SYRINGE | Freq: Once | INTRAMUSCULAR | Status: AC
  Administered 2015-06-04: 0.5 mL via INTRAMUSCULAR
  Filled 2015-06-04: qty 0.5

## 2015-06-04 NOTE — Patient Instructions (Signed)
Therapeutic Phlebotomy, Care After  Refer to this sheet in the next few weeks. These instructions provide you with information about caring for yourself after your procedure. Your health care provider may also give you more specific instructions. Your treatment has been planned according to current medical practices, but problems sometimes occur. Call your health care provider if you have any problems or questions after your procedure.  WHAT TO EXPECT AFTER THE PROCEDURE  After your procedure, it is common to have:   Light-headedness or dizziness. You may feel faint.   Nausea.   Tiredness.  HOME CARE INSTRUCTIONS  Activities   Return to your normal activities as directed by your health care provider. Most people can go back to their normal activities right away.   Avoid strenuous physical activity and heavy lifting or pulling for about 5 hours after the procedure. Do not lift anything that is heavier than 10 lb (4.5 kg).   Athletes should avoid strenuous exercise for at least 12 hours.   Change positions slowly for the remainder of the day. This will help to prevent light-headedness or fainting.   If you feel light-headed, lie down until the feeling goes away.  Eating and Drinking   Be sure to eat well-balanced meals for the next 24 hours.   Drink enough fluid to keep your urine clear or pale yellow.   Avoid drinking alcohol on the day that you had the procedure.  Care of the Needle Insertion Site   Keep your bandage dry. You can remove the bandage after about 5 hours or as directed by your health care provider.   If you have bleeding from the needle insertion site, elevate your arm and press firmly on the site until the bleeding stops.   If you have bruising at the site, apply ice to the area:   Put ice in a plastic bag.   Place a towel between your skin and the bag.   Leave the ice on for 20 minutes, 2-3 times a day for the first 24 hours.   If the swelling does not go away after 24 hours, apply  a warm, moist washcloth to the area for 20 minutes, 2-3 times a day.  General Instructions   Avoid smoking for at least 30 minutes after the procedure.   Keep all follow-up visits as directed by your health care provider. It is important to continue with further therapeutic phlebotomy treatments as directed.  SEEK MEDICAL CARE IF:   You have redness, swelling, or pain at the needle insertion site.   You have fluid, blood, or pus coming from the needle insertion site.   You feel light-headed, dizzy, or nauseated, and the feeling does not go away.   You notice new bruising at the needle insertion site.   You feel weaker than normal.   You have a fever or chills.  SEEK IMMEDIATE MEDICAL CARE IF:   You have severe nausea or vomiting.   You have chest pain.   You have trouble breathing.    This information is not intended to replace advice given to you by your health care provider. Make sure you discuss any questions you have with your health care provider.    Document Released: 01/18/2011 Document Revised: 12/31/2014 Document Reviewed: 08/12/2014  Elsevier Interactive Patient Education 2016 [employer]

## 2015-06-04 NOTE — Progress Notes (Signed)
Hematology and Oncology Follow Up Visit  EUNICE OLDAKER [id number] 1951/07/25 64 y.o. 06/04/2015   Principle Diagnosis:  Polycythemia vera - JAK2 NEGATIVE  Current Therapy:   Phlebotomy to maintain hematocrit below 40% Aspirin 81 mg by mouth daily    Interim History: Mr. Sevigny is back today for a follow-up. He is feeling "itchy, fatigued and has bloody gums when he brushes his teeth." His complexion is "ruddy" today.  His last phlebotomy was in June. He did not have one while in [place] this last time. His Hct is 42.9 so we will phlebotomize him today.  He denies fever, chills, n/v, cough, dizziness, headache, SOB, chest pain, palpitations, abdominal pain or changes in bowel or bladder habits.  No swelling, tenderness, numbness or tingling in his extremities. No joint or "boney" pain.  He states that he has not been eating like he should and does not always check his blood sugar. He did get a new machine and states that when he does check his blood sugar runs 120-160. He plans to get back on a healthy diet and lose some weight. He is staying hydrated.  He has another bridge tournament coming up in [facility] and he is really looking forward to this.   Medications:    Medication List       This list is accurate as of: 06/04/15 10:51 AM.  Always use your most recent med list.               ALPRAZolam 0.5 MG tablet  Commonly known as:  XANAX  Take 1 tablet (0.5 mg total) by mouth 2 (two) times daily as needed for anxiety.     amitriptyline 25 MG tablet  Commonly known as:  ELAVIL  Take 25 mg by mouth at bedtime.     ANDROGEL PUMP 20.25 MG/ACT (1.62%) Gel  Generic drug:  Testosterone     aspirin 81 MG EC tablet  Take 81 mg by mouth daily.     blood glucose meter kit and supplies  Dispense based on patient and insurance preference. Use up to four times daily as directed. (FOR ICD-9 250.00, 250.01).     COLCRYS 0.6 MG tablet  Generic drug:  colchicine  Take 0.6 mg by mouth  as needed.     furosemide 40 MG tablet  Commonly known as:  LASIX  TAKE ONE TABLET EVERY DAY     hydrochlorothiazide 12.5 MG tablet  Commonly known as:  HYDRODIURIL  Take 12.5 mg by mouth daily.     hydrOXYzine 10 MG tablet  Commonly known as:  ATARAX/VISTARIL  Take 1 tablet (10 mg total) by mouth 3 (three) times daily as needed for itching.     ibuprofen 400 MG tablet  Commonly known as:  ADVIL,MOTRIN  Take 400 mg by mouth as needed.     lisinopril 20 MG tablet  Commonly known as:  PRINIVIL,ZESTRIL  Take 10 mg by mouth daily.     metolazone 5 MG tablet  Commonly known as:  ZAROXOLYN  Take 5 mg by mouth as needed. Take 1 hr. Before taking lasix     multivitamin capsule  Take 1 capsule by mouth daily.     naproxen 500 MG tablet  Commonly known as:  NAPROSYN  Take 250 mg by mouth as needed.     NON FORMULARY  Take by mouth every morning. VITA-PULSE     NUVIGIL 250 MG tablet  Generic drug:  Armodafinil  Take 125 mg by mouth as  needed. Now taking 1/2 tab.     ONE TOUCH ULTRA TEST test strip  Generic drug:  glucose blood     ONETOUCH DELICA LANCETS FINE Misc     potassium chloride SA 20 MEQ tablet  Commonly known as:  K-DUR,KLOR-CON  Take 20 mEq by mouth once. Pt. States he takes when he uses Lasix     SYNTHROID 25 MCG tablet  Generic drug:  levothyroxine  Take 25 mcg by mouth daily.        Allergies: No Known Allergies  Past Medical History, Surgical history, Social history, and Family History were reviewed and updated.  Review of Systems: All other 10 point review of systems is negative.   Physical Exam:  height is $RemoveB'5\' 8"'mAjtzReB$  (1.727 m) and weight is 265 lb (120.203 kg). His oral temperature is 97.9 F (36.6 C). His blood pressure is 139/93 and his pulse is 72. His respiration is 16.   Wt Readings from Last 3 Encounters:  06/04/15 265 lb (120.203 kg)  02/27/15 258 lb (117.028 kg)  01/09/15 254 lb (115.214 kg)    Ocular: Sclerae unicteric, pupils equal,  round and reactive to light Ear-nose-throat: Oropharynx clear, dentition fair Lymphatic: No cervical or supraclavicular adenopathy Lungs no rales or rhonchi, good excursion bilaterally Heart regular rate and rhythm, no murmur appreciated Abd soft, nontender, positive bowel sounds MSK no focal spinal tenderness, no joint edema Neuro: non-focal, well-oriented, appropriate affect Breasts: Deferred  Lab Results  Component Value Date   WBC 7.0 06/04/2015   HGB 14.1 06/04/2015   HCT 42.9 06/04/2015   MCV 70* 06/04/2015   PLT 200 06/04/2015   Lab Results  Component Value Date   FERRITIN 10* 02/27/2015   IRON 31* 02/27/2015   TIBC 394 02/27/2015   UIBC 363 02/27/2015   IRONPCTSAT 8* 02/27/2015   Lab Results  Component Value Date   RETICCTPCT 1.8 08/08/2013   RBC 6.13* 06/04/2015   RETICCTABS 93.6 08/08/2013   No results found for: KPAFRELGTCHN, LAMBDASER, KAPLAMBRATIO No results found for: IGGSERUM, IGA, IGMSERUM No results found for: Odetta Pink, SPEI   Chemistry      Component Value Date/Time   NA 140 02/27/2015 1020   NA 141 01/08/2015 1153   NA 141 10/14/2014 1146   K 4.0 02/27/2015 1020   K 3.9 01/08/2015 1153   K 4.4 10/14/2014 1146   CL 103 02/27/2015 1020   CL 102 10/14/2014 1146   CO2 24 02/27/2015 1020   CO2 29 01/08/2015 1153   CO2 31 10/14/2014 1146   BUN 20 02/27/2015 1020   BUN 21.4 01/08/2015 1153   BUN 16 10/14/2014 1146   CREATININE 1.03 02/27/2015 1020   CREATININE 1.1 01/08/2015 1153   CREATININE 0.9 10/14/2014 1146      Component Value Date/Time   CALCIUM 9.1 02/27/2015 1020   CALCIUM 9.4 01/08/2015 1153   CALCIUM 9.0 10/14/2014 1146   ALKPHOS 84 02/27/2015 1020   ALKPHOS 93 01/08/2015 1153   ALKPHOS 93* 10/14/2014 1146   AST 21 02/27/2015 1020   AST 29 01/08/2015 1153   AST 29 10/14/2014 1146   ALT 28 02/27/2015 1020   ALT 34 01/08/2015 1153   ALT 36 10/14/2014 1146   BILITOT 0.4  02/27/2015 1020   BILITOT 0.48 01/08/2015 1153   BILITOT 0.50 10/14/2014 1146     Impression and Plan: Mr. Nester is 64 year old gentleman with polycythemia. He is symptomatic today with fatigue, itching and bleeding  gums.  His Hct today is 42.9 so we will do a phlebotomy.  He also asked to add a Hgb A1c to his labs so we have done this.  We will plan to see him back in 1 month for labs, follow-up and phlebotomy.  He knows to call here with any questions or concerns. We can certainly see him back sooner if need be.  Eliezer Bottom, NP 10/5/201610:51 AM

## 2015-06-05 LAB — VITAMIN D 25 HYDROXY (VIT D DEFICIENCY, FRACTURES): VIT D 25 HYDROXY: 24 ng/mL — AB (ref 30–100)

## 2015-06-12 ENCOUNTER — Other Ambulatory Visit: Payer: Self-pay | Admitting: *Deleted

## 2015-06-12 ENCOUNTER — Other Ambulatory Visit: Payer: Self-pay | Admitting: Internal Medicine

## 2015-06-12 DIAGNOSIS — I712 Thoracic aortic aneurysm, without rupture, unspecified: Secondary | ICD-10-CM

## 2015-06-12 NOTE — Progress Notes (Signed)
Spoke with patient. He said Dr. Raul Del office is working on scheduling his CT scan for in the next week or two. He would like to know if/when he should see Dr. Harrington Challenger again.   He was last seen in May 2016.

## 2015-06-18 ENCOUNTER — Ambulatory Visit
Admission: RE | Admit: 2015-06-18 | Discharge: 2015-06-18 | Disposition: A | Discharge status: Home / Self Care | Payer: 59 | Source: Ambulatory Visit | Attending: Internal Medicine | Admitting: Internal Medicine

## 2015-06-18 DIAGNOSIS — I712 Thoracic aortic aneurysm, without rupture, unspecified: Secondary | ICD-10-CM

## 2015-06-18 MED ORDER — IOPAMIDOL (ISOVUE-300) INJECTION 61%
75.0000 mL | Freq: Once | INTRAVENOUS | Status: AC | PRN
  Administered 2015-06-18: 75 mL via INTRAVENOUS

## 2015-07-01 ENCOUNTER — Encounter: Payer: Self-pay | Admitting: Family

## 2015-07-01 ENCOUNTER — Ambulatory Visit (HOSPITAL_BASED_OUTPATIENT_CLINIC_OR_DEPARTMENT_OTHER): Payer: 59 | Admitting: Family

## 2015-07-01 ENCOUNTER — Other Ambulatory Visit (HOSPITAL_BASED_OUTPATIENT_CLINIC_OR_DEPARTMENT_OTHER): Payer: 59

## 2015-07-01 ENCOUNTER — Ambulatory Visit (HOSPITAL_BASED_OUTPATIENT_CLINIC_OR_DEPARTMENT_OTHER): Payer: 59

## 2015-07-01 VITALS — BP 142/91 | HR 78 | Temp 98.4°F | Resp 16 | Ht 68.0 in | Wt 265.0 lb

## 2015-07-01 VITALS — BP 124/97 | HR 79 | Temp 98.2°F | Resp 18

## 2015-07-01 DIAGNOSIS — D45 Polycythemia vera: Secondary | ICD-10-CM

## 2015-07-01 DIAGNOSIS — M109 Gout, unspecified: Secondary | ICD-10-CM

## 2015-07-01 LAB — CBC WITH DIFFERENTIAL (CANCER CENTER ONLY)
BASO#: 0.2 10e3/uL (ref 0.0–0.2)
BASO%: 2.5 % — ABNORMAL HIGH (ref 0.0–2.0)
EOS%: 4.8 % (ref 0.0–7.0)
Eosinophils Absolute: 0.3 10e3/uL (ref 0.0–0.5)
HCT: 42.8 % (ref 38.7–49.9)
HGB: 13.6 g/dL (ref 13.0–17.1)
LYMPH#: 1.6 10e3/uL (ref 0.9–3.3)
LYMPH%: 23.2 % (ref 14.0–48.0)
MCH: 22.7 pg — ABNORMAL LOW (ref 28.0–33.4)
MCHC: 31.8 g/dL — ABNORMAL LOW (ref 32.0–35.9)
MCV: 72 fL — ABNORMAL LOW (ref 82–98)
MONO#: 0.6 10e3/uL (ref 0.1–0.9)
MONO%: 9.3 % (ref 0.0–13.0)
NEUT#: 4 10e3/uL (ref 1.5–6.5)
NEUT%: 60.2 % (ref 40.0–80.0)
Platelets: 196 10e3/uL (ref 145–400)
RBC: 5.98 10e6/uL — ABNORMAL HIGH (ref 4.20–5.70)
RDW: 18.8 % — ABNORMAL HIGH (ref 11.1–15.7)
WBC: 6.7 10e3/uL (ref 4.0–10.0)

## 2015-07-01 LAB — IRON AND TIBC CHCC
%SAT: 8 % — ABNORMAL LOW (ref 20–55)
IRON: 38 ug/dL — AB (ref 42–163)
TIBC: 455 ug/dL — AB (ref 202–409)
UIBC: 417 ug/dL — ABNORMAL HIGH (ref 117–376)

## 2015-07-01 LAB — COMPREHENSIVE METABOLIC PANEL (CC13)
ALBUMIN: 4 g/dL (ref 3.5–5.0)
ALT: 72 U/L — ABNORMAL HIGH (ref 0–55)
ANION GAP: 10 meq/L (ref 3–11)
AST: 56 U/L — ABNORMAL HIGH (ref 5–34)
Alkaline Phosphatase: 100 U/L (ref 40–150)
BUN: 19 mg/dL (ref 7.0–26.0)
CHLORIDE: 104 meq/L (ref 98–109)
CO2: 28 meq/L (ref 22–29)
CREATININE: 1.1 mg/dL (ref 0.7–1.3)
Calcium: 9.7 mg/dL (ref 8.4–10.4)
EGFR: 71 mL/min/{1.73_m2} — ABNORMAL LOW (ref >90)
Glucose: 185 mg/dl — ABNORMAL HIGH (ref 70–140)
Potassium: 4.2 mEq/L (ref 3.5–5.1)
SODIUM: 141 meq/L (ref 136–145)
TOTAL PROTEIN: 7.3 g/dL (ref 6.4–8.3)
Total Bilirubin: 0.43 mg/dL (ref 0.20–1.20)

## 2015-07-01 LAB — CHCC SATELLITE - SMEAR

## 2015-07-01 LAB — FERRITIN CHCC: Ferritin: 27 ng/ml (ref 22–316)

## 2015-07-01 MED ORDER — COLCHICINE 0.6 MG PO TABS: 0.6000 mg | ORAL_TABLET | ORAL | Status: AC | PRN

## 2015-07-01 NOTE — Patient Instructions (Signed)
Therapeutic Phlebotomy, Care After  Refer to this sheet in the next few weeks. These instructions provide you with information about caring for yourself after your procedure. Your health care provider may also give you more specific instructions. Your treatment has been planned according to current medical practices, but problems sometimes occur. Call your health care provider if you have any problems or questions after your procedure.  WHAT TO EXPECT AFTER THE PROCEDURE  After your procedure, it is common to have:   Light-headedness or dizziness. You may feel faint.   Nausea.   Tiredness.  HOME CARE INSTRUCTIONS  Activities   Return to your normal activities as directed by your health care provider. Most people can go back to their normal activities right away.   Avoid strenuous physical activity and heavy lifting or pulling for about 5 hours after the procedure. Do not lift anything that is heavier than 10 lb (4.5 kg).   Athletes should avoid strenuous exercise for at least 12 hours.   Change positions slowly for the remainder of the day. This will help to prevent light-headedness or fainting.   If you feel light-headed, lie down until the feeling goes away.  Eating and Drinking   Be sure to eat well-balanced meals for the next 24 hours.   Drink enough fluid to keep your urine clear or pale yellow.   Avoid drinking alcohol on the day that you had the procedure.  Care of the Needle Insertion Site   Keep your bandage dry. You can remove the bandage after about 5 hours or as directed by your health care provider.   If you have bleeding from the needle insertion site, elevate your arm and press firmly on the site until the bleeding stops.   If you have bruising at the site, apply ice to the area:   Put ice in a plastic bag.   Place a towel between your skin and the bag.   Leave the ice on for 20 minutes, 2-3 times a day for the first 24 hours.   If the swelling does not go away after 24 hours, apply  a warm, moist washcloth to the area for 20 minutes, 2-3 times a day.  General Instructions   Avoid smoking for at least 30 minutes after the procedure.   Keep all follow-up visits as directed by your health care provider. It is important to continue with further therapeutic phlebotomy treatments as directed.  SEEK MEDICAL CARE IF:   You have redness, swelling, or pain at the needle insertion site.   You have fluid, blood, or pus coming from the needle insertion site.   You feel light-headed, dizzy, or nauseated, and the feeling does not go away.   You notice new bruising at the needle insertion site.   You feel weaker than normal.   You have a fever or chills.  SEEK IMMEDIATE MEDICAL CARE IF:   You have severe nausea or vomiting.   You have chest pain.   You have trouble breathing.    This information is not intended to replace advice given to you by your health care provider. Make sure you discuss any questions you have with your health care provider.    Document Released: 01/18/2011 Document Revised: 12/31/2014 Document Reviewed: 08/12/2014  Elsevier Interactive Patient Education 2016 [employer]

## 2015-07-01 NOTE — Progress Notes (Signed)
Hematology and Oncology Follow Up Visit  DRADEN COTTINGHAM [id number] Apr 16, 1951 64 y.o. 07/01/2015   Principle Diagnosis:  Polycythemia vera - JAK2 NEGATIVE  Current Therapy:   Phlebotomy to maintain hematocrit below 40% Aspirin 81 mg by mouth daily    Interim History: Mr. Ruhlman is back today for a follow-up. He just returned from another [employer] where his ream did quite well.  His Hct today is 42.8 so we will do a phlebotomy today.  He denies fever, chills, n/v, cough, dizziness, headache, SOB, chest pain, palpitations, abdominal pain or changes in bowel or bladder habits.  He has gout in his right foot at this time. His colchicine is in [place] so we will get him a prescription for some while he is here. No other swelling or tenderness, in his extremities. No c/o joint pain.  He is now on Trulicity for his diabetes and continues to monitor his blood sugars closely. He is eating healthy and staying well hydrated. His weight is unchanged.    Medications:    Medication List       This list is accurate as of: 07/01/15 11:04 AM.  Always use your most recent med list.               ALPRAZolam 0.5 MG tablet  Commonly known as:  XANAX  Take 1 tablet (0.5 mg total) by mouth 2 (two) times daily as needed for anxiety.     amitriptyline 25 MG tablet  Commonly known as:  ELAVIL  Take 25 mg by mouth at bedtime.     aspirin 81 MG EC tablet  Take 81 mg by mouth daily.     blood glucose meter kit and supplies  Dispense based on patient and insurance preference. Use up to four times daily as directed. (FOR ICD-9 250.00, 250.01).     colchicine 0.6 MG tablet  Commonly known as:  COLCRYS  Take 1 tablet (0.6 mg total) by mouth as needed.     furosemide 40 MG tablet  Commonly known as:  LASIX  TAKE ONE TABLET EVERY DAY     hydrochlorothiazide 12.5 MG tablet  Commonly known as:  HYDRODIURIL  Take 12.5 mg by mouth daily.     hydrOXYzine 10 MG tablet  Commonly known  as:  ATARAX/VISTARIL  Take 1 tablet (10 mg total) by mouth 3 (three) times daily as needed for itching.     ibuprofen 400 MG tablet  Commonly known as:  ADVIL,MOTRIN  Take 400 mg by mouth as needed.     lisinopril 20 MG tablet  Commonly known as:  PRINIVIL,ZESTRIL  Take 10 mg by mouth daily.     metolazone 5 MG tablet  Commonly known as:  ZAROXOLYN  Take 5 mg by mouth as needed. Take 1 hr. Before taking lasix     multivitamin capsule  Take 1 capsule by mouth daily.     naproxen 500 MG tablet  Commonly known as:  NAPROSYN  Take 250 mg by mouth as needed.     NON FORMULARY  Take by mouth every morning. VITA-PULSE     NUVIGIL 250 MG tablet  Generic drug:  Armodafinil  Take 125 mg by mouth as needed. Now taking 1/2 tab.     ONE TOUCH ULTRA TEST test strip  Generic drug:  glucose blood     ONETOUCH DELICA LANCETS FINE Misc     potassium chloride SA 20 MEQ tablet  Commonly known as:  K-DUR,KLOR-CON  Take 20  mEq by mouth once. Pt. States he takes when he uses Lasix     SYNTHROID 25 MCG tablet  Generic drug:  levothyroxine  Take 25 mcg by mouth daily.     TRULICITY 3.01 SW/1.0XN Sopn  Generic drug:  Dulaglutide  Inject 0.75 mg into the skin.        Allergies: No Known Allergies  Past Medical History, Surgical history, Social history, and Family History were reviewed and updated.  Review of Systems: All other 10 point review of systems is negative.   Physical Exam:  height is _0  (1.727 m) and weight is 265 lb (120.203 kg). His oral temperature is 98.4 F (36.9 C). His blood pressure is 142/91 and his pulse is 78. His respiration is 16.   Wt Readings from Last 3 Encounters:  07/01/15 265 lb (120.203 kg)  06/04/15 265 lb (120.203 kg)  02/27/15 258 lb (117.028 kg)    Ocular: Sclerae unicteric, pupils equal, round and reactive to light Ear-nose-throat: Oropharynx clear, dentition fair Lymphatic: No cervical or supraclavicular adenopathy Lungs no rales or  rhonchi, good excursion bilaterally Heart regular rate and rhythm, no murmur appreciated Abd soft, nontender, positive bowel sounds MSK no focal spinal tenderness, no joint edema Neuro: non-focal, well-oriented, appropriate affect Breasts: Deferred  Lab Results  Component Value Date   WBC 6.7 07/01/2015   HGB 13.6 07/01/2015   HCT 42.8 07/01/2015   MCV 72* 07/01/2015   PLT 196 07/01/2015   Lab Results  Component Value Date   FERRITIN 18* 06/04/2015   IRON 44 06/04/2015   TIBC 410* 06/04/2015   UIBC 367 06/04/2015   IRONPCTSAT 11* 06/04/2015   Lab Results  Component Value Date   RETICCTPCT 1.8 08/08/2013   RBC 5.98* 07/01/2015   RETICCTABS 93.6 08/08/2013   No results found for: KPAFRELGTCHN, LAMBDASER, KAPLAMBRATIO No results found for: Kandis Cocking, IGMSERUM No results found for: Odetta Pink, SPEI   Chemistry      Component Value Date/Time   NA 138 06/04/2015 0943   NA 140 02/27/2015 1020   NA 141 10/14/2014 1146   K 3.8 06/04/2015 0943   K 4.0 02/27/2015 1020   K 4.4 10/14/2014 1146   CL 103 02/27/2015 1020   CL 102 10/14/2014 1146   CO2 23 06/04/2015 0943   CO2 24 02/27/2015 1020   CO2 31 10/14/2014 1146   BUN 19.7 06/04/2015 0943   BUN 20 02/27/2015 1020   BUN 16 10/14/2014 1146   CREATININE 1.1 06/04/2015 0943   CREATININE 1.03 02/27/2015 1020   CREATININE 0.9 10/14/2014 1146      Component Value Date/Time   CALCIUM 9.1 06/04/2015 0943   CALCIUM 9.1 02/27/2015 1020   CALCIUM 9.0 10/14/2014 1146   ALKPHOS 102 06/04/2015 0943   ALKPHOS 84 02/27/2015 1020   ALKPHOS 93* 10/14/2014 1146   AST 39* 06/04/2015 0943   AST 21 02/27/2015 1020   AST 29 10/14/2014 1146   ALT 58* 06/04/2015 0943   ALT 28 02/27/2015 1020   ALT 36 10/14/2014 1146   BILITOT 0.38 06/04/2015 0943   BILITOT 0.4 02/27/2015 1020   BILITOT 0.50 10/14/2014 1146     Impression and Plan: Mr. Skelly is 64 year old gentleman  with polycythemia. He is asymptomatic at this time.  His Hct today is 42.8 so we will do a phlebotomy today.  We will plan to see him back in 1 month for labs, follow-up and phlebotomy if needed.  He knows to call here with any questions or concerns. We can certainly see him back sooner if need be.  Eliezer Bottom, NP 11/1/201611:04 AM

## 2015-07-01 NOTE — Progress Notes (Signed)
Chase Gutierrez presents today for phlebotomy per MD orders. Phlebotomy procedure started at 1025 and ended at 1030. Approximately 500 mls removed. Patient observed for 30 minutes after procedure without any incident. Patient tolerated procedure well. IV needle removed intact.

## 2015-07-02 ENCOUNTER — Other Ambulatory Visit: Payer: Self-pay | Admitting: Family

## 2015-07-10 ENCOUNTER — Encounter: Payer: Self-pay | Admitting: Internal Medicine

## 2015-08-05 ENCOUNTER — Other Ambulatory Visit: Payer: 59

## 2015-08-05 ENCOUNTER — Ambulatory Visit: Payer: 59 | Admitting: Family

## 2015-08-07 ENCOUNTER — Encounter: Payer: Self-pay | Admitting: Family

## 2015-08-07 ENCOUNTER — Ambulatory Visit (HOSPITAL_BASED_OUTPATIENT_CLINIC_OR_DEPARTMENT_OTHER): Payer: 59

## 2015-08-07 ENCOUNTER — Other Ambulatory Visit (HOSPITAL_BASED_OUTPATIENT_CLINIC_OR_DEPARTMENT_OTHER): Payer: 59

## 2015-08-07 ENCOUNTER — Ambulatory Visit (HOSPITAL_BASED_OUTPATIENT_CLINIC_OR_DEPARTMENT_OTHER): Payer: 59 | Admitting: Family

## 2015-08-07 VITALS — BP 121/73 | HR 86 | Temp 98.1°F | Resp 18 | Ht 68.0 in | Wt 255.0 lb

## 2015-08-07 VITALS — BP 98/60 | HR 91

## 2015-08-07 DIAGNOSIS — D45 Polycythemia vera: Secondary | ICD-10-CM | POA: Diagnosis not present

## 2015-08-07 LAB — COMPREHENSIVE METABOLIC PANEL (CC13)
ALBUMIN: 4.3 g/dL (ref 3.6–5.1)
ALT: 52 U/L — AB (ref 9–46)
AST: 33 U/L (ref 10–35)
Alkaline Phosphatase: 88 U/L (ref 40–115)
BILIRUBIN TOTAL: 0.3 mg/dL (ref 0.2–1.2)
BUN: 18 mg/dL (ref 7–25)
CO2: 29 mmol/L (ref 20–31)
CREATININE: 1.28 mg/dL — AB (ref 0.70–1.25)
Calcium: 9.9 mg/dL (ref 8.6–10.3)
Chloride: 99 mmol/L (ref 98–110)
Glucose, Bld: 86 mg/dL (ref 65–99)
Potassium: 4 mmol/L (ref 3.5–5.3)
SODIUM: 143 mmol/L (ref 135–146)
TOTAL PROTEIN: 7.3 g/dL (ref 6.1–8.1)

## 2015-08-07 LAB — CBC WITH DIFFERENTIAL (CANCER CENTER ONLY)
BASO#: 0.2 10*3/uL (ref 0.0–0.2)
BASO%: 1.9 % (ref 0.0–2.0)
EOS%: 3.7 % (ref 0.0–7.0)
Eosinophils Absolute: 0.4 10*3/uL (ref 0.0–0.5)
HEMATOCRIT: 43.6 % (ref 38.7–49.9)
HEMOGLOBIN: 13.5 g/dL (ref 13.0–17.1)
LYMPH#: 2 10*3/uL (ref 0.9–3.3)
LYMPH%: 18.5 % (ref 14.0–48.0)
MCH: 22.1 pg — ABNORMAL LOW (ref 28.0–33.4)
MCHC: 31 g/dL — ABNORMAL LOW (ref 32.0–35.9)
MCV: 71 fL — ABNORMAL LOW (ref 82–98)
MONO#: 0.9 10*3/uL (ref 0.1–0.9)
MONO%: 8 % (ref 0.0–13.0)
NEUT%: 67.9 % (ref 40.0–80.0)
NEUTROS ABS: 7.3 10*3/uL — AB (ref 1.5–6.5)
Platelets: 293 10*3/uL (ref 145–400)
RBC: 6.11 10*6/uL — AB (ref 4.20–5.70)
RDW: 16.6 % — ABNORMAL HIGH (ref 11.1–15.7)
WBC: 10.7 10*3/uL — ABNORMAL HIGH (ref 4.0–10.0)

## 2015-08-07 LAB — CHCC SATELLITE - SMEAR

## 2015-08-07 NOTE — Progress Notes (Signed)
Chase Gutierrez presents today for phlebotomy per MD orders. Phlebotomy procedure started at 1447 and ended at 1314. 500 ml removed. Patient observed for 30 minutes after procedure without any incident. Patient tolerated procedure well. IV needle removed intact.

## 2015-08-07 NOTE — Progress Notes (Signed)
Hematology and Oncology Follow Up Visit  Chase Gutierrez [id number] 09-Jun-1951 64 y.o. 08/07/2015   Principle Diagnosis:  Polycythemia vera - JAK2 NEGATIVE  Current Therapy:   Phlebotomy to maintain hematocrit below 40% Aspirin 81 mg by mouth daily    Interim History: Chase Gutierrez is back today with his wife for a follow-up. He is feeling fatigued and suspects that it is time for a phlebotomy. He has also noticed vision is a bit blurry. He plan to make an appointment with his eye doctor soon.  His vital signs are stable. His blood sugars have been better controlled on Trulicity. His fast blood sugar in the mornings is in the 120's. He is also losing weight on this. His weight is down 10 lbs since his last visit.  He is eating healthy and staying well hydrated.  He denies fever, chills, rash, itching, n/v, cough, dizziness, headache, SOB, chest pain, palpitations, abdominal pain or changes in bowel or bladder habits.  No other swelling or tenderness, in his extremities. No c/o joint pain.   Medications:    Medication List       This list is accurate as of: 08/07/15  2:20 PM.  Always use your most recent med list.               ALPRAZolam 0.5 MG tablet  Commonly known as:  XANAX  Take 1 tablet (0.5 mg total) by mouth 2 (two) times daily as needed for anxiety.     amitriptyline 25 MG tablet  Commonly known as:  ELAVIL  Take 25 mg by mouth at bedtime.     aspirin 81 MG EC tablet  Take 81 mg by mouth daily.     blood glucose meter kit and supplies  Dispense based on patient and insurance preference. Use up to four times daily as directed. (FOR ICD-9 250.00, 250.01).     colchicine 0.6 MG tablet  Commonly known as:  COLCRYS  Take 1 tablet (0.6 mg total) by mouth as needed.     furosemide 40 MG tablet  Commonly known as:  LASIX  TAKE ONE TABLET EVERY DAY     hydrochlorothiazide 12.5 MG tablet  Commonly known as:  HYDRODIURIL  Take 12.5 mg by mouth daily.     hydrOXYzine 10 MG tablet  Commonly known as:  ATARAX/VISTARIL  Take 1 tablet (10 mg total) by mouth 3 (three) times daily as needed for itching.     ibuprofen 400 MG tablet  Commonly known as:  ADVIL,MOTRIN  Take 400 mg by mouth as needed.     lisinopril 20 MG tablet  Commonly known as:  PRINIVIL,ZESTRIL  Take 10 mg by mouth daily.     metolazone 5 MG tablet  Commonly known as:  ZAROXOLYN  Take 5 mg by mouth as needed. Take 1 hr. Before taking lasix     multivitamin capsule  Take 1 capsule by mouth daily.     naproxen 500 MG tablet  Commonly known as:  NAPROSYN  Take 250 mg by mouth as needed.     NON FORMULARY  Take by mouth every morning. VITA-PULSE     NUVIGIL 250 MG tablet  Generic drug:  Armodafinil  Take 125 mg by mouth as needed. Now taking 1/2 tab.     ONE TOUCH ULTRA TEST test strip  Generic drug:  glucose blood     ONETOUCH DELICA LANCETS FINE Misc  CHECK BLOOD GLUCOSE (SUGAR) UPTO 4 TIMESDAILY AS DIRECTED  potassium chloride SA 20 MEQ tablet  Commonly known as:  K-DUR,KLOR-CON  Take 20 mEq by mouth once. Pt. States he takes when he uses Lasix     SYNTHROID 25 MCG tablet  Generic drug:  levothyroxine  Take 25 mcg by mouth daily.     TRULICITY 2.35 TI/1.4ER Sopn  Generic drug:  Dulaglutide  Inject 0.75 mg into the skin.        Allergies: No Known Allergies  Past Medical History, Surgical history, Social history, and Family History were reviewed and updated.  Review of Systems: All other 10 point review of systems is negative.   Physical Exam:  vitals were not taken for this visit.  Wt Readings from Last 3 Encounters:  07/01/15 265 lb (120.203 kg)  06/04/15 265 lb (120.203 kg)  02/27/15 258 lb (117.028 kg)    Ocular: Sclerae unicteric, pupils equal, round and reactive to light Ear-nose-throat: Oropharynx clear, dentition fair Lymphatic: No cervical supraclavicular or axillary adenopathy Lungs no rales or rhonchi, good excursion  bilaterally Heart regular rate and rhythm, no murmur appreciated Abd soft, nontender, positive bowel sounds MSK no focal spinal tenderness, no joint edema Neuro: non-focal, well-oriented, appropriate affect Breasts: Deferred  Lab Results  Component Value Date   WBC 6.7 07/01/2015   HGB 13.6 07/01/2015   HCT 42.8 07/01/2015   MCV 72* 07/01/2015   PLT 196 07/01/2015   Lab Results  Component Value Date   FERRITIN 27 07/01/2015   IRON 38* 07/01/2015   TIBC 455* 07/01/2015   UIBC 417* 07/01/2015   IRONPCTSAT 8* 07/01/2015   Lab Results  Component Value Date   RETICCTPCT 1.8 08/08/2013   RBC 5.98* 07/01/2015   RETICCTABS 93.6 08/08/2013   No results found for: KPAFRELGTCHN, LAMBDASER, KAPLAMBRATIO No results found for: IGGSERUM, IGA, IGMSERUM No results found for: Odetta Pink, SPEI   Chemistry      Component Value Date/Time   NA 141 07/01/2015 0915   NA 140 02/27/2015 1020   NA 141 10/14/2014 1146   K 4.2 07/01/2015 0915   K 4.0 02/27/2015 1020   K 4.4 10/14/2014 1146   CL 103 02/27/2015 1020   CL 102 10/14/2014 1146   CO2 28 07/01/2015 0915   CO2 24 02/27/2015 1020   CO2 31 10/14/2014 1146   BUN 19.0 07/01/2015 0915   BUN 20 02/27/2015 1020   BUN 16 10/14/2014 1146   CREATININE 1.1 07/01/2015 0915   CREATININE 1.03 02/27/2015 1020   CREATININE 0.9 10/14/2014 1146      Component Value Date/Time   CALCIUM 9.7 07/01/2015 0915   CALCIUM 9.1 02/27/2015 1020   CALCIUM 9.0 10/14/2014 1146   ALKPHOS 100 07/01/2015 0915   ALKPHOS 84 02/27/2015 1020   ALKPHOS 93* 10/14/2014 1146   AST 56* 07/01/2015 0915   AST 21 02/27/2015 1020   AST 29 10/14/2014 1146   ALT 72* 07/01/2015 0915   ALT 28 02/27/2015 1020   ALT 36 10/14/2014 1146   BILITOT 0.43 07/01/2015 0915   BILITOT 0.4 02/27/2015 1020   BILITOT 0.50 10/14/2014 1146     Impression and Plan: Chase Gutierrez is 64 year old gentleman with polycythemia. He is  symptomatic with fatigue today. His Hct is 43.6 so we will do a phlebotomy.  He and his wife are leaving for [place] at the end of the month and will not be coming back until the second week of March. He plans to find a place to have  his monthly labs and phlebotomy performed down there. He will contact us with the details for lab and phlebotomy orders and where to fax them.  With him being out of town we will plan to see him back around march 13th for his next follow-up and lab appointment. He will contact us with any questions or concerns. We can certainly see him back sooner if need be.  Eliezer Bottom, NP 12/8/20162:20 PM

## 2015-08-07 NOTE — Patient Instructions (Signed)
Therapeutic Phlebotomy, Care After  Refer to this sheet in the next few weeks. These instructions provide you with information about caring for yourself after your procedure. Your health care provider may also give you more specific instructions. Your treatment has been planned according to current medical practices, but problems sometimes occur. Call your health care provider if you have any problems or questions after your procedure.  WHAT TO EXPECT AFTER THE PROCEDURE  After your procedure, it is common to have:   Light-headedness or dizziness. You may feel faint.   Nausea.   Tiredness.  HOME CARE INSTRUCTIONS  Activities   Return to your normal activities as directed by your health care provider. Most people can go back to their normal activities right away.   Avoid strenuous physical activity and heavy lifting or pulling for about 5 hours after the procedure. Do not lift anything that is heavier than 10 lb (4.5 kg).   Athletes should avoid strenuous exercise for at least 12 hours.   Change positions slowly for the remainder of the day. This will help to prevent light-headedness or fainting.   If you feel light-headed, lie down until the feeling goes away.  Eating and Drinking   Be sure to eat well-balanced meals for the next 24 hours.   Drink enough fluid to keep your urine clear or pale yellow.   Avoid drinking alcohol on the day that you had the procedure.  Care of the Needle Insertion Site   Keep your bandage dry. You can remove the bandage after about 5 hours or as directed by your health care provider.   If you have bleeding from the needle insertion site, elevate your arm and press firmly on the site until the bleeding stops.   If you have bruising at the site, apply ice to the area:   Put ice in a plastic bag.   Place a towel between your skin and the bag.   Leave the ice on for 20 minutes, 2-3 times a day for the first 24 hours.   If the swelling does not go away after 24 hours, apply  a warm, moist washcloth to the area for 20 minutes, 2-3 times a day.  General Instructions   Avoid smoking for at least 30 minutes after the procedure.   Keep all follow-up visits as directed by your health care provider. It is important to continue with further therapeutic phlebotomy treatments as directed.  SEEK MEDICAL CARE IF:   You have redness, swelling, or pain at the needle insertion site.   You have fluid, blood, or pus coming from the needle insertion site.   You feel light-headed, dizzy, or nauseated, and the feeling does not go away.   You notice new bruising at the needle insertion site.   You feel weaker than normal.   You have a fever or chills.  SEEK IMMEDIATE MEDICAL CARE IF:   You have severe nausea or vomiting.   You have chest pain.   You have trouble breathing.    This information is not intended to replace advice given to you by your health care provider. Make sure you discuss any questions you have with your health care provider.    Document Released: 01/18/2011 Document Revised: 12/31/2014 Document Reviewed: 08/12/2014  Elsevier Interactive Patient Education 2016 [employer]

## 2015-08-08 LAB — IRON AND TIBC
%SAT: 5 % — AB (ref 20–55)
Iron: 25 ug/dL — ABNORMAL LOW (ref 42–163)
TIBC: 470 ug/dL — ABNORMAL HIGH (ref 202–409)
UIBC: 445 ug/dL — AB (ref 117–376)

## 2015-08-08 LAB — FERRITIN: Ferritin: 31 ng/ml (ref 22–316)

## 2015-08-13 ENCOUNTER — Ambulatory Visit (INDEPENDENT_AMBULATORY_CARE_PROVIDER_SITE_OTHER): Payer: 59 | Admitting: Neurology

## 2015-08-13 ENCOUNTER — Encounter: Payer: Self-pay | Admitting: Neurology

## 2015-08-13 VITALS — BP 122/90 | HR 88 | Resp 20 | Ht 71.0 in | Wt 258.0 lb

## 2015-08-13 DIAGNOSIS — G4737 Central sleep apnea in conditions classified elsewhere: Secondary | ICD-10-CM | POA: Diagnosis not present

## 2015-08-13 DIAGNOSIS — G4710 Hypersomnia, unspecified: Secondary | ICD-10-CM | POA: Insufficient documentation

## 2015-08-13 DIAGNOSIS — G4730 Sleep apnea, unspecified: Secondary | ICD-10-CM

## 2015-08-13 DIAGNOSIS — R0602 Shortness of breath: Secondary | ICD-10-CM | POA: Diagnosis not present

## 2015-08-13 DIAGNOSIS — I509 Heart failure, unspecified: Secondary | ICD-10-CM
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
## 2015-08-13 NOTE — Patient Instructions (Signed)
Heart Failure  Heart failure means your heart has trouble pumping blood. This makes it hard for your body to work well. Heart failure is usually a long-term (chronic) condition. You must take good care of yourself and follow your doctor's treatment plan.  HOME CARE   Take your heart medicine as told by your doctor.    Do not stop taking medicine unless your doctor tells you to.    Do not skip any dose of medicine.    Refill your medicines before they run out.    Take other medicines only as told by your doctor or pharmacist.   Stay active if told by your doctor. The elderly and people with severe heart failure should talk with a doctor about physical activity.   Eat heart-healthy foods. Choose foods that are without trans fat and are low in saturated fat, cholesterol, and salt (sodium). This includes fresh or frozen fruits and vegetables, fish, lean meats, fat-free or low-fat dairy foods, whole grains, and high-fiber foods. Lentils and dried peas and beans (legumes) are also good choices.   Limit salt if told by your doctor.   Cook in a healthy way. Roast, grill, broil, bake, poach, steam, or stir-fry foods.   Limit fluids as told by your doctor.   Weigh yourself every morning. Do this after you pee (urinate) and before you eat breakfast. Write down your weight to give to your doctor.   Take your blood pressure and write it down if your doctor tells you to.   Ask your doctor how to check your pulse. Check your pulse as told.   Lose weight if told by your doctor.   Stop smoking or chewing tobacco. Do not use gum or patches that help you quit without your doctor's approval.   Schedule and go to doctor visits as told.   Nonpregnant women should have no more than 1 drink a day. Men should have no more than 2 drinks a day. Talk to your doctor about drinking alcohol.   Stop illegal drug use.   Stay current with shots (immunizations).   Manage your health conditions as told by your doctor.   Learn to  manage your stress.   Rest when you are tired.   If it is really hot outside:    Avoid intense activities.    Use air conditioning or fans, or get in a cooler place.    Avoid caffeine and alcohol.    Wear loose-fitting, lightweight, and light-colored clothing.   If it is really cold outside:    Avoid intense activities.    Layer your clothing.    Wear mittens or gloves, a hat, and a scarf when going outside.    Avoid alcohol.   Learn about heart failure and get support as needed.   Get help to maintain or improve your quality of life and your ability to care for yourself as needed.  GET HELP IF:    You gain weight quickly.   You are more short of breath than usual.   You cannot do your normal activities.   You tire easily.   You cough more than normal, especially with activity.   You have any or more puffiness (swelling) in areas such as your hands, feet, ankles, or belly (abdomen).   You cannot sleep because it is hard to breathe.   You feel like your heart is beating fast (palpitations).   You get dizzy or light-headed when you stand up.  GET HELP   RIGHT AWAY IF:    You have trouble breathing.   There is a change in mental status, such as becoming less alert or not being able to focus.   You have chest pain or discomfort.   You faint.  MAKE SURE YOU:    Understand these instructions.   Will watch your condition.   Will get help right away if you are not doing well or get worse.     This information is not intended to replace advice given to you by your health care provider. Make sure you discuss any questions you have with your health care provider.     Document Released: 05/25/2008 Document Revised: 09/06/2014 Document Reviewed: 10/02/2012  [employer]vier Interactive Patient Education 2016 [employer]

## 2015-08-15 ENCOUNTER — Other Ambulatory Visit: Payer: Self-pay | Admitting: Family

## 2015-08-15 ENCOUNTER — Other Ambulatory Visit: Payer: Self-pay | Admitting: Hematology & Oncology

## 2015-10-13 ENCOUNTER — Other Ambulatory Visit: Payer: Self-pay | Admitting: Hematology & Oncology

## 2015-11-10 ENCOUNTER — Ambulatory Visit (HOSPITAL_BASED_OUTPATIENT_CLINIC_OR_DEPARTMENT_OTHER): Payer: 59 | Admitting: Family

## 2015-11-10 ENCOUNTER — Ambulatory Visit (HOSPITAL_BASED_OUTPATIENT_CLINIC_OR_DEPARTMENT_OTHER): Payer: 59

## 2015-11-10 ENCOUNTER — Other Ambulatory Visit (HOSPITAL_BASED_OUTPATIENT_CLINIC_OR_DEPARTMENT_OTHER): Payer: 59

## 2015-11-10 ENCOUNTER — Encounter: Payer: Self-pay | Admitting: Family

## 2015-11-10 ENCOUNTER — Ambulatory Visit (INDEPENDENT_AMBULATORY_CARE_PROVIDER_SITE_OTHER): Payer: 59 | Admitting: Neurology

## 2015-11-10 VITALS — BP 123/74 | HR 78 | Temp 97.9°F | Resp 18 | Ht 71.0 in | Wt 260.0 lb

## 2015-11-10 DIAGNOSIS — I509 Heart failure, unspecified: Secondary | ICD-10-CM

## 2015-11-10 DIAGNOSIS — G4730 Sleep apnea, unspecified: Secondary | ICD-10-CM | POA: Diagnosis not present

## 2015-11-10 DIAGNOSIS — R5382 Chronic fatigue, unspecified: Secondary | ICD-10-CM

## 2015-11-10 DIAGNOSIS — D509 Iron deficiency anemia, unspecified: Secondary | ICD-10-CM

## 2015-11-10 DIAGNOSIS — D45 Polycythemia vera: Secondary | ICD-10-CM

## 2015-11-10 DIAGNOSIS — E119 Type 2 diabetes mellitus without complications: Secondary | ICD-10-CM

## 2015-11-10 DIAGNOSIS — R0602 Shortness of breath: Secondary | ICD-10-CM

## 2015-11-10 DIAGNOSIS — G4737 Central sleep apnea in conditions classified elsewhere: Secondary | ICD-10-CM

## 2015-11-10 DIAGNOSIS — G4710 Hypersomnia, unspecified: Secondary | ICD-10-CM

## 2015-11-10 LAB — CBC WITH DIFFERENTIAL (CANCER CENTER ONLY)
BASO#: 0.2 10*3/uL (ref 0.0–0.2)
BASO%: 1.9 % (ref 0.0–2.0)
EOS ABS: 0.5 10*3/uL (ref 0.0–0.5)
EOS%: 5.3 % (ref 0.0–7.0)
HEMATOCRIT: 39.1 % (ref 38.7–49.9)
HGB: 12.2 g/dL — ABNORMAL LOW (ref 13.0–17.1)
LYMPH#: 1.9 10*3/uL (ref 0.9–3.3)
LYMPH%: 21.6 % (ref 14.0–48.0)
MCH: 20.5 pg — AB (ref 28.0–33.4)
MCHC: 31.2 g/dL — AB (ref 32.0–35.9)
MCV: 66 fL — AB (ref 82–98)
MONO#: 0.6 10*3/uL (ref 0.1–0.9)
MONO%: 6.5 % (ref 0.0–13.0)
NEUT%: 64.7 % (ref 40.0–80.0)
NEUTROS ABS: 5.7 10*3/uL (ref 1.5–6.5)
Platelets: 239 10*3/uL (ref 145–400)
RBC: 5.94 10*6/uL — ABNORMAL HIGH (ref 4.20–5.70)
RDW: 21 % — AB (ref 11.1–15.7)
WBC: 8.8 10*3/uL (ref 4.0–10.0)

## 2015-11-10 LAB — COMPREHENSIVE METABOLIC PANEL
ALT: 36 U/L (ref 0–55)
ANION GAP: 9 meq/L (ref 3–11)
AST: 25 U/L (ref 5–34)
Albumin: 4 g/dL (ref 3.5–5.0)
Alkaline Phosphatase: 107 U/L (ref 40–150)
BUN: 24.1 mg/dL (ref 7.0–26.0)
CALCIUM: 9.2 mg/dL (ref 8.4–10.4)
CHLORIDE: 101 meq/L (ref 98–109)
CO2: 29 mEq/L (ref 22–29)
CREATININE: 1.4 mg/dL — AB (ref 0.7–1.3)
EGFR: 55 mL/min/{1.73_m2} — ABNORMAL LOW (ref >90)
Glucose: 160 mg/dl — ABNORMAL HIGH (ref 70–140)
POTASSIUM: 4.7 meq/L (ref 3.5–5.1)
Sodium: 140 mEq/L (ref 136–145)
Total Bilirubin: 0.31 mg/dL (ref 0.20–1.20)
Total Protein: 7.3 g/dL (ref 6.4–8.3)

## 2015-11-10 LAB — IRON AND TIBC
%SAT: 7 % — AB (ref 20–55)
IRON: 28 ug/dL — AB (ref 42–163)
TIBC: 428 ug/dL — ABNORMAL HIGH (ref 202–409)
UIBC: 399 ug/dL — ABNORMAL HIGH (ref 117–376)

## 2015-11-10 LAB — FERRITIN: FERRITIN: 10 ng/mL — AB (ref 22–316)

## 2015-11-10 MED ORDER — SODIUM CHLORIDE 0.9 % IV SOLN
Freq: Once | INTRAVENOUS | Status: AC
  Administered 2015-11-10: 10:00:00 via INTRAVENOUS

## 2015-11-10 NOTE — Progress Notes (Signed)
Hematology and Oncology Follow Up Visit  DAIDEN COLTRANE [id number] 1951-04-04 65 y.o. 11/10/2015   Principle Diagnosis:  Polycythemia vera - JAK2 NEGATIVE  Current Therapy:   Phlebotomy to maintain hematocrit below 40% Aspirin 81 mg by mouth daily   Interim History: Mr. Berrios is back today with his wife for a follow-up. He is feeling very fatigued. His last phlebotomy was in December. His Hct today is 39.1.  He feels that his CPAP may not be calibrated correctly and is having another sleep study done tonight.  He was also quite iron deficient from being phlebotomized.  He is eating healthy and staying well hydrated. His blood sugars are well controlled on Trulicity. His weight is stable.  No fever, chills, rash, itching, n/v, cough, dizziness, headache, SOB, chest pain, palpitations, abdominal pain or changes in bowel or bladder habits.  No swelling, tenderness, numbness or tingling in his extremities. He has not had to take lasix in 2 months. No c/o joint pain.   Medications:    Medication List       This list is accurate as of: 11/10/15  9:35 AM.  Always use your most recent med list.               ALPRAZolam 0.5 MG tablet  Commonly known as:  XANAX  TAKE ONE TABLET TWICE DAILY AS NEEDED FOR ANXIETY     amitriptyline 25 MG tablet  Commonly known as:  ELAVIL  Take 25 mg by mouth at bedtime.     aspirin 81 MG EC tablet  Take 81 mg by mouth daily.     blood glucose meter kit and supplies  Dispense based on patient and insurance preference. Use up to four times daily as directed. (FOR ICD-9 250.00, 250.01).     colchicine 0.6 MG tablet  Commonly known as:  COLCRYS  Take 1 tablet (0.6 mg total) by mouth as needed.     furosemide 40 MG tablet  Commonly known as:  LASIX  TAKE ONE TABLET EVERY DAY     hydrochlorothiazide 12.5 MG tablet  Commonly known as:  HYDRODIURIL  Take 12.5 mg by mouth daily.     hydrOXYzine 10 MG tablet  Commonly known as:   ATARAX/VISTARIL  Take 1 tablet (10 mg total) by mouth 3 (three) times daily as needed for itching.     ibuprofen 400 MG tablet  Commonly known as:  ADVIL,MOTRIN  Take 400 mg by mouth as needed.     metolazone 5 MG tablet  Commonly known as:  ZAROXOLYN  Take 5 mg by mouth as needed. Take 1 hr. Before taking lasix     multivitamin capsule  Take 1 capsule by mouth daily.     naproxen 500 MG tablet  Commonly known as:  NAPROSYN  Take 250 mg by mouth as needed.     NON FORMULARY  Take by mouth every morning. VITA-PULSE     ONE TOUCH ULTRA TEST test strip  Generic drug:  glucose blood  CHECK BLOOD GLUCOSE (SUGAR) UPTO 4 TIMESDAILY AS DIRECTED     ONETOUCH DELICA LANCETS FINE Misc  CHECK BLOOD GLUCOSE (SUGAR) UPTO 4 TIMESDAILY AS DIRECTED     potassium chloride SA 20 MEQ tablet  Commonly known as:  K-DUR,KLOR-CON  Take 20 mEq by mouth once. Pt. States he takes when he uses Lasix     SYNTHROID 25 MCG tablet  Generic drug:  levothyroxine  Take 25 mcg by mouth daily.  TRULICITY 7.25 DG/6.4QI Sopn  Generic drug:  Dulaglutide  Inject 0.75 mg into the skin.        Allergies: No Known Allergies  Past Medical History, Surgical history, Social history, and Family History were reviewed and updated.  Review of Systems: All other 10 point review of systems is negative.   Physical Exam:  height is _0  (1.803 m) and weight is 260 lb (117.935 kg). His oral temperature is 97.9 F (36.6 C). His blood pressure is 123/74 and his pulse is 78. His respiration is 18.   Wt Readings from Last 3 Encounters:  11/10/15 260 lb (117.935 kg)  08/13/15 258 lb (117.028 kg)  08/07/15 255 lb (115.667 kg)    Ocular: Sclerae unicteric, pupils equal, round and reactive to light Ear-nose-throat: Oropharynx clear, dentition fair Lymphatic: No cervical supraclavicular or axillary adenopathy Lungs no rales or rhonchi, good excursion bilaterally Heart regular rate and rhythm, no murmur  appreciated Abd soft, nontender, positive bowel sounds, no liver or spleen tip palpated on exam MSK no focal spinal tenderness, no joint edema Neuro: non-focal, well-oriented, appropriate affect Breasts: Deferred  Lab Results  Component Value Date   WBC 8.8 11/10/2015   HGB 12.2* 11/10/2015   HCT 39.1 11/10/2015   MCV 66* 11/10/2015   PLT 239 11/10/2015   Lab Results  Component Value Date   FERRITIN 31 08/07/2015   IRON 25* 08/07/2015   TIBC 470* 08/07/2015   UIBC 445* 08/07/2015   IRONPCTSAT 5* 08/07/2015   Lab Results  Component Value Date   RETICCTPCT 1.8 08/08/2013   RBC 5.94* 11/10/2015   RETICCTABS 93.6 08/08/2013   No results found for: KPAFRELGTCHN, LAMBDASER, KAPLAMBRATIO No results found for: IGGSERUM, IGA, IGMSERUM No results found for: Kathrynn Ducking, MSPIKE, SPEI   Chemistry      Component Value Date/Time   NA 143 08/07/2015 1403   NA 141 07/01/2015 0915   NA 141 10/14/2014 1146   K 4.0 08/07/2015 1403   K 4.2 07/01/2015 0915   K 4.4 10/14/2014 1146   CL 99 08/07/2015 1403   CL 102 10/14/2014 1146   CO2 29 08/07/2015 1403   CO2 28 07/01/2015 0915   CO2 31 10/14/2014 1146   BUN 18 08/07/2015 1403   BUN 19.0 07/01/2015 0915   BUN 16 10/14/2014 1146   CREATININE 1.28* 08/07/2015 1403   CREATININE 1.1 07/01/2015 0915   CREATININE 0.9 10/14/2014 1146      Component Value Date/Time   CALCIUM 9.9 08/07/2015 1403   CALCIUM 9.7 07/01/2015 0915   CALCIUM 9.0 10/14/2014 1146   ALKPHOS 88 08/07/2015 1403   ALKPHOS 100 07/01/2015 0915   ALKPHOS 93* 10/14/2014 1146   AST 33 08/07/2015 1403   AST 56* 07/01/2015 0915   AST 29 10/14/2014 1146   ALT 52* 08/07/2015 1403   ALT 72* 07/01/2015 0915   ALT 36 10/14/2014 1146   BILITOT 0.3 08/07/2015 1403   BILITOT 0.43 07/01/2015 0915   BILITOT 0.50 10/14/2014 1146     Impression and Plan: Mr. Lafauci is 65 year old gentleman with polycythemia. He is symptomatic  with extreme fatigue. His Hct today is 39.1 so we will go ahead and do a phlebotomy today per his request.  He is having a repeat sleep study tonight to make sure his CPAP is calibrated correctly.  He is also iron deficient and chewing lots of ice. He prefers not to take an iron supplement due to the GI effects.  We will plan to see him in May once he gets back from [place]. He will contact us for lab orders and will schedule a phlebotomy if needed while he is out of town.  He will contact us with any questions or concerns. We can certainly see him back sooner if need be.  Eliezer Bottom, NP 3/13/20179:35 AM

## 2015-11-10 NOTE — Progress Notes (Signed)
Chase Gutierrez presents today for phlebotomy per MD orders. Phlebotomy procedure started at 0945 and ended at 1000. 500 grams removed. Patient observed for 30 minutes after procedure without any incident. Patient tolerated procedure well. IV needle removed intact.

## 2015-11-10 NOTE — Patient Instructions (Signed)

## 2015-11-11 LAB — HEMOGLOBIN A1C
Est. average glucose Bld gHb Est-mCnc: 140 mg/dL
HEMOGLOBIN A1C: 6.5 % — AB (ref 4.8–5.6)

## 2015-11-11 NOTE — Sleep Study (Signed)
Please see the scanned sleep study interpretation located in the Procedure tab within the Chart Review section. 

## 2015-11-17 ENCOUNTER — Telehealth: Payer: Self-pay

## 2015-11-17 DIAGNOSIS — G4733 Obstructive sleep apnea (adult) (pediatric): Secondary | ICD-10-CM

## 2015-11-17 NOTE — Telephone Encounter (Signed)
Spoke to pt and advised her that her sleep study shows osa and Dr. Brett Fairy recommends starting a dream startion auto cpap. Pt is agreeable. I advised him that I would send the order to [facility]. I advised pt to avoid sedative-hypnotics which may worsen sleep apnea, alcohol and tobacco. I advised pt to lose weight, diet and exercise if not contraindicated by his other physicians. I advised pt to avoid driving or operating hazardous machinery while sleepy. A follow up appt was made for 6/5 at 10:00. Pt verbalized understanding.

## 2015-11-23 ENCOUNTER — Encounter: Payer: Self-pay | Admitting: *Deleted

## 2016-01-27 ENCOUNTER — Telehealth: Payer: Self-pay | Admitting: Neurology

## 2016-01-27 NOTE — Telephone Encounter (Signed)
Message For: [facility]                  Taken 30-MAY-17 at 10:45AM by [facility] ------------------------------------------------------------  Caller  Erline Hau             CID  [id number]   Patient  SAME                  Pt's Dr  [facility]       Area Code  336  Phone#  [phone] *  DOB  07-09-2051      RE  NEEDS TO RESCHEDULE APPT 6/5 @10 //PCB                                                                   Disp:Y/N  N  If Y = C/B If No Response In 30minutes  ============================================================  Spoke with pt and he will call back when he is ready to schedule.

## 2016-01-29 ENCOUNTER — Ambulatory Visit (HOSPITAL_BASED_OUTPATIENT_CLINIC_OR_DEPARTMENT_OTHER): Payer: 59 | Admitting: Family

## 2016-01-29 ENCOUNTER — Other Ambulatory Visit (HOSPITAL_BASED_OUTPATIENT_CLINIC_OR_DEPARTMENT_OTHER): Payer: 59

## 2016-01-29 ENCOUNTER — Ambulatory Visit (HOSPITAL_BASED_OUTPATIENT_CLINIC_OR_DEPARTMENT_OTHER): Payer: 59

## 2016-01-29 ENCOUNTER — Encounter: Payer: Self-pay | Admitting: Family

## 2016-01-29 ENCOUNTER — Other Ambulatory Visit: Payer: Self-pay | Admitting: Family

## 2016-01-29 VITALS — BP 107/68 | HR 81 | Temp 97.4°F | Resp 20 | Ht 71.0 in | Wt 243.0 lb

## 2016-01-29 VITALS — BP 96/55 | HR 70 | Resp 20

## 2016-01-29 DIAGNOSIS — E119 Type 2 diabetes mellitus without complications: Secondary | ICD-10-CM

## 2016-01-29 DIAGNOSIS — D45 Polycythemia vera: Secondary | ICD-10-CM

## 2016-01-29 DIAGNOSIS — Z794 Long term (current) use of insulin: Secondary | ICD-10-CM

## 2016-01-29 LAB — COMPREHENSIVE METABOLIC PANEL
ALT: 37 U/L (ref 0–55)
AST: 27 U/L (ref 5–34)
Albumin: 4.4 g/dL (ref 3.5–5.0)
Alkaline Phosphatase: 90 U/L (ref 40–150)
Anion Gap: 12 mEq/L — ABNORMAL HIGH (ref 3–11)
BUN: 31.6 mg/dL — ABNORMAL HIGH (ref 7.0–26.0)
CO2: 26 mEq/L (ref 22–29)
Calcium: 9.4 mg/dL (ref 8.4–10.4)
Chloride: 102 mEq/L (ref 98–109)
Creatinine: 1.8 mg/dL — ABNORMAL HIGH (ref 0.7–1.3)
EGFR: 39 mL/min/{1.73_m2} — ABNORMAL LOW (ref >90)
Glucose: 131 mg/dl (ref 70–140)
Potassium: 3.7 mEq/L (ref 3.5–5.1)
Sodium: 140 mEq/L (ref 136–145)
Total Bilirubin: 0.39 mg/dL (ref 0.20–1.20)
Total Protein: 7.7 g/dL (ref 6.4–8.3)

## 2016-01-29 LAB — CBC WITH DIFFERENTIAL (CANCER CENTER ONLY)
BASO#: 0.3 10*3/uL — ABNORMAL HIGH (ref 0.0–0.2)
BASO%: 2.9 % — ABNORMAL HIGH (ref 0.0–2.0)
EOS ABS: 0.3 10*3/uL (ref 0.0–0.5)
EOS%: 3.8 % (ref 0.0–7.0)
HCT: 43.9 % (ref 38.7–49.9)
HEMOGLOBIN: 13.8 g/dL (ref 13.0–17.1)
LYMPH#: 1.6 10*3/uL (ref 0.9–3.3)
LYMPH%: 18.5 % (ref 14.0–48.0)
MCH: 21.3 pg — AB (ref 28.0–33.4)
MCHC: 31.4 g/dL — ABNORMAL LOW (ref 32.0–35.9)
MCV: 68 fL — ABNORMAL LOW (ref 82–98)
MONO#: 0.9 10*3/uL (ref 0.1–0.9)
MONO%: 10.4 % (ref 0.0–13.0)
NEUT%: 64.4 % (ref 40.0–80.0)
NEUTROS ABS: 5.5 10*3/uL (ref 1.5–6.5)
PLATELETS: 278 10*3/uL (ref 145–400)
RBC: 6.49 10*6/uL — AB (ref 4.20–5.70)
RDW: 20.6 % — ABNORMAL HIGH (ref 11.1–15.7)
WBC: 8.5 10*3/uL (ref 4.0–10.0)

## 2016-01-29 LAB — IRON AND TIBC
%SAT: 6 % — AB (ref 20–55)
IRON: 25 ug/dL — AB (ref 42–163)
TIBC: 431 ug/dL — ABNORMAL HIGH (ref 202–409)
UIBC: 406 ug/dL — ABNORMAL HIGH (ref 117–376)

## 2016-01-29 LAB — FERRITIN: Ferritin: 11 ng/ml — ABNORMAL LOW (ref 22–316)

## 2016-01-29 MED ORDER — SODIUM CHLORIDE 0.9 % IV SOLN
Freq: Once | INTRAVENOUS | Status: DC
Start: 2016-01-29 — End: 2016-01-29
  Administered 2016-01-29: 11:00:00 via INTRAVENOUS

## 2016-01-29 NOTE — Progress Notes (Signed)
Hematology and Oncology Follow Up Visit  Chase Gutierrez [id number] 05-26-1951 65 y.o. 01/29/2016   Principle Diagnosis:  Polycythemia vera - JAK2 NEGATIVE  Current Therapy:   Phlebotomy to maintain hematocrit below 40% Aspirin 81 mg by mouth daily   Interim History: Chase Gutierrez is back today for follow-up. He is feeling fatigued and has a ruddy complexion at this time. His Hct at this time is 43.9. He was phlebotomized in March and had a nice response.  He has started a new diet regimen with [name] and following the Atkin's diet. He has lost 17 lbs so far and is quite excited about this. His blood sugars have also improved. He is still on Trulicity.   He is eating well but states that he should be drinking more fluids.  He states that he has had a couple of episodes of palpitations lasting less than 2 minutes each time. He states that is this becomes more frequent or if he develops chest pain or other cardiac symptoms he will contact his cardiologist.  No fever, chills, ice cravings, rash, itching, n/v, cough, dizziness, headache, vision changes, SOB, chest pain, abdominal pain or changes in bowel or bladder habits.  He uses his CPAP consistently at night.  No swelling or tenderness in his extremities. 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 No c/o joint pain.   Medications:    Medication List       This list is accurate as of: 01/29/16  9:15 AM.  Always use your most recent med list.               ALPRAZolam 0.5 MG tablet  Commonly known as:  XANAX  TAKE ONE TABLET TWICE DAILY AS NEEDED FOR ANXIETY     amitriptyline 25 MG tablet  Commonly known as:  ELAVIL  Take 25 mg by mouth at bedtime.     ANDROGEL PUMP 20.25 MG/ACT (1.62%) Gel  Generic drug:  Testosterone     aspirin 81 MG EC tablet  Take 81 mg by mouth daily.     blood glucose meter kit and supplies  Dispense based on patient and insurance preference. Use up to four times daily as directed. (FOR ICD-9 250.00,  250.01).     colchicine 0.6 MG tablet  Commonly known as:  COLCRYS  Take 1 tablet (0.6 mg total) by mouth as needed.     furosemide 40 MG tablet  Commonly known as:  LASIX  TAKE ONE TABLET EVERY DAY     hydrochlorothiazide 12.5 MG tablet  Commonly known as:  HYDRODIURIL  Take 12.5 mg by mouth daily.     hydrOXYzine 10 MG tablet  Commonly known as:  ATARAX/VISTARIL  Take 1 tablet (10 mg total) by mouth 3 (three) times daily as needed for itching.     ibuprofen 400 MG tablet  Commonly known as:  ADVIL,MOTRIN  Take 400 mg by mouth as needed.     metolazone 5 MG tablet  Commonly known as:  ZAROXOLYN  Take 5 mg by mouth as needed. Reported on 01/29/2016     modafinil 200 MG tablet  Commonly known as:  PROVIGIL     multivitamin capsule  Take 1 capsule by mouth daily.     naproxen 500 MG tablet  Commonly known as:  NAPROSYN  Take 250 mg by mouth as needed.     NON FORMULARY  Take by mouth every morning. VITA-PULSE     ONE TOUCH ULTRA TEST test strip  Generic drug:  glucose blood  CHECK BLOOD GLUCOSE (SUGAR) UPTO 4 TIMESDAILY AS DIRECTED     ONETOUCH DELICA LANCETS FINE Misc  CHECK BLOOD GLUCOSE (SUGAR) UPTO 4 TIMESDAILY AS DIRECTED     potassium chloride SA 20 MEQ tablet  Commonly known as:  K-DUR,KLOR-CON  Take 20 mEq by mouth once. Pt. States he takes when he uses Lasix     SYNTHROID 25 MCG tablet  Generic drug:  levothyroxine  Take 25 mcg by mouth daily.     TRULICITY 1.5 PO/2.4MP Sopn  Generic drug:  Dulaglutide     valsartan 160 MG tablet  Commonly known as:  DIOVAN        Allergies: No Known Allergies  Past Medical History, Surgical history, Social history, and Family History were reviewed and updated.  Review of Systems: All other 10 point review of systems is negative.   Physical Exam:  height is _0  (1.803 m) and weight is 243 lb (110.224 kg). His oral temperature is 97.4 F (36.3 C). His blood pressure is 107/68 and his pulse is 81. His  respiration is 20.   Wt Readings from Last 3 Encounters:  01/29/16 243 lb (110.224 kg)  11/10/15 260 lb (117.935 kg)  08/13/15 258 lb (117.028 kg)    Ocular: Sclerae unicteric, pupils equal, round and reactive to light Ear-nose-throat: Oropharynx clear, dentition fair Lymphatic: No cervical supraclavicular or axillary adenopathy Lungs no rales or rhonchi, good excursion bilaterally Heart regular rate and rhythm, no murmur appreciated Abd soft, nontender, positive bowel sounds, no liver or spleen tip palpated on exam, no fluid wave MSK no focal spinal tenderness, no joint edema Neuro: non-focal, well-oriented, appropriate affect Breasts: Deferred  Lab Results  Component Value Date   WBC 8.5 01/29/2016   HGB 13.8 01/29/2016   HCT 43.9 01/29/2016   MCV 68* 01/29/2016   PLT 278 01/29/2016   Lab Results  Component Value Date   FERRITIN 10* 11/10/2015   IRON 28* 11/10/2015   TIBC 428* 11/10/2015   UIBC 399* 11/10/2015   IRONPCTSAT 7* 11/10/2015   Lab Results  Component Value Date   RETICCTPCT 1.8 08/08/2013   RBC 6.49* 01/29/2016   RETICCTABS 93.6 08/08/2013   No results found for: KPAFRELGTCHN, LAMBDASER, KAPLAMBRATIO No results found for: IGGSERUM, IGA, IGMSERUM No results found for: Odetta Pink, SPEI   Chemistry      Component Value Date/Time   NA 140 11/10/2015 0825   NA 143 08/07/2015 1403   NA 141 10/14/2014 1146   K 4.7 11/10/2015 0825   K 4.0 08/07/2015 1403   K 4.4 10/14/2014 1146   CL 99 08/07/2015 1403   CL 102 10/14/2014 1146   CO2 29 11/10/2015 0825   CO2 29 08/07/2015 1403   CO2 31 10/14/2014 1146   BUN 24.1 11/10/2015 0825   BUN 18 08/07/2015 1403   BUN 16 10/14/2014 1146   CREATININE 1.4* 11/10/2015 0825   CREATININE 1.28* 08/07/2015 1403   CREATININE 0.9 10/14/2014 1146      Component Value Date/Time   CALCIUM 9.2 11/10/2015 0825   CALCIUM 9.9 08/07/2015 1403   CALCIUM 9.0 10/14/2014  1146   ALKPHOS 107 11/10/2015 0825   ALKPHOS 88 08/07/2015 1403   ALKPHOS 93* 10/14/2014 1146   AST 25 11/10/2015 0825   AST 33 08/07/2015 1403   AST 29 10/14/2014 1146   ALT 36 11/10/2015 0825   ALT 52* 08/07/2015 1403   ALT 36 10/14/2014 1146  BILITOT 0.31 11/10/2015 0825   BILITOT 0.3 08/07/2015 1403   BILITOT 0.50 10/14/2014 1146     Impression and Plan: Chase Gutierrez is 65 yo gentleman with polycythemia. He is symptomatic with fatigue and has a Hct of 43.9 at this time.  We will phlebotomize him today and also give him replacement fluids afterwards.  We will plan to see him in August once he gets back from [place]. He will contact us for lab orders and will schedule a phlebotomy if needed while he is out of town.  He will contact us with any questions or concerns. We can certainly see him back sooner if need be.  Eliezer Bottom, NP 6/1/20179:15 AM

## 2016-01-29 NOTE — Patient Instructions (Signed)
Therapeutic Phlebotomy, Care After  Refer to this sheet in the next few weeks. These instructions provide you with information about caring for yourself after your procedure. Your health care provider may also give you more specific instructions. Your treatment has been planned according to current medical practices, but problems sometimes occur. Call your health care provider if you have any problems or questions after your procedure.  WHAT TO EXPECT AFTER THE PROCEDURE  After your procedure, it is common to have:   Light-headedness or dizziness. You may feel faint.   Nausea.   Tiredness.  HOME CARE INSTRUCTIONS  Activities   Return to your normal activities as directed by your health care provider. Most people can go back to their normal activities right away.   Avoid strenuous physical activity and heavy lifting or pulling for about 5 hours after the procedure. Do not lift anything that is heavier than 10 lb (4.5 kg).   Athletes should avoid strenuous exercise for at least 12 hours.   Change positions slowly for the remainder of the day. This will help to prevent light-headedness or fainting.   If you feel light-headed, lie down until the feeling goes away.  Eating and Drinking   Be sure to eat well-balanced meals for the next 24 hours.   Drink enough fluid to keep your urine clear or pale yellow.   Avoid drinking alcohol on the day that you had the procedure.  Care of the Needle Insertion Site   Keep your bandage dry. You can remove the bandage after about 5 hours or as directed by your health care provider.   If you have bleeding from the needle insertion site, elevate your arm and press firmly on the site until the bleeding stops.   If you have bruising at the site, apply ice to the area:   Put ice in a plastic bag.   Place a towel between your skin and the bag.   Leave the ice on for 20 minutes, 2-3 times a day for the first 24 hours.   If the swelling does not go away after 24 hours, apply  a warm, moist washcloth to the area for 20 minutes, 2-3 times a day.  General Instructions   Avoid smoking for at least 30 minutes after the procedure.   Keep all follow-up visits as directed by your health care provider. It is important to continue with further therapeutic phlebotomy treatments as directed.  SEEK MEDICAL CARE IF:   You have redness, swelling, or pain at the needle insertion site.   You have fluid, blood, or pus coming from the needle insertion site.   You feel light-headed, dizzy, or nauseated, and the feeling does not go away.   You notice new bruising at the needle insertion site.   You feel weaker than normal.   You have a fever or chills.  SEEK IMMEDIATE MEDICAL CARE IF:   You have severe nausea or vomiting.   You have chest pain.   You have trouble breathing.    This information is not intended to replace advice given to you by your health care provider. Make sure you discuss any questions you have with your health care provider.    Document Released: 01/18/2011 Document Revised: 12/31/2014 Document Reviewed: 08/12/2014  Elsevier Interactive Patient Education 2016 [employer]

## 2016-01-30 LAB — HEMOGLOBIN A1C
Est. average glucose Bld gHb Est-mCnc: 123 mg/dL
Hemoglobin A1c: 5.9 % — ABNORMAL HIGH (ref 4.8–5.6)

## 2016-02-02 ENCOUNTER — Ambulatory Visit: Payer: Self-pay | Admitting: Neurology

## 2016-02-16 ENCOUNTER — Other Ambulatory Visit: Payer: Self-pay | Admitting: Family

## 2016-03-31 ENCOUNTER — Telehealth: Payer: Self-pay | Admitting: Internal Medicine

## 2016-03-31 NOTE — Telephone Encounter (Signed)
New message       Concerning the medical records from May of 2015-thur today    Fax number [phone]

## 2016-04-28 ENCOUNTER — Ambulatory Visit (HOSPITAL_BASED_OUTPATIENT_CLINIC_OR_DEPARTMENT_OTHER): Payer: 59

## 2016-04-28 ENCOUNTER — Encounter: Payer: Self-pay | Admitting: Family

## 2016-04-28 ENCOUNTER — Ambulatory Visit (HOSPITAL_BASED_OUTPATIENT_CLINIC_OR_DEPARTMENT_OTHER): Payer: 59 | Admitting: Family

## 2016-04-28 ENCOUNTER — Other Ambulatory Visit (HOSPITAL_BASED_OUTPATIENT_CLINIC_OR_DEPARTMENT_OTHER): Payer: 59

## 2016-04-28 VITALS — BP 102/67 | HR 75 | Temp 98.4°F | Resp 16 | Ht 71.0 in | Wt 235.0 lb

## 2016-04-28 VITALS — BP 100/67 | HR 64

## 2016-04-28 DIAGNOSIS — D45 Polycythemia vera: Secondary | ICD-10-CM

## 2016-04-28 DIAGNOSIS — E119 Type 2 diabetes mellitus without complications: Secondary | ICD-10-CM

## 2016-04-28 DIAGNOSIS — Z794 Long term (current) use of insulin: Secondary | ICD-10-CM

## 2016-04-28 LAB — CBC WITH DIFFERENTIAL (CANCER CENTER ONLY)
BASO#: 0.2 10*3/uL (ref 0.0–0.2)
BASO%: 2.8 % — ABNORMAL HIGH (ref 0.0–2.0)
EOS ABS: 0.3 10*3/uL (ref 0.0–0.5)
EOS%: 4.3 % (ref 0.0–7.0)
HEMATOCRIT: 43 % (ref 38.7–49.9)
HEMOGLOBIN: 13.6 g/dL (ref 13.0–17.1)
LYMPH#: 1.6 10*3/uL (ref 0.9–3.3)
LYMPH%: 23.5 % (ref 14.0–48.0)
MCH: 21.8 pg — AB (ref 28.0–33.4)
MCHC: 31.6 g/dL — AB (ref 32.0–35.9)
MCV: 69 fL — ABNORMAL LOW (ref 82–98)
MONO#: 0.6 10*3/uL (ref 0.1–0.9)
MONO%: 8.9 % (ref 0.0–13.0)
NEUT%: 60.5 % (ref 40.0–80.0)
NEUTROS ABS: 4.1 10*3/uL (ref 1.5–6.5)
Platelets: 211 10*3/uL (ref 145–400)
RBC: 6.24 10*6/uL — ABNORMAL HIGH (ref 4.20–5.70)
RDW: 20.2 % — ABNORMAL HIGH (ref 11.1–15.7)
WBC: 6.7 10*3/uL (ref 4.0–10.0)

## 2016-04-28 LAB — COMPREHENSIVE METABOLIC PANEL
ALBUMIN: 4 g/dL (ref 3.5–5.0)
ALK PHOS: 78 U/L (ref 40–150)
ALT: 17 U/L (ref 0–55)
AST: 22 U/L (ref 5–34)
Anion Gap: 10 mEq/L (ref 3–11)
BUN: 27.3 mg/dL — AB (ref 7.0–26.0)
CALCIUM: 9.4 mg/dL (ref 8.4–10.4)
CO2: 30 mEq/L — ABNORMAL HIGH (ref 22–29)
Chloride: 99 mEq/L (ref 98–109)
Creatinine: 1.5 mg/dL — ABNORMAL HIGH (ref 0.7–1.3)
EGFR: 49 mL/min/{1.73_m2} — ABNORMAL LOW (ref >90)
Glucose: 106 mg/dl (ref 70–140)
POTASSIUM: 3.6 meq/L (ref 3.5–5.1)
Sodium: 140 mEq/L (ref 136–145)
Total Bilirubin: 0.46 mg/dL (ref 0.20–1.20)
Total Protein: 7.2 g/dL (ref 6.4–8.3)

## 2016-04-28 LAB — IRON AND TIBC
%SAT: 9 % — AB (ref 20–55)
IRON: 40 ug/dL — AB (ref 42–163)
TIBC: 428 ug/dL — ABNORMAL HIGH (ref 202–409)
UIBC: 388 ug/dL — ABNORMAL HIGH (ref 117–376)

## 2016-04-28 LAB — FERRITIN: FERRITIN: 11 ng/mL — AB (ref 22–316)

## 2016-04-28 MED ORDER — SODIUM CHLORIDE 0.9 % IV SOLN
500.0000 mL | Freq: Once | INTRAVENOUS | Status: AC
  Administered 2016-04-28: 500 mL via INTRAVENOUS

## 2016-04-28 NOTE — Patient Instructions (Signed)
Therapeutic Phlebotomy, Care After  Refer to this sheet in the next few weeks. These instructions provide you with information about caring for yourself after your procedure. Your health care provider may also give you more specific instructions. Your treatment has been planned according to current medical practices, but problems sometimes occur. Call your health care provider if you have any problems or questions after your procedure.  WHAT TO EXPECT AFTER THE PROCEDURE  After your procedure, it is common to have:   Light-headedness or dizziness. You may feel faint.   Nausea.   Tiredness.  HOME CARE INSTRUCTIONS  Activities   Return to your normal activities as directed by your health care provider. Most people can go back to their normal activities right away.   Avoid strenuous physical activity and heavy lifting or pulling for about 5 hours after the procedure. Do not lift anything that is heavier than 10 lb (4.5 kg).   Athletes should avoid strenuous exercise for at least 12 hours.   Change positions slowly for the remainder of the day. This will help to prevent light-headedness or fainting.   If you feel light-headed, lie down until the feeling goes away.  Eating and Drinking   Be sure to eat well-balanced meals for the next 24 hours.   Drink enough fluid to keep your urine clear or pale yellow.   Avoid drinking alcohol on the day that you had the procedure.  Care of the Needle Insertion Site   Keep your bandage dry. You can remove the bandage after about 5 hours or as directed by your health care provider.   If you have bleeding from the needle insertion site, elevate your arm and press firmly on the site until the bleeding stops.   If you have bruising at the site, apply ice to the area:   Put ice in a plastic bag.   Place a towel between your skin and the bag.   Leave the ice on for 20 minutes, 2-3 times a day for the first 24 hours.   If the swelling does not go away after 24 hours, apply  a warm, moist washcloth to the area for 20 minutes, 2-3 times a day.  General Instructions   Avoid smoking for at least 30 minutes after the procedure.   Keep all follow-up visits as directed by your health care provider. It is important to continue with further therapeutic phlebotomy treatments as directed.  SEEK MEDICAL CARE IF:   You have redness, swelling, or pain at the needle insertion site.   You have fluid, blood, or pus coming from the needle insertion site.   You feel light-headed, dizzy, or nauseated, and the feeling does not go away.   You notice new bruising at the needle insertion site.   You feel weaker than normal.   You have a fever or chills.  SEEK IMMEDIATE MEDICAL CARE IF:   You have severe nausea or vomiting.   You have chest pain.   You have trouble breathing.    This information is not intended to replace advice given to you by your health care provider. Make sure you discuss any questions you have with your health care provider.    Document Released: 01/18/2011 Document Revised: 12/31/2014 Document Reviewed: 08/12/2014  Elsevier Interactive Patient Education 2016 [employer]

## 2016-04-28 NOTE — Progress Notes (Signed)
Hematology and Oncology Follow Up Visit  LAYLA GRAMM [id number] [birth year] 65 y.o. 04/28/2016   Principle Diagnosis:  Polycythemia vera - JAK2 NEGATIVE  Current Therapy:   Phlebotomy to maintain hematocrit below 40% Aspirin 81 mg by mouth daily   Interim History: Mr. Tafoya is back today for follow-up. He is feeling fatigued and has had some issues with dizziness. Thankfully, he has had no issue with falls or syncopal episodes. He is having hypotension off and on and plans to follow-up with cardiology this week regarding a possible change in medication. He has also had a few episodes of palpitations.  He is still on [name] and following the Atkin's diet. He has lost another 8 more lbs so far since his last visit. He would like to lose another 40 lbs if possible. His blood sugars are controlled on Trulicity.   No fever, chills, ice cravings, rash, itching, n/v, cough, headache, vision changes, SOB, chest pain, abdominal pain or changes in bowel or bladder habits.  He uses his CPAP consistently at night.  He has had some "puffiness" in his hands and takes his lasix daily. No tenderness in his extremities. 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 No c/o joint pain.   Medications:    Medication List       Accurate as of 04/28/16 11:26 AM. Always use your most recent med list.          ALPRAZolam 0.5 MG tablet Commonly known as:  XANAX TAKE ONE TABLET TWICE DAILY AS NEEDED FOR ANXIETY   amitriptyline 25 MG tablet Commonly known as:  ELAVIL Take 25 mg by mouth at bedtime.   ANDROGEL PUMP 20.25 MG/ACT (1.62%) Gel Generic drug:  Testosterone   aspirin 81 MG EC tablet Take 81 mg by mouth daily.   blood glucose meter kit and supplies Dispense based on patient and insurance preference. Use up to four times daily as directed. (FOR ICD-9 250.00, 250.01).   colchicine 0.6 MG tablet Commonly known as:  COLCRYS Take 1 tablet (0.6 mg total) by mouth as needed.   [name]  PO Take by mouth.   furosemide 40 MG tablet Commonly known as:  LASIX TAKE ONE TABLET EVERY DAY   hydrochlorothiazide 12.5 MG tablet Commonly known as:  HYDRODIURIL Take 12.5 mg by mouth daily.   hydrOXYzine 10 MG tablet Commonly known as:  ATARAX/VISTARIL TAKE ONE TABLET THREE TIMES DAILY AS NEEDED FOR ITCHING   ibuprofen 400 MG tablet Commonly known as:  ADVIL,MOTRIN Take 400 mg by mouth as needed.   metolazone 5 MG tablet Commonly known as:  ZAROXOLYN Take 5 mg by mouth as needed. Reported on 01/29/2016   modafinil 200 MG tablet Commonly known as:  PROVIGIL   multivitamin capsule Take 1 capsule by mouth daily.   naproxen 500 MG tablet Commonly known as:  NAPROSYN Take 250 mg by mouth as needed.   NON FORMULARY Take by mouth every morning. VITA-PULSE   ONE TOUCH ULTRA TEST test strip Generic drug:  glucose blood CHECK BLOOD GLUCOSE (SUGAR) UPTO 4 TIMESDAILY AS DIRECTED   ONETOUCH DELICA LANCETS FINE Misc CHECK BLOOD GLUCOSE (SUGAR) UPTO 4 TIMESDAILY AS DIRECTED   potassium chloride SA 20 MEQ tablet Commonly known as:  K-DUR,KLOR-CON Take 20 mEq by mouth once. Pt. States he takes when he uses Lasix   SYNTHROID 25 MCG tablet Generic drug:  levothyroxine Take 25 mcg by mouth daily.   TRULICITY 1.5 PY/0.9XI Sopn Generic drug:  Dulaglutide   valsartan 160 MG  tablet Commonly known as:  DIOVAN       Allergies: No Known Allergies  Past Medical History, Surgical history, Social history, and Family History were reviewed and updated.  Review of Systems: All other 10 point review of systems is negative.   Physical Exam:  height is _0  (1.803 m) and weight is 235 lb (106.6 kg). His oral temperature is 98.4 F (36.9 C). His blood pressure is 102/67 and his pulse is 75. His respiration is 16.   Wt Readings from Last 3 Encounters:  04/28/16 235 lb (106.6 kg)  01/29/16 243 lb (110.2 kg)  11/10/15 260 lb (117.9 kg)    Ocular: Sclerae unicteric, pupils  equal, round and reactive to light Ear-nose-throat: Oropharynx clear, dentition fair Lymphatic: No cervical supraclavicular or axillary adenopathy Lungs no rales or rhonchi, good excursion bilaterally Heart regular rate and rhythm, no murmur appreciated Abd soft, nontender, positive bowel sounds, no liver or spleen tip palpated on exam, no fluid wave MSK no focal spinal tenderness, no joint edema Neuro: non-focal, well-oriented, appropriate affect Breasts: Deferred  Lab Results  Component Value Date   WBC 6.7 04/28/2016   HGB 13.6 04/28/2016   HCT 43.0 04/28/2016   MCV 69 (L) 04/28/2016   PLT 211 04/28/2016   Lab Results  Component Value Date   FERRITIN 11 (L) 01/29/2016   IRON 25 (L) 01/29/2016   TIBC 431 (H) 01/29/2016   UIBC 406 (H) 01/29/2016   IRONPCTSAT 6 (L) 01/29/2016   Lab Results  Component Value Date   RETICCTPCT 1.8 08/08/2013   RBC 6.24 (H) 04/28/2016   RETICCTABS 93.6 08/08/2013   No results found for: KPAFRELGTCHN, LAMBDASER, KAPLAMBRATIO No results found for: IGGSERUM, IGA, IGMSERUM No results found for: Odetta Pink, SPEI   Chemistry      Component Value Date/Time   NA 140 01/29/2016 0841   K 3.7 01/29/2016 0841   CL 99 08/07/2015 1403   CL 102 10/14/2014 1146   CO2 26 01/29/2016 0841   BUN 31.6 (H) 01/29/2016 0841   CREATININE 1.8 (H) 01/29/2016 0841      Component Value Date/Time   CALCIUM 9.4 01/29/2016 0841   ALKPHOS 90 01/29/2016 0841   AST 27 01/29/2016 0841   ALT 37 01/29/2016 0841   BILITOT 0.39 01/29/2016 0841     Impression and Plan: Mr. Matos is 65 yo gentleman with polycythemia. He is symptomatic with fatigue and dizziness which may also be attributed to his low BP. He states that he will follow-up with cardiology today and inquire about an adjustment in his medication.  He has a Hct of 43.0 at this time and would like to be phlebotomized. We we will also give him replacement  fluids after and closely monitor his BP. We will plan to see him back once he returns from [place]. He will call us later today to make his appointment after he looks at his schedule.  He will contact our office with any questions or concerns. We can certainly see him back sooner if need be.  Eliezer Bottom, NP 8/30/201711:26 AM

## 2016-04-28 NOTE — Progress Notes (Signed)
Chase Gutierrez presents today for phlebotomy per MD orders. Phlebotomy procedure started at 1220 and ended at 1240. 500 grams removed. Patient observed for 30 minutes after procedure without any incident. Patient tolerated procedure well. IV needle removed intact.

## 2016-04-29 ENCOUNTER — Other Ambulatory Visit (HOSPITAL_COMMUNITY): Payer: 59

## 2016-04-29 ENCOUNTER — Other Ambulatory Visit: Payer: Self-pay | Admitting: *Deleted

## 2016-04-29 DIAGNOSIS — Z952 Presence of prosthetic heart valve: Secondary | ICD-10-CM

## 2016-04-29 LAB — HEMOGLOBIN A1C
Est. average glucose Bld gHb Est-mCnc: 114 mg/dL
Hemoglobin A1c: 5.6 % (ref 4.8–5.6)

## 2016-04-29 NOTE — Progress Notes (Signed)
Cardiology Office Note   Date:  04/30/2016   ID:  CAMP GOPAL, DOB [birth year], MRN [id number]  PCP:  Marton Redwood, MD  Cardiologist:   Dorris Carnes, MD   F/U of HTN and AV dz     History of Present Illness: Chase Gutierrez is a 65 y.o. male with a history of AS  He is s/p AVR in 2010.  Also a history of HTN, thoracic aortic aneurysm, distolic CHF, PCV, sleep apnea.  I saw hiim in May 2016   He comes back from [place] in Heme clinic  Labs drawn  Phlebotomy done on 8/30   Echo this AM    Denies CP Does saythat he has been on a diet this summer  Has lost 30 lbs with [name]  Does weigh himself daily  IF weight up a couple pounds with take lasix  Over the past few wks has used more than usual   Outpatient Medications Prior to Visit  Medication Sig Dispense Refill  . ALPRAZolam (XANAX) 0.5 MG tablet TAKE ONE TABLET TWICE DAILY AS NEEDED FOR ANXIETY 60 tablet 3  . amitriptyline (ELAVIL) 25 MG tablet Take 25 mg by mouth at bedtime.     . ANDROGEL PUMP 20.25 MG/ACT (1.62%) GEL     . aspirin 81 MG EC tablet Take 81 mg by mouth daily.    . blood glucose meter kit and supplies Dispense based on patient and insurance preference. Use up to four times daily as directed. (FOR ICD-9 250.00, 250.01). 1 each 0  . colchicine (COLCRYS) 0.6 MG tablet Take 1 tablet (0.6 mg total) by mouth as needed. 20 tablet 0  . furosemide (LASIX) 40 MG tablet TAKE ONE TABLET EVERY DAY (Patient taking differently: PRN) 30 tablet 6  . hydrochlorothiazide (HYDRODIURIL) 12.5 MG tablet Take 12.5 mg by mouth daily.      . hydrOXYzine (ATARAX/VISTARIL) 10 MG tablet TAKE ONE TABLET THREE TIMES DAILY AS NEEDED FOR ITCHING 90 tablet 2  . ibuprofen (ADVIL,MOTRIN) 400 MG tablet Take 400 mg by mouth as needed.      Marland Kitchen levothyroxine (SYNTHROID) 25 MCG tablet Take 25 mcg by mouth daily.      . modafinil (PROVIGIL) 200 MG tablet     . Multiple Vitamin (MULTIVITAMIN) capsule Take 1 capsule by mouth daily.      .  Naltrexone-Bupropion HCl ([name] PO) Take by mouth.    . naproxen (NAPROSYN) 500 MG tablet Take 250 mg by mouth as needed.     . NON FORMULARY Take by mouth every morning. VITA-PULSE    . ONE TOUCH ULTRA TEST test strip CHECK BLOOD GLUCOSE (SUGAR) UPTO 4 TIMESDAILY AS DIRECTED 100 each 3  . ONETOUCH DELICA LANCETS FINE MISC CHECK BLOOD GLUCOSE (SUGAR) UPTO 4 TIMESDAILY AS DIRECTED 100 each 6  . potassium chloride SA (K-DUR,KLOR-CON) 20 MEQ tablet Take 20 mEq by mouth once. Pt. States he takes when he uses Lasix    . TRULICITY 1.5 TG/6.2IR SOPN     . valsartan (DIOVAN) 160 MG tablet     . metolazone (ZAROXOLYN) 5 MG tablet Take 5 mg by mouth as needed. Reported on 01/29/2016     No facility-administered medications prior to visit.      Allergies:   Review of patient's allergies indicates no known allergies.   Past Medical History:  Diagnosis Date  . Adenomatous colon polyp    2005  . Allergic rhinitis   . Aortic stenosis   .  Depressed   . Dyslipidemia   . Hypertension   . Nephrolithiasis   . Obesity   . OSA (obstructive sleep apnea)   . Polycythemia vera(238.4) 06/16/2012  . Sinus complaint     Past Surgical History:  Procedure Laterality Date  . AORTIC VALVE REPLACEMENT  10/15/2008  . [place]  . TONSILLECTOMY  1982, 1993     Social History:  The patient  reports that he has never smoked. He has never used smokeless tobacco. He reports that he drinks alcohol.   Family History:  The patient's family history includes Coronary artery disease in his father and mother; Diabetes in his father; Lung cancer in his father.    ROS:  Please see the history of present illness. All other systems are reviewed and  Negative to the above problem except as noted.    PHYSICAL EXAM: VS:  BP 118/80   Pulse 88   Ht _0  (1.803 m)   Wt 238 lb (108 kg)   SpO2 96%   BMI 33.19 kg/m   GEN: Well nourished, well developed, in no acute distress  HEENT: normal  Neck: no  JVD, carotid bruits, or masses Cardiac: RRR; Gr II/VI sys murmur at base  , rubs, or gallops,  Tr edema in fingers   Respiratory:  clear to auscultation bilaterally, normal work of breathing GI: soft, nontender, nondistended, + BS  No hepatomegaly  MS: no deformity Moving all extremities   Skin: warm and dry, no rash Neuro:  Strength and sensation are intact Psych: euthymic mood, full affect   EKG:  EKG is ordered today.  SR 84 bpm  ST depression inferolaterally (old)   Lipid Panel    Component Value Date/Time   CHOL 154 01/08/2015 1210   TRIG 173 (H) 01/08/2015 1210   HDL 27 (L) 01/08/2015 1210   CHOLHDL 5.7 01/08/2015 1210   VLDL 35 01/08/2015 1210   LDLCALC 92 01/08/2015 1210   LDLDIRECT 101.0 07/01/2014 1102      Wt Readings from Last 3 Encounters:  04/30/16 238 lb (108 kg)  04/28/16 235 lb (106.6 kg)  01/29/16 243 lb (110.2 kg)      ASSESSMENT AND PLAN:  1  Fatigue  / hypotension  Pt with signif symptoms of fatigue  He has had this for a long time intermitt.  His BP is lower overall than he has been in the past  One potential cause is the increased use of lasix  His ventricle is relatively small/hyperdynamic and I think he does better with increased preload  Echo today shows mean gradient in 20s   It has been higher in past  (he has not taken lasix for severaldays) I have recomm that he not weigh himself daily and base lasix dosing on this  Instead he should waitfor more swelling or a more singif increase in wt. Call next week with respnse  2.  AV replacement  Valve is stable  Follow  3.  Aortic aneurysm.  Will need f/u CT  It has been stable  Defer to early next year  4.  HTN  As above  I would also recom he cut Valsartan to 80 mg   Follow BP         Signed, Dorris Carnes, MD  04/30/2016 [facility] [place], [place], [place]  [address] Phone: [phone]; Fax: [phone]

## 2016-04-30 ENCOUNTER — Ambulatory Visit (HOSPITAL_COMMUNITY): Payer: 59 | Attending: Internal Medicine

## 2016-04-30 ENCOUNTER — Encounter: Payer: Self-pay | Admitting: Internal Medicine

## 2016-04-30 ENCOUNTER — Ambulatory Visit: Payer: 59 | Admitting: Nurse Practitioner

## 2016-04-30 ENCOUNTER — Ambulatory Visit (INDEPENDENT_AMBULATORY_CARE_PROVIDER_SITE_OTHER): Payer: 59 | Admitting: Internal Medicine

## 2016-04-30 ENCOUNTER — Other Ambulatory Visit: Payer: Self-pay

## 2016-04-30 VITALS — BP 118/80 | HR 88 | Ht 71.0 in | Wt 238.0 lb

## 2016-04-30 DIAGNOSIS — I110 Hypertensive heart disease with heart failure: Secondary | ICD-10-CM | POA: Diagnosis not present

## 2016-04-30 DIAGNOSIS — Z6832 Body mass index (BMI) 32.0-32.9, adult: Secondary | ICD-10-CM | POA: Diagnosis not present

## 2016-04-30 DIAGNOSIS — I77810 Thoracic aortic ectasia: Secondary | ICD-10-CM | POA: Insufficient documentation

## 2016-04-30 DIAGNOSIS — Z952 Presence of prosthetic heart valve: Secondary | ICD-10-CM

## 2016-04-30 DIAGNOSIS — I10 Essential (primary) hypertension: Secondary | ICD-10-CM

## 2016-04-30 DIAGNOSIS — Z954 Presence of other heart-valve replacement: Secondary | ICD-10-CM | POA: Diagnosis not present

## 2016-04-30 DIAGNOSIS — E669 Obesity, unspecified: Secondary | ICD-10-CM | POA: Insufficient documentation

## 2016-04-30 DIAGNOSIS — G4733 Obstructive sleep apnea (adult) (pediatric): Secondary | ICD-10-CM | POA: Insufficient documentation

## 2016-04-30 DIAGNOSIS — Z953 Presence of xenogenic heart valve: Secondary | ICD-10-CM | POA: Insufficient documentation

## 2016-04-30 DIAGNOSIS — E785 Hyperlipidemia, unspecified: Secondary | ICD-10-CM | POA: Insufficient documentation

## 2016-04-30 DIAGNOSIS — E119 Type 2 diabetes mellitus without complications: Secondary | ICD-10-CM | POA: Diagnosis not present

## 2016-04-30 DIAGNOSIS — I509 Heart failure, unspecified: Secondary | ICD-10-CM | POA: Insufficient documentation

## 2016-04-30 DIAGNOSIS — I359 Nonrheumatic aortic valve disorder, unspecified: Secondary | ICD-10-CM | POA: Diagnosis present

## 2016-04-30 NOTE — Patient Instructions (Addendum)
Your physician recommends that you schedule a follow-up appointment in: as needed    Decrease lasix use , Cut back briefly on Diovan as you were dehydrated      Thank you for choosing [facility] !

## 2016-05-05 ENCOUNTER — Telehealth: Payer: Self-pay | Admitting: Internal Medicine

## 2016-05-05 ENCOUNTER — Other Ambulatory Visit: Payer: Self-pay | Admitting: Nurse Practitioner

## 2016-05-05 MED ORDER — ALPRAZOLAM 0.5 MG PO TABS: ORAL_TABLET | ORAL | 2 refills | Status: DC

## 2016-05-05 NOTE — Telephone Encounter (Signed)
Chase Gutierrez is returning your call.. Thanks  °

## 2016-05-05 NOTE — Telephone Encounter (Signed)
Spoke with patient.  Informed that letter is ready to be picked up.  He will come by on Monday to get it.  Advised him it will be waiting for him at front check in desk.

## 2016-08-06 ENCOUNTER — Ambulatory Visit (HOSPITAL_BASED_OUTPATIENT_CLINIC_OR_DEPARTMENT_OTHER): Payer: 59 | Admitting: Family

## 2016-08-06 ENCOUNTER — Other Ambulatory Visit (HOSPITAL_BASED_OUTPATIENT_CLINIC_OR_DEPARTMENT_OTHER): Payer: 59

## 2016-08-06 ENCOUNTER — Ambulatory Visit (HOSPITAL_BASED_OUTPATIENT_CLINIC_OR_DEPARTMENT_OTHER): Payer: 59

## 2016-08-06 VITALS — BP 140/95 | HR 80 | Resp 18

## 2016-08-06 VITALS — BP 146/92 | HR 88 | Temp 97.3°F | Resp 18 | Wt 239.0 lb

## 2016-08-06 DIAGNOSIS — D45 Polycythemia vera: Secondary | ICD-10-CM | POA: Diagnosis not present

## 2016-08-06 DIAGNOSIS — E119 Type 2 diabetes mellitus without complications: Secondary | ICD-10-CM

## 2016-08-06 LAB — COMPREHENSIVE METABOLIC PANEL
ALBUMIN: 3.9 g/dL (ref 3.5–5.0)
ALK PHOS: 94 U/L (ref 40–150)
ALT: 20 U/L (ref 0–55)
ANION GAP: 10 meq/L (ref 3–11)
AST: 20 U/L (ref 5–34)
BILIRUBIN TOTAL: 0.46 mg/dL (ref 0.20–1.20)
BUN: 18.9 mg/dL (ref 7.0–26.0)
CO2: 29 mEq/L (ref 22–29)
Calcium: 9.4 mg/dL (ref 8.4–10.4)
Chloride: 102 mEq/L (ref 98–109)
Creatinine: 1.3 mg/dL (ref 0.7–1.3)
EGFR: 58 mL/min/{1.73_m2} — AB (ref >90)
Glucose: 136 mg/dl (ref 70–140)
Potassium: 4.4 mEq/L (ref 3.5–5.1)
Sodium: 140 mEq/L (ref 136–145)
TOTAL PROTEIN: 7.5 g/dL (ref 6.4–8.3)

## 2016-08-06 LAB — CBC WITH DIFFERENTIAL (CANCER CENTER ONLY)
BASO#: 0.2 10*3/uL (ref 0.0–0.2)
BASO%: 2.5 % — ABNORMAL HIGH (ref 0.0–2.0)
EOS ABS: 0.3 10*3/uL (ref 0.0–0.5)
EOS%: 4 % (ref 0.0–7.0)
HCT: 44.3 % (ref 38.7–49.9)
HEMOGLOBIN: 14.4 g/dL (ref 13.0–17.1)
LYMPH#: 1.2 10*3/uL (ref 0.9–3.3)
LYMPH%: 15.4 % (ref 14.0–48.0)
MCH: 22.4 pg — AB (ref 28.0–33.4)
MCHC: 32.5 g/dL (ref 32.0–35.9)
MCV: 69 fL — AB (ref 82–98)
MONO#: 0.6 10*3/uL (ref 0.1–0.9)
MONO%: 7 % (ref 0.0–13.0)
NEUT#: 5.7 10*3/uL (ref 1.5–6.5)
NEUT%: 71.1 % (ref 40.0–80.0)
PLATELETS: 209 10*3/uL (ref 145–400)
RBC: 6.42 10*6/uL — AB (ref 4.20–5.70)
RDW: 19.6 % — ABNORMAL HIGH (ref 11.1–15.7)
WBC: 8 10*3/uL (ref 4.0–10.0)

## 2016-08-06 LAB — IRON AND TIBC
%SAT: 8 % — ABNORMAL LOW (ref 20–55)
IRON: 33 ug/dL — AB (ref 42–163)
TIBC: 429 ug/dL — AB (ref 202–409)
UIBC: 396 ug/dL — ABNORMAL HIGH (ref 117–376)

## 2016-08-06 LAB — FERRITIN: Ferritin: 9 ng/ml — ABNORMAL LOW (ref 22–316)

## 2016-08-06 MED ORDER — SODIUM CHLORIDE 0.9 % IV SOLN
INTRAVENOUS | Status: DC
  Administered 2016-08-06: 10:00:00 via INTRAVENOUS

## 2016-08-06 NOTE — Progress Notes (Signed)
Chase Gutierrez presents today for phlebotomy per MD orders. Phlebotomy procedure started at 0923 and ended at 0930. 500 grams removed via a 16 G to R AC. Patient observed for 30 minutes after procedure without any incident. Patient tolerated procedure well. IV needle removed intact.

## 2016-08-06 NOTE — Progress Notes (Signed)
Hematology and Oncology Follow Up Visit  Chase Gutierrez [id number] June 01, 1951 65 y.o. 08/06/2016   Principle Diagnosis:  Polycythemia vera - JAK2 NEGATIVE  Current Therapy:   Phlebotomy to maintain hematocrit below 40% Aspirin 81 mg by mouth daily   Interim History: Chase Gutierrez is back today for follow-up. He is doing well but has noticed that he is becoming fatigued, having "brain fog," headaches and increased joint pain.  His Hct today is up at 44.3. Iron studies are pending. He is chronically iron deficient due to phlebotomies. This would certainly contribute to the symptoms he is experiencing.  He verbalized that he is taking his 1 baby aspirin daily as prescribed.  He is still on [name] and following the [place]'s diet. He has gained 4 lbs since we last saw him and he states that his hands "feel puffy." He has not been taking his lasix and plans to restart.  His blood sugars are controlled on Trulicity.   No fever, chills, rash, itching, n/v, cough, headache, vision changes, SOB, chest pain, abdominal pain or changes in bowel or bladder habits.  He uses his CPAP consistently at night.  No tenderness in his extremities. 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   Medications:    Medication List       Accurate as of 08/06/16  8:49 AM. Always use your most recent med list.          ALPRAZolam 0.5 MG tablet Commonly known as:  XANAX TAKE ONE TABLET TWICE DAILY AS NEEDED FOR ANXIETY   amitriptyline 25 MG tablet Commonly known as:  ELAVIL Take 25 mg by mouth at bedtime.   ANDROGEL PUMP 20.25 MG/ACT (1.62%) Gel Generic drug:  Testosterone   aspirin 81 MG EC tablet Take 81 mg by mouth daily.   blood glucose meter kit and supplies Dispense based on patient and insurance preference. Use up to four times daily as directed. (FOR ICD-9 250.00, 250.01).   colchicine 0.6 MG tablet Commonly known as:  COLCRYS Take 1 tablet (0.6 mg total) by mouth as needed.   [name]  PO Take by mouth.   furosemide 40 MG tablet Commonly known as:  LASIX TAKE ONE TABLET EVERY DAY   hydrochlorothiazide 12.5 MG tablet Commonly known as:  HYDRODIURIL Take 12.5 mg by mouth daily.   hydrOXYzine 10 MG tablet Commonly known as:  ATARAX/VISTARIL TAKE ONE TABLET THREE TIMES DAILY AS NEEDED FOR ITCHING   ibuprofen 400 MG tablet Commonly known as:  ADVIL,MOTRIN Take 400 mg by mouth as needed.   modafinil 200 MG tablet Commonly known as:  PROVIGIL   multivitamin capsule Take 1 capsule by mouth daily.   naproxen 500 MG tablet Commonly known as:  NAPROSYN Take 250 mg by mouth as needed.   NON FORMULARY Take by mouth every morning. VITA-PULSE   ONE TOUCH ULTRA TEST test strip Generic drug:  glucose blood CHECK BLOOD GLUCOSE (SUGAR) UPTO 4 TIMESDAILY AS DIRECTED   ONETOUCH DELICA LANCETS FINE Misc CHECK BLOOD GLUCOSE (SUGAR) UPTO 4 TIMESDAILY AS DIRECTED   potassium chloride SA 20 MEQ tablet Commonly known as:  K-DUR,KLOR-CON Take 20 mEq by mouth once. Pt. States he takes when he uses Lasix   SYNTHROID 25 MCG tablet Generic drug:  levothyroxine Take 25 mcg by mouth daily.   TRULICITY 1.5 HQ/1.9XJ Sopn Generic drug:  Dulaglutide   valsartan 160 MG tablet Commonly known as:  DIOVAN       Allergies: No Known Allergies  Past Medical History,  Surgical history, Social history, and Family History were reviewed and updated.  Review of Systems: All other 10 point review of systems is negative.   Physical Exam:  weight is 239 lb (108.4 kg). His oral temperature is 97.3 F (36.3 C). His blood pressure is 146/92 (abnormal) and his pulse is 88. His respiration is 18.   Wt Readings from Last 3 Encounters:  08/06/16 239 lb (108.4 kg)  04/30/16 238 lb (108 kg)  04/28/16 235 lb (106.6 kg)    Ocular: Sclerae unicteric, pupils equal, round and reactive to light Ear-nose-throat: Oropharynx clear, dentition fair Lymphatic: No cervical supraclavicular or  axillary adenopathy Lungs no rales or rhonchi, good excursion bilaterally Heart regular rate and rhythm, no murmur appreciated Abd soft, nontender, positive bowel sounds, no liver or spleen tip palpated on exam, no fluid wave MSK no focal spinal tenderness, no joint edema Neuro: non-focal, well-oriented, appropriate affect Breasts: Deferred  Lab Results  Component Value Date   WBC 8.0 08/06/2016   HGB 14.4 08/06/2016   HCT 44.3 08/06/2016   MCV 69 (L) 08/06/2016   PLT 209 08/06/2016   Lab Results  Component Value Date   FERRITIN 11 (L) 04/28/2016   IRON 40 (L) 04/28/2016   TIBC 428 (H) 04/28/2016   UIBC 388 (H) 04/28/2016   IRONPCTSAT 9 (L) 04/28/2016   Lab Results  Component Value Date   RETICCTPCT 1.8 08/08/2013   RBC 6.42 (H) 08/06/2016   RETICCTABS 93.6 08/08/2013   No results found for: KPAFRELGTCHN, LAMBDASER, KAPLAMBRATIO No results found for: IGGSERUM, IGA, IGMSERUM No results found for: Kathrynn Ducking, MSPIKE, SPEI   Chemistry      Component Value Date/Time   NA 140 04/28/2016 1055   K 3.6 04/28/2016 1055   CL 99 08/07/2015 1403   CL 102 10/14/2014 1146   CO2 30 (H) 04/28/2016 1055   BUN 27.3 (H) 04/28/2016 1055   CREATININE 1.5 (H) 04/28/2016 1055      Component Value Date/Time   CALCIUM 9.4 04/28/2016 1055   ALKPHOS 78 04/28/2016 1055   AST 22 04/28/2016 1055   ALT 17 04/28/2016 1055   BILITOT 0.46 04/28/2016 1055     Impression and Plan: Chase Gutierrez is 65 yo gentleman with polycythemia. He is symptomatic with fatigue, "brain fog," headaches and increased joint pain. His Hct is 44.3.  We will phlebotomize him today and again next week. He also gets fluids after each phlebotomy to off set the volume loss.  He is chronically iron deficient which is certainly contributing to his symptoms.  We will plan to see him back in 3 months for repeat lab work and follow-up.  He will contact our office with any  questions or concerns. We can certainly see him back sooner if need be.  Eliezer Bottom, NP 12/8/20178:49 AM

## 2016-08-06 NOTE — Patient Instructions (Signed)
Therapeutic Phlebotomy, Care After  Refer to this sheet in the next few weeks. These instructions provide you with information about caring for yourself after your procedure. Your health care provider may also give you more specific instructions. Your treatment has been planned according to current medical practices, but problems sometimes occur. Call your health care provider if you have any problems or questions after your procedure.  WHAT TO EXPECT AFTER THE PROCEDURE  After your procedure, it is common to have:   Light-headedness or dizziness. You may feel faint.   Nausea.   Tiredness.  HOME CARE INSTRUCTIONS  Activities   Return to your normal activities as directed by your health care provider. Most people can go back to their normal activities right away.   Avoid strenuous physical activity and heavy lifting or pulling for about 5 hours after the procedure. Do not lift anything that is heavier than 10 lb (4.5 kg).   Athletes should avoid strenuous exercise for at least 12 hours.   Change positions slowly for the remainder of the day. This will help to prevent light-headedness or fainting.   If you feel light-headed, lie down until the feeling goes away.  Eating and Drinking   Be sure to eat well-balanced meals for the next 24 hours.   Drink enough fluid to keep your urine clear or pale yellow.   Avoid drinking alcohol on the day that you had the procedure.  Care of the Needle Insertion Site   Keep your bandage dry. You can remove the bandage after about 5 hours or as directed by your health care provider.   If you have bleeding from the needle insertion site, elevate your arm and press firmly on the site until the bleeding stops.   If you have bruising at the site, apply ice to the area:   Put ice in a plastic bag.   Place a towel between your skin and the bag.   Leave the ice on for 20 minutes, 2-3 times a day for the first 24 hours.   If the swelling does not go away after 24 hours, apply  a warm, moist washcloth to the area for 20 minutes, 2-3 times a day.  General Instructions   Avoid smoking for at least 30 minutes after the procedure.   Keep all follow-up visits as directed by your health care provider. It is important to continue with further therapeutic phlebotomy treatments as directed.  SEEK MEDICAL CARE IF:   You have redness, swelling, or pain at the needle insertion site.   You have fluid, blood, or pus coming from the needle insertion site.   You feel light-headed, dizzy, or nauseated, and the feeling does not go away.   You notice new bruising at the needle insertion site.   You feel weaker than normal.   You have a fever or chills.  SEEK IMMEDIATE MEDICAL CARE IF:   You have severe nausea or vomiting.   You have chest pain.   You have trouble breathing.    This information is not intended to replace advice given to you by your health care provider. Make sure you discuss any questions you have with your health care provider.    Document Released: 01/18/2011 Document Revised: 12/31/2014 Document Reviewed: 08/12/2014  Elsevier Interactive Patient Education 2016 [employer]

## 2016-08-13 ENCOUNTER — Ambulatory Visit (HOSPITAL_BASED_OUTPATIENT_CLINIC_OR_DEPARTMENT_OTHER): Payer: 59

## 2016-08-13 VITALS — BP 133/70 | HR 82 | Temp 98.0°F | Resp 18

## 2016-08-13 DIAGNOSIS — D45 Polycythemia vera: Secondary | ICD-10-CM | POA: Diagnosis not present

## 2016-08-13 MED ORDER — SODIUM CHLORIDE 0.9 % IV SOLN
500.0000 mL | Freq: Once | INTRAVENOUS | Status: AC
  Administered 2016-08-13: 500 mL via INTRAVENOUS

## 2016-08-13 NOTE — Patient Instructions (Signed)
Therapeutic Phlebotomy Therapeutic phlebotomy is the controlled removal of blood from a person's body for the purpose of treating a medical condition. The procedure is similar to donating blood. Usually, about a pint (470 mL, or 0.47L) of blood is removed. The average adult has 9-12 pints (4.3-5.7 L) of blood. Therapeutic phlebotomy may be used to treat the following medical conditions:  Hemochromatosis. This is a condition in which the blood contains too much iron.  Polycythemia vera. This is a condition in which the blood contains too many red blood cells.  Porphyria cutanea tarda. This is a disease in which an important part of hemoglobin is not made properly. It results in the buildup of abnormal amounts of porphyrins in the body.  Sickle cell disease. This is a condition in which the red blood cells form an abnormal crescent shape rather than a round shape. Tell a health care provider about:  Any allergies you have.  All medicines you are taking, including vitamins, herbs, eye drops, creams, and over-the-counter medicines.  Any problems you or family members have had with anesthetic medicines.  Any blood disorders you have.  Any surgeries you have had.  Any medical conditions you have. What are the risks? Generally, this is a safe procedure. However, problems may occur, including:  Nausea or light-headedness.  Low blood pressure.  Soreness, bleeding, swelling, or bruising at the needle insertion site.  Infection. What happens before the procedure?  Follow instructions from your health care provider about eating or drinking restrictions.  Ask your health care provider about changing or stopping your regular medicines. This is especially important if you are taking diabetes medicines or blood thinners.  Wear clothing with sleeves that can be raised above the elbow.  Plan to have someone take you home after the procedure.  You may have a blood sample taken. What happens  during the procedure?  A needle will be inserted into one of your veins.  Tubing and a collection bag will be attached to that needle.  Blood will flow through the needle and tubing into the collection bag.  You may be asked to open and close your hand slowly and continually during the entire collection.  After the specified amount of blood has been removed from your body, the collection bag and tubing will be clamped.  The needle will be removed from your vein.  Pressure will be held on the site of the needle insertion to stop the bleeding.  A bandage (dressing) will be placed over the needle insertion site. The procedure may vary among health care providers and hospitals. What happens after the procedure?  Your recovery will be assessed and monitored.  You can return to your normal activities as directed by your health care provider. This information is not intended to replace advice given to you by your health care provider. Make sure you discuss any questions you have with your health care provider. Document Released: 01/18/2011 Document Revised: 04/17/2016 Document Reviewed: 08/12/2014 Elsevier Interactive Patient Education  2017 Elsevier Inc.  

## 2016-08-19 ENCOUNTER — Encounter: Payer: Self-pay | Admitting: *Deleted

## 2016-08-19 ENCOUNTER — Encounter: Payer: Self-pay | Admitting: Hematology & Oncology

## 2016-09-21 ENCOUNTER — Telehealth: Payer: Self-pay | Admitting: *Deleted

## 2016-09-21 NOTE — Telephone Encounter (Signed)
Received call from patient with [facility] phone and address.  [facility] [place] at [address] [phone]  Phone number is [phone]

## 2016-09-22 ENCOUNTER — Other Ambulatory Visit: Payer: Self-pay | Admitting: Family

## 2016-09-29 ENCOUNTER — Telehealth: Payer: Self-pay | Admitting: Family

## 2016-09-29 ENCOUNTER — Encounter: Payer: Self-pay | Admitting: Hematology & Oncology

## 2016-09-29 NOTE — Telephone Encounter (Signed)
Spoke with Chase Gutierrez and went over his blood work with him that he had done yesterday in [place]. His Hct is 44.4 and he is symptomatic with fatigue and weakness. He also states that his complexion is [place]. Since he is not a candidate for donation with the red cross and also required fluids after phlebotomy he is searching for a hematologist's office where we can send orders. Hopefully, we can order a phlebotomy with replacement fluids to be done there in [facility]. He will let us know what he finds out tomorrow.

## 2016-10-06 ENCOUNTER — Telehealth: Payer: Self-pay | Admitting: *Deleted

## 2016-10-06 NOTE — Telephone Encounter (Signed)
Patient in [place] and needing management with his phlebotomies  Order faxed to [facility] for labs prn  [phone]  If Phlebotomy is needed, order will need to be sent to  [facility] [place] [phone]

## 2016-10-08 ENCOUNTER — Other Ambulatory Visit: Payer: Self-pay | Admitting: Hematology & Oncology

## 2016-10-21 ENCOUNTER — Encounter: Payer: Self-pay | Admitting: Hematology & Oncology

## 2016-11-04 DIAGNOSIS — R972 Elevated prostate specific antigen [PSA]: Secondary | ICD-10-CM | POA: Diagnosis not present

## 2016-11-04 DIAGNOSIS — N401 Enlarged prostate with lower urinary tract symptoms: Secondary | ICD-10-CM | POA: Diagnosis not present

## 2016-11-04 DIAGNOSIS — N529 Male erectile dysfunction, unspecified: Secondary | ICD-10-CM | POA: Diagnosis not present

## 2016-11-05 ENCOUNTER — Ambulatory Visit: Payer: 59 | Admitting: Hematology & Oncology

## 2016-11-05 ENCOUNTER — Other Ambulatory Visit: Payer: 59

## 2016-11-16 ENCOUNTER — Other Ambulatory Visit: Payer: Self-pay | Admitting: Hematology & Oncology

## 2016-12-06 DIAGNOSIS — N401 Enlarged prostate with lower urinary tract symptoms: Secondary | ICD-10-CM | POA: Diagnosis not present

## 2016-12-06 DIAGNOSIS — R972 Elevated prostate specific antigen [PSA]: Secondary | ICD-10-CM | POA: Diagnosis not present

## 2016-12-22 DIAGNOSIS — D45 Polycythemia vera: Secondary | ICD-10-CM | POA: Diagnosis not present

## 2016-12-27 ENCOUNTER — Other Ambulatory Visit: Payer: Self-pay | Admitting: Hematology & Oncology

## 2017-01-14 ENCOUNTER — Other Ambulatory Visit (HOSPITAL_BASED_OUTPATIENT_CLINIC_OR_DEPARTMENT_OTHER): Payer: Medicare Other

## 2017-01-14 ENCOUNTER — Ambulatory Visit (HOSPITAL_BASED_OUTPATIENT_CLINIC_OR_DEPARTMENT_OTHER): Payer: Medicare Other | Admitting: Family

## 2017-01-14 ENCOUNTER — Ambulatory Visit (HOSPITAL_BASED_OUTPATIENT_CLINIC_OR_DEPARTMENT_OTHER): Payer: Medicare Other

## 2017-01-14 VITALS — BP 140/95 | HR 82 | Temp 97.8°F | Resp 18 | Wt 245.0 lb

## 2017-01-14 VITALS — BP 127/70 | HR 75

## 2017-01-14 DIAGNOSIS — Z794 Long term (current) use of insulin: Secondary | ICD-10-CM

## 2017-01-14 DIAGNOSIS — D45 Polycythemia vera: Secondary | ICD-10-CM | POA: Diagnosis not present

## 2017-01-14 DIAGNOSIS — E119 Type 2 diabetes mellitus without complications: Secondary | ICD-10-CM

## 2017-01-14 DIAGNOSIS — D500 Iron deficiency anemia secondary to blood loss (chronic): Secondary | ICD-10-CM

## 2017-01-14 LAB — COMPREHENSIVE METABOLIC PANEL
ALBUMIN: 4.4 g/dL (ref 3.5–5.0)
ALK PHOS: 87 U/L (ref 40–150)
ALT: 29 U/L (ref 0–55)
AST: 26 U/L (ref 5–34)
Anion Gap: 11 mEq/L (ref 3–11)
BILIRUBIN TOTAL: 0.54 mg/dL (ref 0.20–1.20)
BUN: 22.2 mg/dL (ref 7.0–26.0)
CO2: 31 mEq/L — ABNORMAL HIGH (ref 22–29)
Calcium: 9.9 mg/dL (ref 8.4–10.4)
Chloride: 101 mEq/L (ref 98–109)
Creatinine: 1.5 mg/dL — ABNORMAL HIGH (ref 0.7–1.3)
EGFR: 49 mL/min/{1.73_m2} — ABNORMAL LOW (ref >90)
GLUCOSE: 139 mg/dL (ref 70–140)
POTASSIUM: 3.6 meq/L (ref 3.5–5.1)
SODIUM: 143 meq/L (ref 136–145)
TOTAL PROTEIN: 7.7 g/dL (ref 6.4–8.3)

## 2017-01-14 LAB — HEMOGLOBIN A1C
ESTIMATED AVERAGE GLUCOSE: 123 mg/dL
HEMOGLOBIN A1C: 5.9 % — AB (ref 4.8–5.6)

## 2017-01-14 LAB — CBC WITH DIFFERENTIAL (CANCER CENTER ONLY)
BASO#: 0.2 10*3/uL (ref 0.0–0.2)
BASO%: 2.9 % — AB (ref 0.0–2.0)
EOS%: 5 % (ref 0.0–7.0)
Eosinophils Absolute: 0.4 10*3/uL (ref 0.0–0.5)
HCT: 43.9 % (ref 38.7–49.9)
HEMOGLOBIN: 13.4 g/dL (ref 13.0–17.1)
LYMPH#: 1.9 10*3/uL (ref 0.9–3.3)
LYMPH%: 23.4 % (ref 14.0–48.0)
MCH: 20 pg — ABNORMAL LOW (ref 28.0–33.4)
MCHC: 30.5 g/dL — ABNORMAL LOW (ref 32.0–35.9)
MCV: 65 fL — ABNORMAL LOW (ref 82–98)
MONO#: 0.8 10*3/uL (ref 0.1–0.9)
MONO%: 9.8 % (ref 0.0–13.0)
NEUT%: 58.9 % (ref 40.0–80.0)
NEUTROS ABS: 4.8 10*3/uL (ref 1.5–6.5)
Platelets: 276 10*3/uL (ref 145–400)
RBC: 6.71 10*6/uL — AB (ref 4.20–5.70)
RDW: 21.6 % — ABNORMAL HIGH (ref 11.1–15.7)
WBC: 8.2 10*3/uL (ref 4.0–10.0)

## 2017-01-14 LAB — IRON AND TIBC
%SAT: 5 % — ABNORMAL LOW (ref 20–55)
Iron: 26 ug/dL — ABNORMAL LOW (ref 42–163)
TIBC: 475 ug/dL — ABNORMAL HIGH (ref 202–409)
UIBC: 449 ug/dL — ABNORMAL HIGH (ref 117–376)

## 2017-01-14 LAB — FERRITIN: FERRITIN: 8 ng/mL — AB (ref 22–316)

## 2017-01-14 MED ORDER — SODIUM CHLORIDE 0.9 % IV SOLN
1000.0000 mL | Freq: Once | INTRAVENOUS | Status: AC
  Administered 2017-01-14: 500 mL via INTRAVENOUS

## 2017-01-14 NOTE — Progress Notes (Signed)
Hematology and Oncology Follow Up Visit  Chase Gutierrez [id number] 01/27/1951 66 y.o. 01/14/2017   Principle Diagnosis:  Polycythemia vera - JAK2 NEGATIVE  Current Therapy:   Phlebotomy to maintain hematocrit below 40% Aspirin 81 mg by mouth daily   Interim History:  Chase Gutierrez is here today for follow-up and phlebotomy. He states that he is having increased joint aches and pains. He is also quite fatigued and napping several times a day. His Hct is 43. He is taking his baby aspirin daily.  No lymphadenopathy found on exam. No episodes of bleeding, bruising or petechiae.  No fever, chills, n/v, cough, rash, dizziness, headache, SOB, chest pain, palpitations, abdominal pain or changes in bowel or bladder habits.  No swelling or tenderness in his extremities at this time. Neuropathy is unchanged.  He has maintained a good appetite and is staying well hydrated. His weight is stable.  His blood glucose machine is not working so he plans to follow-up with his PCP for a new one. CMP today is pending. He is on Trulicity.   ECOG Performance Status: 1 - Symptomatic but completely ambulatory  Medications:  Allergies as of 01/14/2017   No Known Allergies     Medication List       Accurate as of 01/14/17 10:26 AM. Always use your most recent med list.          ALPRAZolam 0.5 MG tablet Commonly known as:  XANAX TAKE ONE TABLET TWICE DAILY AS NEEDED FOR ANXIETY   amitriptyline 25 MG tablet Commonly known as:  ELAVIL Take 25 mg by mouth at bedtime.   aspirin 81 MG EC tablet Take 81 mg by mouth daily.   blood glucose meter kit and supplies Dispense based on patient and insurance preference. Use up to four times daily as directed. (FOR ICD-9 250.00, 250.01).   colchicine 0.6 MG tablet Commonly known as:  COLCRYS Take 1 tablet (0.6 mg total) by mouth as needed.   [name] PO Take by mouth.   furosemide 40 MG tablet Commonly known as:  LASIX TAKE ONE TABLET EVERY DAY     hydrochlorothiazide 12.5 MG tablet Commonly known as:  HYDRODIURIL Take 12.5 mg by mouth daily.   hydrOXYzine 10 MG tablet Commonly known as:  ATARAX/VISTARIL TAKE ONE TABLET THREE TIMES DAILY AS NEEDED FOR ITCHING   ibuprofen 400 MG tablet Commonly known as:  ADVIL,MOTRIN Take 400 mg by mouth as needed.   modafinil 200 MG tablet Commonly known as:  PROVIGIL   multivitamin capsule Take 1 capsule by mouth daily.   naproxen 500 MG tablet Commonly known as:  NAPROSYN Take 250 mg by mouth as needed.   NON FORMULARY Take by mouth every morning. VITA-PULSE   ONE TOUCH ULTRA TEST test strip Generic drug:  glucose blood CHECK BLOOD GLUCOSE (SUGAR) UPTO 4 TIMESDAILY AS DIRECTED   ONETOUCH DELICA LANCETS FINE Misc CHECK BLOOD GLUCOSE (SUGAR) UPTO 4 TIMESDAILY AS DIRECTED   potassium chloride SA 20 MEQ tablet Commonly known as:  K-DUR,KLOR-CON Take 20 mEq by mouth once. Pt. States he takes when he uses Lasix   SYNTHROID 25 MCG tablet Generic drug:  levothyroxine Take 25 mcg by mouth daily.   TRULICITY 1.5 SF/6.8LE Sopn Generic drug:  Dulaglutide   valsartan 160 MG tablet Commonly known as:  DIOVAN       Allergies: No Known Allergies  Past Medical History, Surgical history, Social history, and Family History were reviewed and updated.  Review of Systems: All other 10  point review of systems is negative.   Physical Exam:  weight is 245 lb (111.1 kg). His oral temperature is 97.8 F (36.6 C). His blood pressure is 140/95 (abnormal) and his pulse is 82. His respiration is 18 and oxygen saturation is 98%.   Wt Readings from Last 3 Encounters:  01/14/17 245 lb (111.1 kg)  08/06/16 239 lb (108.4 kg)  04/30/16 238 lb (108 kg)    Ocular: Sclerae unicteric, pupils equal, round and reactive to light Ear-nose-throat: Oropharynx clear, dentition fair Lymphatic: No cervical, supraclavicular or axillary adenopathy Lungs no rales or rhonchi, good excursion  bilaterally Heart regular rate and rhythm, no murmur appreciated Abd soft, nontender, positive bowel sounds, no liver or spleen tip palpated on exam, no fluid wave  MSK no focal spinal tenderness, no joint edema Neuro: non-focal, well-oriented, appropriate affect Breasts: Deferred  Lab Results  Component Value Date   WBC 8.2 01/14/2017   HGB 13.4 01/14/2017   HCT 43.9 01/14/2017   MCV 65 (L) 01/14/2017   PLT 276 01/14/2017   Lab Results  Component Value Date   FERRITIN 9 (L) 08/06/2016   IRON 33 (L) 08/06/2016   TIBC 429 (H) 08/06/2016   UIBC 396 (H) 08/06/2016   IRONPCTSAT 8 (L) 08/06/2016   Lab Results  Component Value Date   RETICCTPCT 1.8 08/08/2013   RBC 6.71 (H) 01/14/2017   RETICCTABS 93.6 08/08/2013   No results found for: KPAFRELGTCHN, LAMBDASER, KAPLAMBRATIO No results found for: IGGSERUM, IGA, IGMSERUM No results found for: Odetta Pink, SPEI   Chemistry      Component Value Date/Time   NA 140 08/06/2016 0831   K 4.4 08/06/2016 0831   CL 99 08/07/2015 1403   CL 102 10/14/2014 1146   CO2 29 08/06/2016 0831   BUN 18.9 08/06/2016 0831   CREATININE 1.3 08/06/2016 0831      Component Value Date/Time   CALCIUM 9.4 08/06/2016 0831   ALKPHOS 94 08/06/2016 0831   AST 20 08/06/2016 0831   ALT 20 08/06/2016 0831   BILITOT 0.46 08/06/2016 0831      Impression and Plan: Chase Gutierrez is a pleasant 66 yo caucasian male with polycythemia, JAK2 negative. He is symptomatic at this time with increased fatigue and joint pain. He has not had a phlebotomy in 3 months.  His Hct is now 43.9 so we will proceed with phlebotomy and replacement fluids.  He will continue to take his baby aspirin daily.  We will plan to see him back in 3 months for repeat labs and follow-up.  He will contact our office with any questions or concerns. We can certainly see him sooner if need be.  He is now also followed by a hematologist in  [place] when he is there.   Eliezer Bottom, NP 5/18/201810:26 AM

## 2017-01-14 NOTE — Patient Instructions (Signed)
     Therapeutic Phlebotomy, Care After Refer to this sheet in the next few weeks. These instructions provide you with information about caring for yourself after your procedure. Your health care provider may also give you more specific instructions. Your treatment has been planned according to current medical practices, but problems sometimes occur. Call your health care provider if you have any problems or questions after your procedure. What can I expect after the procedure? After the procedure, it is common to have:  Light-headedness or dizziness. You may feel faint.  Nausea.  Tiredness. Follow these instructions at home: Activity  Return to your normal activities as directed by your health care provider. Most people can go back to their normal activities right away.  Avoid strenuous physical activity and heavy lifting or pulling for about 5 hours after the procedure. Do not lift anything that is heavier than 10 lb (4.5 kg).  Athletes should avoid strenuous exercise for at least 12 hours.  Change positions slowly for the remainder of the day. This will help to prevent light-headedness or fainting.  If you feel light-headed, lie down until the feeling goes away. Eating and drinking  Be sure to eat well-balanced meals for the next 24 hours.  Drink enough fluid to keep your urine clear or pale yellow.  Avoid drinking alcohol on the day that you had the procedure. Care of the Needle Insertion Site  Keep your bandage dry. You can remove the bandage after about 5 hours or as directed by your health care provider.  If you have bleeding from the needle insertion site, elevate your arm and press firmly on the site until the bleeding stops.  If you have bruising at the site, apply ice to the area:  Put ice in a plastic bag.  Place a towel between your skin and the bag.  Leave the ice on for 20 minutes, 2-3 times a day for the first 24 hours.  If the swelling does not go away  after 24 hours, apply a warm, moist washcloth to the area for 20 minutes, 2-3 times a day. General instructions  Avoid smoking for at least 30 minutes after the procedure.  Keep all follow-up visits as directed by your health care provider. It is important to continue with further therapeutic phlebotomy treatments as directed. Contact a health care provider if:  You have redness, swelling, or pain at the needle insertion site.  You have fluid, blood, or pus coming from the needle insertion site.  You feel light-headed, dizzy, or nauseated, and the feeling does not go away.  You notice new bruising at the needle insertion site.  You feel weaker than normal.  You have a fever or chills. Get help right away if:  You have severe nausea or vomiting.  You have chest pain.  You have trouble breathing. This information is not intended to replace advice given to you by your health care provider. Make sure you discuss any questions you have with your health care provider. Document Released: 01/18/2011 Document Revised: 04/17/2016 Document Reviewed: 08/12/2014 Elsevier Interactive Patient Education  2017 Elsevier Inc.  

## 2017-01-28 ENCOUNTER — Other Ambulatory Visit: Payer: Self-pay | Admitting: Hematology & Oncology

## 2017-02-23 DIAGNOSIS — D45 Polycythemia vera: Secondary | ICD-10-CM | POA: Diagnosis not present

## 2017-03-07 DIAGNOSIS — N209 Urinary calculus, unspecified: Secondary | ICD-10-CM | POA: Diagnosis not present

## 2017-03-07 DIAGNOSIS — R972 Elevated prostate specific antigen [PSA]: Secondary | ICD-10-CM | POA: Diagnosis not present

## 2017-03-07 DIAGNOSIS — E291 Testicular hypofunction: Secondary | ICD-10-CM | POA: Diagnosis not present

## 2017-03-10 ENCOUNTER — Other Ambulatory Visit: Payer: Self-pay | Admitting: Hematology & Oncology

## 2017-03-23 DIAGNOSIS — D45 Polycythemia vera: Secondary | ICD-10-CM | POA: Diagnosis not present

## 2017-04-20 DIAGNOSIS — D45 Polycythemia vera: Secondary | ICD-10-CM | POA: Diagnosis not present

## 2017-04-25 DIAGNOSIS — D751 Secondary polycythemia: Secondary | ICD-10-CM | POA: Diagnosis not present

## 2017-04-25 DIAGNOSIS — D45 Polycythemia vera: Secondary | ICD-10-CM | POA: Diagnosis not present

## 2017-05-27 ENCOUNTER — Telehealth: Payer: Self-pay | Admitting: Internal Medicine

## 2017-05-27 DIAGNOSIS — I712 Thoracic aortic aneurysm, without rupture, unspecified: Secondary | ICD-10-CM

## 2017-05-27 DIAGNOSIS — I359 Nonrheumatic aortic valve disorder, unspecified: Secondary | ICD-10-CM

## 2017-05-27 NOTE — Telephone Encounter (Signed)
I saw pt yesterday  He is visitng [place] for 1 month He wil need echo for AV replacement He will also need CT of chest to look at aorta   Last CT in Dec 2016   I will see pt in clnic on 10/12 if possible.

## 2017-05-30 ENCOUNTER — Ambulatory Visit: Payer: 59 | Admitting: Hematology & Oncology

## 2017-05-30 ENCOUNTER — Other Ambulatory Visit: Payer: 59

## 2017-05-30 DIAGNOSIS — I10 Essential (primary) hypertension: Secondary | ICD-10-CM | POA: Diagnosis not present

## 2017-05-30 DIAGNOSIS — E1149 Type 2 diabetes mellitus with other diabetic neurological complication: Secondary | ICD-10-CM | POA: Diagnosis not present

## 2017-05-30 DIAGNOSIS — I5032 Chronic diastolic (congestive) heart failure: Secondary | ICD-10-CM | POA: Diagnosis not present

## 2017-05-30 DIAGNOSIS — D751 Secondary polycythemia: Secondary | ICD-10-CM | POA: Diagnosis not present

## 2017-05-30 DIAGNOSIS — R972 Elevated prostate specific antigen [PSA]: Secondary | ICD-10-CM | POA: Diagnosis not present

## 2017-05-30 DIAGNOSIS — Z1389 Encounter for screening for other disorder: Secondary | ICD-10-CM | POA: Diagnosis not present

## 2017-05-30 DIAGNOSIS — E7849 Other hyperlipidemia: Secondary | ICD-10-CM | POA: Diagnosis not present

## 2017-05-30 DIAGNOSIS — Z6835 Body mass index (BMI) 35.0-35.9, adult: Secondary | ICD-10-CM | POA: Diagnosis not present

## 2017-05-30 DIAGNOSIS — G4733 Obstructive sleep apnea (adult) (pediatric): Secondary | ICD-10-CM | POA: Diagnosis not present

## 2017-05-30 DIAGNOSIS — E291 Testicular hypofunction: Secondary | ICD-10-CM | POA: Diagnosis not present

## 2017-05-30 DIAGNOSIS — Z23 Encounter for immunization: Secondary | ICD-10-CM | POA: Diagnosis not present

## 2017-05-30 NOTE — Telephone Encounter (Signed)
Orders have been placed for echo and ct aorta. Waiting for scheduling to occur.   I called patient and left detailed voice mail that he is scheduled for ov with Dr. Harrington Challenger on 06/10/17 at 10:00 am.

## 2017-06-03 NOTE — Addendum Note (Signed)
Addended by: Rodman Key on: 06/03/2017 09:24 AM   Modules accepted: Orders

## 2017-06-03 NOTE — Telephone Encounter (Signed)
Scheduled patient for echo Monday 2pm (10/9).  Will have patient arrive by 1:30 pm to have labs (BMET) done before CT scan at 4pm.  Spoke with patient who is aware of this plan.  He will be sure to request labs be the first appointment so they will be back by 4pm.

## 2017-06-06 ENCOUNTER — Ambulatory Visit
Admission: RE | Admit: 2017-06-06 | Discharge: 2017-06-06 | Disposition: A | Discharge status: Home / Self Care | Payer: Medicare Other | Source: Ambulatory Visit | Attending: Internal Medicine | Admitting: Internal Medicine

## 2017-06-06 ENCOUNTER — Other Ambulatory Visit: Payer: Medicare Other | Admitting: *Deleted

## 2017-06-06 ENCOUNTER — Other Ambulatory Visit: Payer: Self-pay

## 2017-06-06 ENCOUNTER — Ambulatory Visit (HOSPITAL_COMMUNITY): Payer: Medicare Other | Attending: Internal Medicine

## 2017-06-06 DIAGNOSIS — I712 Thoracic aortic aneurysm, without rupture, unspecified: Secondary | ICD-10-CM

## 2017-06-06 DIAGNOSIS — I313 Pericardial effusion (noninflammatory): Secondary | ICD-10-CM | POA: Insufficient documentation

## 2017-06-06 DIAGNOSIS — I359 Nonrheumatic aortic valve disorder, unspecified: Secondary | ICD-10-CM

## 2017-06-06 LAB — BASIC METABOLIC PANEL
BUN/Creatinine Ratio: 18 (ref 10–24)
BUN: 20 mg/dL (ref 8–27)
CALCIUM: 9.5 mg/dL (ref 8.6–10.2)
CO2: 29 mmol/L (ref 20–29)
CREATININE: 1.13 mg/dL (ref 0.76–1.27)
Chloride: 103 mmol/L (ref 96–106)
GFR calc Af Amer: 79 mL/min/{1.73_m2} (ref >59)
GFR, EST NON AFRICAN AMERICAN: 68 mL/min/{1.73_m2} (ref >59)
Glucose: 158 mg/dL — ABNORMAL HIGH (ref 65–99)
Potassium: 3.4 mmol/L — ABNORMAL LOW (ref 3.5–5.2)
SODIUM: 141 mmol/L (ref 134–144)

## 2017-06-06 MED ORDER — PERFLUTREN LIPID MICROSPHERE
1.0000 mL | INTRAVENOUS | Status: AC | PRN
Start: 2017-06-06 — End: 2017-06-06
  Administered 2017-06-06: 2 mL via INTRAVENOUS

## 2017-06-06 MED ORDER — IOPAMIDOL (ISOVUE-370) INJECTION 76%: 100.0000 mL | Freq: Once | INTRAVENOUS | Status: DC | PRN

## 2017-06-09 ENCOUNTER — Ambulatory Visit (HOSPITAL_BASED_OUTPATIENT_CLINIC_OR_DEPARTMENT_OTHER): Payer: Medicare Other | Admitting: Hematology & Oncology

## 2017-06-09 ENCOUNTER — Other Ambulatory Visit (HOSPITAL_BASED_OUTPATIENT_CLINIC_OR_DEPARTMENT_OTHER): Payer: Medicare Other

## 2017-06-09 ENCOUNTER — Ambulatory Visit (HOSPITAL_BASED_OUTPATIENT_CLINIC_OR_DEPARTMENT_OTHER): Payer: Medicare Other

## 2017-06-09 VITALS — BP 110/71 | HR 72 | Temp 97.7°F | Resp 18

## 2017-06-09 VITALS — BP 115/92 | HR 80 | Temp 97.8°F | Resp 20 | Wt 251.0 lb

## 2017-06-09 DIAGNOSIS — D45 Polycythemia vera: Secondary | ICD-10-CM

## 2017-06-09 DIAGNOSIS — D500 Iron deficiency anemia secondary to blood loss (chronic): Secondary | ICD-10-CM

## 2017-06-09 LAB — CBC WITH DIFFERENTIAL (CANCER CENTER ONLY)
BASO#: 0.3 10*3/uL — AB (ref 0.0–0.2)
BASO%: 3.7 % — ABNORMAL HIGH (ref 0.0–2.0)
EOS ABS: 0.4 10*3/uL (ref 0.0–0.5)
EOS%: 5.1 % (ref 0.0–7.0)
HEMATOCRIT: 43.5 % (ref 38.7–49.9)
HEMOGLOBIN: 13.8 g/dL (ref 13.0–17.1)
LYMPH#: 1.9 10*3/uL (ref 0.9–3.3)
LYMPH%: 23.4 % (ref 14.0–48.0)
MCH: 21.4 pg — AB (ref 28.0–33.4)
MCHC: 31.7 g/dL — AB (ref 32.0–35.9)
MCV: 68 fL — AB (ref 82–98)
MONO#: 0.7 10*3/uL (ref 0.1–0.9)
MONO%: 8.1 % (ref 0.0–13.0)
NEUT%: 59.7 % (ref 40.0–80.0)
NEUTROS ABS: 4.8 10*3/uL (ref 1.5–6.5)
Platelets: 175 10*3/uL (ref 145–400)
RBC: 6.44 10*6/uL — ABNORMAL HIGH (ref 4.20–5.70)
RDW: 20.2 % — AB (ref 11.1–15.7)
WBC: 8.1 10*3/uL (ref 4.0–10.0)

## 2017-06-09 LAB — IRON AND TIBC
%SAT: 7 % — AB (ref 20–55)
IRON: 27 ug/dL — AB (ref 42–163)
TIBC: 414 ug/dL — AB (ref 202–409)
UIBC: 387 ug/dL — AB (ref 117–376)

## 2017-06-09 LAB — FERRITIN: FERRITIN: 9 ng/mL — AB (ref 22–316)

## 2017-06-09 LAB — CMP (CANCER CENTER ONLY)
ALT(SGPT): 34 U/L (ref 10–47)
AST: 30 U/L (ref 11–38)
Albumin: 3.7 g/dL (ref 3.3–5.5)
Alkaline Phosphatase: 111 U/L — ABNORMAL HIGH (ref 26–84)
BUN: 22 mg/dL (ref 7–22)
CHLORIDE: 102 meq/L (ref 98–108)
CO2: 30 meq/L (ref 18–33)
Calcium: 9.1 mg/dL (ref 8.0–10.3)
Creat: 1.5 mg/dl — ABNORMAL HIGH (ref 0.6–1.2)
GLUCOSE: 125 mg/dL — AB (ref 73–118)
POTASSIUM: 3.5 meq/L (ref 3.3–4.7)
Sodium: 142 mEq/L (ref 128–145)
TOTAL PROTEIN: 6.8 g/dL (ref 6.4–8.1)
Total Bilirubin: 0.6 mg/dl (ref 0.20–1.60)

## 2017-06-09 MED ORDER — SODIUM CHLORIDE 0.9 % IV SOLN
500.0000 mL | Freq: Once | INTRAVENOUS | Status: AC
Start: 2017-06-09 — End: 2017-06-09
  Administered 2017-06-09: 500 mL via INTRAVENOUS

## 2017-06-09 NOTE — Progress Notes (Signed)
Hematology and Oncology Follow Up Visit  Chase Gutierrez [id number] 02/07/1951 66 y.o. 06/09/2017   Principle Diagnosis:  Polycythemia vera - JAK2 NEGATIVE  Current Therapy:   Phlebotomy to maintain hematocrit below 40% Aspirin 81 mg by mouth daily   Interim History:  Chase Gutierrez is here today for follow-up and phlebotomy. As always, he comes in with his wife. He is getting ready to go to another [employer]. He is a Product manager.  He is also given ready to go down to [place]. He has a house down in [facility]. He will be down there for a month or so.  Unfortunately, he has been having problems trying to find a hematologist down there who will phlebotomize him. He only that the hematologist that he saw was incredibly arrogant and condescending. He did not believe that Chase Gutierrez had polycythemia.  It is apparent, to me, that Chase Gutierrez does indeed have polycythemia. He was though he is JAK2 negative, I think that his polycythemia can be based on the fact that his erythropoietin level is only 7.  Symptomatically, he has improved dramatically since we started to phlebotomize him several years ago.  Chase Gutierrez is incredibly thankful that we are able to take care of him and allow him to have a better quality of life.  He has had no fever. He's had no cough. He's had no rashes. He's had no change in bowel or bladder habits.  Overall, his performance status is ECOG 1.   Medications:  Allergies as of 06/09/2017   No Known Allergies     Medication List       Accurate as of 06/09/17 12:56 PM. Always use your most recent med list.          ALPRAZolam 0.5 MG tablet Commonly known as:  XANAX TAKE ONE TABLET TWICE DAILY AS NEEDED FOR ANXIETY   amitriptyline 25 MG tablet Commonly known as:  ELAVIL Take 25 mg by mouth at bedtime.   aspirin 81 MG EC tablet Take 81 mg by mouth daily.   blood glucose meter kit and supplies Dispense based on patient and  insurance preference. Use up to four times daily as directed. (FOR ICD-9 250.00, 250.01).   colchicine 0.6 MG tablet Commonly known as:  COLCRYS Take 1 tablet (0.6 mg total) by mouth as needed.   [name] PO Take by mouth 2 (two) times daily.   furosemide 40 MG tablet Commonly known as:  LASIX TAKE ONE TABLET EVERY DAY   hydrochlorothiazide 12.5 MG tablet Commonly known as:  HYDRODIURIL Take 12.5 mg by mouth daily.   hydrOXYzine 10 MG tablet Commonly known as:  ATARAX/VISTARIL TAKE ONE TABLET THREE TIMES DAILY AS NEEDED FOR ITCHING   ibuprofen 400 MG tablet Commonly known as:  ADVIL,MOTRIN Take 400 mg by mouth as needed.   modafinil 200 MG tablet Commonly known as:  PROVIGIL   multivitamin capsule Take 1 capsule by mouth daily.   naproxen 500 MG tablet Commonly known as:  NAPROSYN Take 250 mg by mouth as needed.   NON FORMULARY Take by mouth every morning. VITA-PULSE   ONE TOUCH ULTRA TEST test strip Generic drug:  glucose blood CHECK BLOOD GLUCOSE (SUGAR) UPTO 4 TIMESDAILY AS DIRECTED   ONETOUCH DELICA LANCETS FINE Misc CHECK BLOOD GLUCOSE (SUGAR) UPTO 4 TIMESDAILY AS DIRECTED   potassium chloride SA 20 MEQ tablet Commonly known as:  K-DUR,KLOR-CON Take 20 mEq by mouth once. Pt. States he takes when he uses Lasix  SYNTHROID 25 MCG tablet Generic drug:  levothyroxine Take 25 mcg by mouth daily.   TRULICITY 1.5 EH/2.0NO Sopn Generic drug:  Dulaglutide   valsartan 160 MG tablet Commonly known as:  DIOVAN Take 80 mg by mouth daily. Pt states takes 80 mg "when stressed"       Allergies: No Known Allergies  Past Medical History, Surgical history, Social history, and Family History were reviewed and updated.  Review of Systems: As stated in the interim history  Physical Exam:  weight is 251 lb (113.9 kg). His oral temperature is 97.8 F (36.6 C). His blood pressure is 115/92 (abnormal) and his pulse is 80. His respiration is 20 and oxygen saturation  is 98%.   Wt Readings from Last 3 Encounters:  06/09/17 251 lb (113.9 kg)  01/14/17 245 lb (111.1 kg)  08/06/16 239 lb (108.4 kg)    Moderately obese white male. Head and neck exam shows no ocular or oral lesions. He has no conjunctival inflammation. He has no facial plethora. There is no adenopathy on his neck. Lungs are clear bilaterally. Cardiac exam regular rate and rhythm. He does have a 2/6 systolic ejection murmur. Abdomen is soft. He is obese. He has good bowel sounds. There is no fluid wave. There is no palpable liver or spleen tip. Back exam shows no tenderness over the spine, ribs or hips. Extremities shows no clubbing, cyanosis or edema. Has good range of motion of his joints. Skin exam shows no rashes, ecchymoses or petechia. Neurological exam shows no focal neurological deficits.  Lab Results  Component Value Date   WBC 8.1 06/09/2017   HGB 13.8 06/09/2017   HCT 43.5 06/09/2017   MCV 68 (L) 06/09/2017   PLT 175 06/09/2017   Lab Results  Component Value Date   FERRITIN 9 (L) 06/09/2017   IRON 27 (L) 06/09/2017   TIBC 414 (H) 06/09/2017   UIBC 387 (H) 06/09/2017   IRONPCTSAT 7 (L) 06/09/2017   Lab Results  Component Value Date   RETICCTPCT 1.8 08/08/2013   RBC 6.44 (H) 06/09/2017   RETICCTABS 93.6 08/08/2013   No results found for: KPAFRELGTCHN, LAMBDASER, KAPLAMBRATIO No results found for: IGGSERUM, IGA, IGMSERUM No results found for: Odetta Pink, SPEI   Chemistry      Component Value Date/Time   NA 142 06/09/2017 0949   NA 143 01/14/2017 0846   K 3.5 06/09/2017 0949   K 3.6 01/14/2017 0846   CL 102 06/09/2017 0949   CO2 30 06/09/2017 0949   CO2 31 (H) 01/14/2017 0846   BUN 22 06/09/2017 0949   BUN 22.2 01/14/2017 0846   CREATININE 1.5 (H) 06/09/2017 0949   CREATININE 1.5 (H) 01/14/2017 0846      Component Value Date/Time   CALCIUM 9.1 06/09/2017 0949   CALCIUM 9.9 01/14/2017 0846   ALKPHOS 111  (H) 06/09/2017 0949   ALKPHOS 87 01/14/2017 0846   AST 30 06/09/2017 0949   AST 26 01/14/2017 0846   ALT 34 06/09/2017 0949   ALT 29 01/14/2017 0846   BILITOT 0.60 06/09/2017 0949   BILITOT 0.54 01/14/2017 0846      Impression and Plan: Chase Gutierrez is a pleasant 66 year old white male. He has polycythemia. We keep his hematocrit below 40%.  We will go ahead and phlebotomize him today.  Again, I have no doubt that he has polycythemia.  We will plan to get him back when he comes back from [place]. He might be  back in another month or so.  Hopefully, he will be able to find a hematologist down in [place] to help them out so that he has a good quality of life and will be able to play in his bridge tournaments. This really helps his mental state and increases his cognitive awareness.    Volanda Napoleon, MD 10/11/201812:56 PM this

## 2017-06-09 NOTE — Progress Notes (Signed)
1 unit phlebotomy performed using a 16 gauge phlebotomy set. Phlebotomy needle clotted before phlebotomy could be completed. 325 mL collected. Phlebotomy restarted at 1140 with a 18 gauge IV catheter right forearm.175 ml Collected in 5 min.  Normal saline infused over 30 minutes. Patient tolerated well. Nourishment provided.

## 2017-06-09 NOTE — Patient Instructions (Signed)
Therapeutic Phlebotomy Therapeutic phlebotomy is the controlled removal of blood from a person's body for the purpose of treating a medical condition. The procedure is similar to donating blood. Usually, about a pint (470 mL, or 0.47L) of blood is removed. The average adult has 9-12 pints (4.3-5.7 L) of blood. Therapeutic phlebotomy may be used to treat the following medical conditions:  Hemochromatosis. This is a condition in which the blood contains too much iron.  Polycythemia vera. This is a condition in which the blood contains too many red blood cells.  Porphyria cutanea tarda. This is a disease in which an important part of hemoglobin is not made properly. It results in the buildup of abnormal amounts of porphyrins in the body.  Sickle cell disease. This is a condition in which the red blood cells form an abnormal crescent shape rather than a round shape.  Tell a health care provider about:  Any allergies you have.  All medicines you are taking, including vitamins, herbs, eye drops, creams, and over-the-counter medicines.  Any problems you or family members have had with anesthetic medicines.  Any blood disorders you have.  Any surgeries you have had.  Any medical conditions you have. What are the risks? Generally, this is a safe procedure. However, problems may occur, including:  Nausea or light-headedness.  Low blood pressure.  Soreness, bleeding, swelling, or bruising at the needle insertion site.  Infection.  What happens before the procedure?  Follow instructions from your health care provider about eating or drinking restrictions.  Ask your health care provider about changing or stopping your regular medicines. This is especially important if you are taking diabetes medicines or blood thinners.  Wear clothing with sleeves that can be raised above the elbow.  Plan to have someone take you home after the procedure.  You may have a blood sample taken. What  happens during the procedure?  A needle will be inserted into one of your veins.  Tubing and a collection bag will be attached to that needle.  Blood will flow through the needle and tubing into the collection bag.  You may be asked to open and close your hand slowly and continually during the entire collection.  After the specified amount of blood has been removed from your body, the collection bag and tubing will be clamped.  The needle will be removed from your vein.  Pressure will be held on the site of the needle insertion to stop the bleeding.  A bandage (dressing) will be placed over the needle insertion site. The procedure may vary among health care providers and hospitals. What happens after the procedure?  Your recovery will be assessed and monitored.  You can return to your normal activities as directed by your health care provider. This information is not intended to replace advice given to you by your health care provider. Make sure you discuss any questions you have with your health care provider. Document Released: 01/18/2011 Document Revised: 04/17/2016 Document Reviewed: 08/12/2014 Elsevier Interactive Patient Education  2018 Elsevier Inc.  

## 2017-06-10 ENCOUNTER — Ambulatory Visit (INDEPENDENT_AMBULATORY_CARE_PROVIDER_SITE_OTHER)
Admission: RE | Admit: 2017-06-10 | Discharge: 2017-06-10 | Disposition: A | Discharge status: Home / Self Care | Payer: Medicare Other | Source: Ambulatory Visit | Attending: Internal Medicine | Admitting: Internal Medicine

## 2017-06-10 ENCOUNTER — Ambulatory Visit (INDEPENDENT_AMBULATORY_CARE_PROVIDER_SITE_OTHER): Payer: Medicare Other | Admitting: Internal Medicine

## 2017-06-10 VITALS — BP 108/80 | HR 73 | Wt 256.6 lb

## 2017-06-10 DIAGNOSIS — Z952 Presence of prosthetic heart valve: Secondary | ICD-10-CM

## 2017-06-10 DIAGNOSIS — I10 Essential (primary) hypertension: Secondary | ICD-10-CM

## 2017-06-10 DIAGNOSIS — I712 Thoracic aortic aneurysm, without rupture: Secondary | ICD-10-CM

## 2017-06-10 MED ORDER — IOPAMIDOL (ISOVUE-370) INJECTION 76%
100.0000 mL | Freq: Once | INTRAVENOUS | Status: AC | PRN
  Administered 2017-06-10: 100 mL via INTRAVENOUS

## 2017-06-10 NOTE — Patient Instructions (Signed)
Your physician recommends that you continue on your current medications as directed. Please refer to the Current Medication list given to you today.     

## 2017-06-12 NOTE — Progress Notes (Signed)
Cardiology Office Note   Date:  06/12/2017   ID:  Chase Gutierrez, DOB May 18, 1951, MRN [id number]  PCP:  Marton Redwood, MD  Cardiologist:   Dorris Carnes, MD   F/U of HTN and AV dz     History of Present Illness: Chase Gutierrez is a 66 y.o. male with a history of AS  He is s/p AVR in 2010.  Also a history of HTN, thoracic aortic aneurysm, distolic CHF, PCV, sleep apnea.  I saw hiim in September 2017 He is back in [place] for a few wks  Usually in [facility]  The patient denies dizziness  No CP  Does not exercise regularly, esp this past summer due to heat. Denies signif SOB with activtiy  He was just seen by [facility] yesterday and heme clinic  Underwent phlebotomy    Says he feels better after this is done    Outpatient Medications Prior to Visit  Medication Sig Dispense Refill  . ALPRAZolam (XANAX) 0.5 MG tablet TAKE ONE TABLET TWICE DAILY AS NEEDED FOR ANXIETY 60 tablet 0  . amitriptyline (ELAVIL) 25 MG tablet Take 25 mg by mouth at bedtime.     Marland Kitchen aspirin 81 MG EC tablet Take 81 mg by mouth daily.    . blood glucose meter kit and supplies Dispense based on patient and insurance preference. Use up to four times daily as directed. (FOR ICD-9 250.00, 250.01). 1 each 0  . colchicine (COLCRYS) 0.6 MG tablet Take 1 tablet (0.6 mg total) by mouth as needed. 20 tablet 0  . furosemide (LASIX) 40 MG tablet TAKE ONE TABLET EVERY DAY (Patient taking differently: PRN) 30 tablet 6  . hydrochlorothiazide (HYDRODIURIL) 12.5 MG tablet Take 12.5 mg by mouth daily.      . hydrOXYzine (ATARAX/VISTARIL) 10 MG tablet TAKE ONE TABLET THREE TIMES DAILY AS NEEDED FOR ITCHING 90 tablet 2  . ibuprofen (ADVIL,MOTRIN) 400 MG tablet Take 400 mg by mouth as needed.      Marland Kitchen levothyroxine (SYNTHROID) 25 MCG tablet Take 25 mcg by mouth daily.      . modafinil (PROVIGIL) 200 MG tablet     . Multiple Vitamin (MULTIVITAMIN) capsule Take 1 capsule by mouth daily.      . Naltrexone-Bupropion HCl ([name] PO) Take  by mouth 2 (two) times daily.     . naproxen (NAPROSYN) 500 MG tablet Take 250 mg by mouth as needed.     . NON FORMULARY Take by mouth every morning. VITA-PULSE    . ONE TOUCH ULTRA TEST test strip CHECK BLOOD GLUCOSE (SUGAR) UPTO 4 TIMESDAILY AS DIRECTED 100 each 3  . ONETOUCH DELICA LANCETS FINE MISC CHECK BLOOD GLUCOSE (SUGAR) UPTO 4 TIMESDAILY AS DIRECTED 100 each 6  . potassium chloride SA (K-DUR,KLOR-CON) 20 MEQ tablet Take 20 mEq by mouth once. Pt. States he takes when he uses Lasix    . TRULICITY 1.5 YT/0.1SW SOPN     . valsartan (DIOVAN) 160 MG tablet Take 80 mg by mouth daily. Pt states takes 80 mg "when stressed"     No facility-administered medications prior to visit.      Allergies:   Patient has no known allergies.   Past Medical History:  Diagnosis Date  . Adenomatous colon polyp    2005  . Allergic rhinitis   . Aortic stenosis   . Depressed   . Dyslipidemia   . Hypertension   . Nephrolithiasis   . Obesity   . OSA (obstructive sleep apnea)   .  Polycythemia vera(238.4) 06/16/2012  . Sinus complaint     Past Surgical History:  Procedure Laterality Date  . AORTIC VALVE REPLACEMENT  10/15/2008  . [place]  . TONSILLECTOMY  1982, 1993     Social History:  The patient  reports that he has never smoked. He has never used smokeless tobacco. He reports that he drinks alcohol.   Family History:  The patient's family history includes Coronary artery disease in his father and mother; Diabetes in his father; Lung cancer in his father.    ROS:  Please see the history of present illness. All other systems are reviewed and  Negative to the above problem except as noted.    PHYSICAL EXAM: VS:  BP 108/80 (BP Location: Left Arm, Patient Position: Sitting, Cuff Size: Normal)   Pulse 73   Wt 256 lb 9.6 oz (116.4 kg)   SpO2 99%   BMI 35.79 kg/m   GEN: Obese 66 yo , in no acute distress  HEENT: normal  Neck: no JVD, carotid bruits, or masses Cardiac:  RRR; no murmurs , rubs, or gallops,  No signif edema    Respiratory:  clear to auscultation bilaterally, normal work of breathing GI: soft, nontender, nondistended, + BS  No hepatomegaly  MS: no deformity Moving all extremities   Skin: warm and dry, no rash Neuro:  Strength and sensation are intact Psych: euthymic mood, full affect   EKG:  EKG is not  ordered today.    Lipid Panel    Component Value Date/Time   CHOL 154 01/08/2015 1210   TRIG 173 (H) 01/08/2015 1210   HDL 27 (L) 01/08/2015 1210   CHOLHDL 5.7 01/08/2015 1210   VLDL 35 01/08/2015 1210   LDLCALC 92 01/08/2015 1210   LDLDIRECT 101.0 07/01/2014 1102      Wt Readings from Last 3 Encounters:  06/10/17 256 lb 9.6 oz (116.4 kg)  06/09/17 251 lb (113.9 kg)  01/14/17 245 lb (111.1 kg)      ASSESSMENT AND PLAN:  1  AV dz  S/p .  AV replacement  In 2010 with tissue valve .  Echo this week mean gradient is 17 which is stable   2  Hx diastolic CHF  Pt with signif LVH with vigorous function  Volume status is OK   Does not tolerate dehydration.  I would encourage him to walk on treadmill inside, esp in summer  3.  Aortic aneurysm. Stable dilation of aorta  WIll plan f/u scan in 6 months   4.  HTN  I would recomm trial of metoprolol 25 bid as needed if BP high  I think this would allow for more time for diastolic fllling  Hold losartan           Signed, Dorris Carnes, MD  06/12/2017 [facility] [address], [place], [place]  [address] Phone: [phone]; Fax: [phone]

## 2017-06-13 ENCOUNTER — Other Ambulatory Visit: Payer: Self-pay | Admitting: *Deleted

## 2017-06-13 MED ORDER — METOPROLOL TARTRATE 25 MG PO TABS: 25.0000 mg | ORAL_TABLET | Freq: Two times a day (BID) | ORAL | 3 refills | Status: DC

## 2017-06-13 MED ORDER — TRIAMCINOLONE ACETONIDE 0.1 % MT PSTE: 1.0000 "application " | PASTE | Freq: Two times a day (BID) | OROMUCOSAL | 6 refills | Status: DC

## 2017-06-13 NOTE — Progress Notes (Signed)
Message from Dr. Harrington Challenger- patient needs metoprolol tartrate 25 mg BID and Kenalog Orabase (paste) prescriptions sent to [facility].

## 2017-08-04 ENCOUNTER — Other Ambulatory Visit (HOSPITAL_BASED_OUTPATIENT_CLINIC_OR_DEPARTMENT_OTHER): Payer: Medicare Other

## 2017-08-04 ENCOUNTER — Ambulatory Visit (HOSPITAL_BASED_OUTPATIENT_CLINIC_OR_DEPARTMENT_OTHER): Payer: Medicare Other | Admitting: Hematology & Oncology

## 2017-08-04 ENCOUNTER — Encounter: Payer: Self-pay | Admitting: Hematology & Oncology

## 2017-08-04 ENCOUNTER — Other Ambulatory Visit: Payer: Self-pay

## 2017-08-04 ENCOUNTER — Ambulatory Visit (HOSPITAL_BASED_OUTPATIENT_CLINIC_OR_DEPARTMENT_OTHER): Payer: Medicare Other

## 2017-08-04 VITALS — BP 146/93

## 2017-08-04 VITALS — BP 154/92 | HR 68 | Temp 97.8°F | Resp 18 | Wt 256.0 lb

## 2017-08-04 DIAGNOSIS — D45 Polycythemia vera: Secondary | ICD-10-CM

## 2017-08-04 DIAGNOSIS — R82998 Other abnormal findings in urine: Secondary | ICD-10-CM | POA: Diagnosis not present

## 2017-08-04 DIAGNOSIS — E1149 Type 2 diabetes mellitus with other diabetic neurological complication: Secondary | ICD-10-CM | POA: Diagnosis not present

## 2017-08-04 DIAGNOSIS — E038 Other specified hypothyroidism: Secondary | ICD-10-CM | POA: Diagnosis not present

## 2017-08-04 DIAGNOSIS — I10 Essential (primary) hypertension: Secondary | ICD-10-CM | POA: Diagnosis not present

## 2017-08-04 DIAGNOSIS — Z125 Encounter for screening for malignant neoplasm of prostate: Secondary | ICD-10-CM | POA: Diagnosis not present

## 2017-08-04 DIAGNOSIS — E291 Testicular hypofunction: Secondary | ICD-10-CM | POA: Diagnosis not present

## 2017-08-04 DIAGNOSIS — M1000 Idiopathic gout, unspecified site: Secondary | ICD-10-CM | POA: Diagnosis not present

## 2017-08-04 DIAGNOSIS — E7849 Other hyperlipidemia: Secondary | ICD-10-CM | POA: Diagnosis not present

## 2017-08-04 LAB — CMP (CANCER CENTER ONLY)
ALT: 35 U/L (ref 10–47)
AST: 28 U/L (ref 11–38)
Albumin: 3.9 g/dL (ref 3.3–5.5)
Alkaline Phosphatase: 87 U/L — ABNORMAL HIGH (ref 26–84)
BUN: 17 mg/dL (ref 7–22)
CALCIUM: 9.3 mg/dL (ref 8.0–10.3)
CO2: 32 meq/L (ref 18–33)
Chloride: 101 mEq/L (ref 98–108)
Creat: 1.4 mg/dl — ABNORMAL HIGH (ref 0.6–1.2)
GLUCOSE: 92 mg/dL (ref 73–118)
POTASSIUM: 4.3 meq/L (ref 3.3–4.7)
Sodium: 144 mEq/L (ref 128–145)
Total Bilirubin: 0.6 mg/dl (ref 0.20–1.60)
Total Protein: 7.1 g/dL (ref 6.4–8.1)

## 2017-08-04 LAB — CBC WITH DIFFERENTIAL (CANCER CENTER ONLY)
BASO#: 0.3 10*3/uL — AB (ref 0.0–0.2)
BASO%: 2.4 % — AB (ref 0.0–2.0)
EOS ABS: 0.4 10*3/uL (ref 0.0–0.5)
EOS%: 3.4 % (ref 0.0–7.0)
HEMATOCRIT: 41.5 % (ref 38.7–49.9)
HGB: 13 g/dL (ref 13.0–17.1)
LYMPH#: 2.2 10*3/uL (ref 0.9–3.3)
LYMPH%: 18.3 % (ref 14.0–48.0)
MCH: 21.5 pg — ABNORMAL LOW (ref 28.0–33.4)
MCHC: 31.3 g/dL — AB (ref 32.0–35.9)
MCV: 69 fL — AB (ref 82–98)
MONO#: 1 10*3/uL — ABNORMAL HIGH (ref 0.1–0.9)
MONO%: 8.4 % (ref 0.0–13.0)
NEUT%: 67.5 % (ref 40.0–80.0)
NEUTROS ABS: 8 10*3/uL — AB (ref 1.5–6.5)
PLATELETS: 216 10*3/uL (ref 145–400)
RBC: 6.05 10*6/uL — ABNORMAL HIGH (ref 4.20–5.70)
RDW: 19.1 % — AB (ref 11.1–15.7)
WBC: 11.9 10*3/uL — ABNORMAL HIGH (ref 4.0–10.0)

## 2017-08-04 LAB — RETICULOCYTES: Reticulocyte Count: 1.7 % (ref 0.6–2.6)

## 2017-08-04 MED ORDER — FAMOTIDINE IN NACL 20-0.9 MG/50ML-% IV SOLN
40.0000 mg | Freq: Two times a day (BID) | INTRAVENOUS | Status: DC
  Administered 2017-08-04: 40 mg via INTRAVENOUS

## 2017-08-04 MED ORDER — FAMOTIDINE IN NACL 20-0.9 MG/50ML-% IV SOLN
INTRAVENOUS | Status: AC
  Filled 2017-08-04: qty 100

## 2017-08-04 MED ORDER — METHYLPREDNISOLONE SODIUM SUCC 125 MG IJ SOLR
INTRAMUSCULAR | Status: AC
  Filled 2017-08-04: qty 2

## 2017-08-04 MED ORDER — METHYLPREDNISOLONE SODIUM SUCC 125 MG IJ SOLR
60.0000 mg | Freq: Once | INTRAMUSCULAR | Status: AC
  Administered 2017-08-04: 60 mg via INTRAVENOUS

## 2017-08-04 MED ORDER — SODIUM CHLORIDE 0.9 % IV SOLN
60.0000 mg | Freq: Once | INTRAVENOUS | Status: DC
  Filled 2017-08-04: qty 0.48

## 2017-08-04 MED ORDER — SODIUM CHLORIDE 0.9 % IV SOLN
INTRAVENOUS | Status: DC
Start: 2017-08-04 — End: 2017-08-04
  Administered 2017-08-04: 16:00:00 via INTRAVENOUS

## 2017-08-04 NOTE — Progress Notes (Signed)
Hematology and Oncology Follow Up Visit  ARYAV WIMBERLY [id number] 11-Dec-1950 66 y.o. 08/04/2017   Principle Diagnosis:  Polycythemia vera - JAK2 NEGATIVE  Current Therapy:   Phlebotomy to maintain hematocrit below 40% Aspirin 81 mg by mouth daily   Interim History:  Mr. Weigelt is here today for follow-up and phlebotomy.  As usual, he just got back from [place].  Actually, he may have been down in [place].  His wife is from [place].  They were down there for Thanksgiving.  He is only some a great stories about his visit when he was there.  He is feeling okay.  He does have a little bit of a headache.  He says he gets headaches whenever there is a snowstorm.  We are supposed to have a bad snowstorm it this weekend.  He has this urticarial rash.  He is not sure when this happened.  He did take a shingles vaccine.  He thought maybe he has shingles.  He has had no fever.  He has had no cough or shortness of breath.  He has had no nausea or vomiting.  He is now playing bridge right now.  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    Overall, his performance status is ECOG 1.   Medications:  Allergies as of 08/04/2017   No Known Allergies     Medication List        Accurate as of 08/04/17  4:04 PM. Always use your most recent med list.          ALPRAZolam 0.5 MG tablet Commonly known as:  XANAX TAKE ONE TABLET TWICE DAILY AS NEEDED FOR ANXIETY   amitriptyline 25 MG tablet Commonly known as:  ELAVIL Take 25 mg by mouth at bedtime.   aspirin 81 MG EC tablet Take 81 mg by mouth daily.   blood glucose meter kit and supplies Dispense based on patient and insurance preference. Use up to four times daily as directed. (FOR ICD-9 250.00, 250.01).   colchicine 0.6 MG tablet Commonly known as:  COLCRYS Take 1 tablet (0.6 mg total) by mouth as needed.   [name] PO Take by mouth 2 (two) times daily.   furosemide 40 MG tablet Commonly known as:   LASIX TAKE ONE TABLET EVERY DAY   hydrochlorothiazide 12.5 MG tablet Commonly known as:  HYDRODIURIL Take 12.5 mg by mouth daily.   hydrOXYzine 10 MG tablet Commonly known as:  ATARAX/VISTARIL TAKE ONE TABLET THREE TIMES DAILY AS NEEDED FOR ITCHING   ibuprofen 400 MG tablet Commonly known as:  ADVIL,MOTRIN Take 400 mg by mouth as needed.   losartan 50 MG tablet Commonly known as:  COZAAR   metoprolol tartrate 25 MG tablet Commonly known as:  LOPRESSOR Take 1 tablet (25 mg total) by mouth 2 (two) times daily.   modafinil 200 MG tablet Commonly known as:  PROVIGIL   multivitamin capsule Take 1 capsule by mouth daily.   naproxen 500 MG tablet Commonly known as:  NAPROSYN Take 250 mg by mouth as needed.   NON FORMULARY Take by mouth every morning. VITA-PULSE   ONE TOUCH ULTRA TEST test strip Generic drug:  glucose blood CHECK BLOOD GLUCOSE (SUGAR) UPTO 4 TIMESDAILY AS DIRECTED   ONETOUCH DELICA LANCETS FINE Misc CHECK BLOOD GLUCOSE (SUGAR) UPTO 4 TIMESDAILY AS DIRECTED   potassium chloride SA 20 MEQ tablet Commonly known as:  K-DUR,KLOR-CON Take 20 mEq by mouth once. Pt. States he takes when he uses Lasix  SYNTHROID 25 MCG tablet Generic drug:  levothyroxine Take 25 mcg by mouth daily.   triamcinolone 0.1 % paste Commonly known as:  KENALOG Use as directed 1 application in the mouth or throat 2 (two) times daily.   TRULICITY 1.5 VQ/2.5ZD Sopn Generic drug:  Dulaglutide   valsartan 160 MG tablet Commonly known as:  DIOVAN Take 80 mg by mouth daily. Pt states takes 80 mg "when stressed"       Allergies: No Known Allergies  Past Medical History, Surgical history, Social history, and Family History were reviewed and updated.  Review of Systems: As stated in the interim history  Physical Exam:  weight is 256 lb (116.1 kg). His oral temperature is 97.8 F (36.6 C). His blood pressure is 154/92 (abnormal) and his pulse is 68. His respiration is 18 and  oxygen saturation is 97%.   Wt Readings from Last 3 Encounters:  08/04/17 256 lb (116.1 kg)  06/10/17 256 lb 9.6 oz (116.4 kg)  06/09/17 251 lb (113.9 kg)    Moderately obese white male. Head and neck exam shows no ocular or oral lesions. He has no conjunctival inflammation. He has no facial plethora. There is no adenopathy on his neck. Lungs are clear bilaterally. Cardiac exam regular rate and rhythm. He does have a 2/6 systolic ejection murmur. Abdomen is soft. He is obese. He has good bowel sounds. There is no fluid wave. There is no palpable liver or spleen tip. Back exam shows no tenderness over the spine, ribs or hips. Extremities shows no clubbing, cyanosis or edema. Has good range of motion of his joints. Skin exam shows an urticarial rash on his back.  He has these discrete macular type areas.  They are slightly palpable.  They do not blanch.  Lab Results  Component Value Date   WBC 11.9 (H) 08/04/2017   HGB 13.0 08/04/2017   HCT 41.5 08/04/2017   MCV 69 (L) 08/04/2017   PLT 216 08/04/2017   Lab Results  Component Value Date   FERRITIN 9 (L) 06/09/2017   IRON 27 (L) 06/09/2017   TIBC 414 (H) 06/09/2017   UIBC 387 (H) 06/09/2017   IRONPCTSAT 7 (L) 06/09/2017   Lab Results  Component Value Date   RETICCTPCT 1.8 08/08/2013   RBC 6.05 (H) 08/04/2017   RETICCTABS 93.6 08/08/2013   No results found for: KPAFRELGTCHN, LAMBDASER, KAPLAMBRATIO No results found for: Kandis Cocking, IGMSERUM No results found for: Odetta Pink, SPEI   Chemistry      Component Value Date/Time   NA 144 08/04/2017 1438   NA 143 01/14/2017 0846   K 4.3 08/04/2017 1438   K 3.6 01/14/2017 0846   CL 101 08/04/2017 1438   CO2 32 08/04/2017 1438   CO2 31 (H) 01/14/2017 0846   BUN 17 08/04/2017 1438   BUN 22.2 01/14/2017 0846   CREATININE 1.4 (H) 08/04/2017 1438   CREATININE 1.5 (H) 01/14/2017 0846      Component Value Date/Time   CALCIUM 9.3  08/04/2017 1438   CALCIUM 9.9 01/14/2017 0846   ALKPHOS 87 (H) 08/04/2017 1438   ALKPHOS 87 01/14/2017 0846   AST 28 08/04/2017 1438   AST 26 01/14/2017 0846   ALT 35 08/04/2017 1438   ALT 29 01/14/2017 0846   BILITOT 0.60 08/04/2017 1438   BILITOT 0.54 01/14/2017 0846      Impression and Plan: Mr. Ardolino is a pleasant 66 year old white male. He has polycythemia. We keep  his hematocrit below 40%.  We will go ahead and phlebotomize him today.  Looks like he has urticaria.  It looks like this is some type of allergic reaction.  Not sure exactly what he might have gotten that could have caused the allergic reaction.  I will go ahead and give him some Pepcid and Solu-Medrol, both IV.  I know that the Solu-Medrol might increase his blood sugars a little bit but his blood sugar is not all that bad today.  I think we should be able to help with this urticarial type reaction.  He will be heading back down to [facility] after Christmas.  We will plan to get him back in another couple months.  Volanda Napoleon, MD 12/6/20184:04 PM this

## 2017-08-04 NOTE — Progress Notes (Signed)
I unit phlebotomy performed using a 16 gauge phlebotomy kit in the LAC over 5 minutes.  Patient tolerated well. Nourishment provided. IV started in right hand using a 22 gauge IV catheter to administer replacement fluids, pepcid, and solu medrol.

## 2017-08-04 NOTE — Patient Instructions (Signed)
Therapeutic Phlebotomy Therapeutic phlebotomy is the controlled removal of blood from a person's body for the purpose of treating a medical condition. The procedure is similar to donating blood. Usually, about a pint (470 mL, or 0.47L) of blood is removed. The average adult has 9-12 pints (4.3-5.7 L) of blood. Therapeutic phlebotomy may be used to treat the following medical conditions:  Hemochromatosis. This is a condition in which the blood contains too much iron.  Polycythemia vera. This is a condition in which the blood contains too many red blood cells.  Porphyria cutanea tarda. This is a disease in which an important part of hemoglobin is not made properly. It results in the buildup of abnormal amounts of porphyrins in the body.  Sickle cell disease. This is a condition in which the red blood cells form an abnormal crescent shape rather than a round shape.  Tell a health care provider about:  Any allergies you have.  All medicines you are taking, including vitamins, herbs, eye drops, creams, and over-the-counter medicines.  Any problems you or family members have had with anesthetic medicines.  Any blood disorders you have.  Any surgeries you have had.  Any medical conditions you have. What are the risks? Generally, this is a safe procedure. However, problems may occur, including:  Nausea or light-headedness.  Low blood pressure.  Soreness, bleeding, swelling, or bruising at the needle insertion site.  Infection.  What happens before the procedure?  Follow instructions from your health care provider about eating or drinking restrictions.  Ask your health care provider about changing or stopping your regular medicines. This is especially important if you are taking diabetes medicines or blood thinners.  Wear clothing with sleeves that can be raised above the elbow.  Plan to have someone take you home after the procedure.  You may have a blood sample taken. What  happens during the procedure?  A needle will be inserted into one of your veins.  Tubing and a collection bag will be attached to that needle.  Blood will flow through the needle and tubing into the collection bag.  You may be asked to open and close your hand slowly and continually during the entire collection.  After the specified amount of blood has been removed from your body, the collection bag and tubing will be clamped.  The needle will be removed from your vein.  Pressure will be held on the site of the needle insertion to stop the bleeding.  A bandage (dressing) will be placed over the needle insertion site. The procedure may vary among health care providers and hospitals. What happens after the procedure?  Your recovery will be assessed and monitored.  You can return to your normal activities as directed by your health care provider. This information is not intended to replace advice given to you by your health care provider. Make sure you discuss any questions you have with your health care provider. Document Released: 01/18/2011 Document Revised: 04/17/2016 Document Reviewed: 08/12/2014 Elsevier Interactive Patient Education  2018 Elsevier Inc.  

## 2017-08-05 LAB — IRON AND TIBC
%SAT: 6 % — AB (ref 20–55)
Iron: 26 ug/dL — ABNORMAL LOW (ref 42–163)
TIBC: 445 ug/dL — ABNORMAL HIGH (ref 202–409)
UIBC: 419 ug/dL — ABNORMAL HIGH (ref 117–376)

## 2017-08-05 LAB — FERRITIN: Ferritin: 12 ng/ml — ABNORMAL LOW (ref 22–316)

## 2017-08-09 DIAGNOSIS — Z6836 Body mass index (BMI) 36.0-36.9, adult: Secondary | ICD-10-CM | POA: Diagnosis not present

## 2017-08-09 DIAGNOSIS — E7849 Other hyperlipidemia: Secondary | ICD-10-CM | POA: Diagnosis not present

## 2017-08-09 DIAGNOSIS — R1031 Right lower quadrant pain: Secondary | ICD-10-CM | POA: Diagnosis not present

## 2017-08-09 DIAGNOSIS — E291 Testicular hypofunction: Secondary | ICD-10-CM | POA: Diagnosis not present

## 2017-08-09 DIAGNOSIS — E8881 Metabolic syndrome: Secondary | ICD-10-CM | POA: Diagnosis not present

## 2017-08-09 DIAGNOSIS — N200 Calculus of kidney: Secondary | ICD-10-CM | POA: Diagnosis not present

## 2017-08-09 DIAGNOSIS — Z23 Encounter for immunization: Secondary | ICD-10-CM | POA: Diagnosis not present

## 2017-08-09 DIAGNOSIS — I5032 Chronic diastolic (congestive) heart failure: Secondary | ICD-10-CM | POA: Diagnosis not present

## 2017-08-09 DIAGNOSIS — I10 Essential (primary) hypertension: Secondary | ICD-10-CM | POA: Diagnosis not present

## 2017-08-09 DIAGNOSIS — Z0000 Encounter for general adult medical examination without abnormal findings: Secondary | ICD-10-CM | POA: Diagnosis not present

## 2017-08-09 DIAGNOSIS — E1149 Type 2 diabetes mellitus with other diabetic neurological complication: Secondary | ICD-10-CM | POA: Diagnosis not present

## 2017-08-09 DIAGNOSIS — R972 Elevated prostate specific antigen [PSA]: Secondary | ICD-10-CM | POA: Diagnosis not present

## 2017-08-12 ENCOUNTER — Encounter: Payer: Self-pay | Admitting: Hematology & Oncology

## 2017-08-24 ENCOUNTER — Other Ambulatory Visit: Payer: Self-pay | Admitting: Family

## 2017-08-25 DIAGNOSIS — Z1212 Encounter for screening for malignant neoplasm of rectum: Secondary | ICD-10-CM | POA: Diagnosis not present

## 2017-09-15 DIAGNOSIS — N209 Urinary calculus, unspecified: Secondary | ICD-10-CM | POA: Diagnosis not present

## 2017-09-16 DIAGNOSIS — N2889 Other specified disorders of kidney and ureter: Secondary | ICD-10-CM | POA: Diagnosis not present

## 2017-09-26 ENCOUNTER — Other Ambulatory Visit: Payer: Self-pay | Admitting: Family

## 2017-09-29 DIAGNOSIS — N401 Enlarged prostate with lower urinary tract symptoms: Secondary | ICD-10-CM | POA: Diagnosis not present

## 2017-09-29 DIAGNOSIS — N209 Urinary calculus, unspecified: Secondary | ICD-10-CM | POA: Diagnosis not present

## 2017-10-31 ENCOUNTER — Encounter: Payer: Self-pay | Admitting: Internal Medicine

## 2017-11-01 NOTE — Progress Notes (Signed)
Pt's wife called to report that pt passed away on November 27, 2022. Staff notified and appt has been canceled.

## 2017-11-01 NOTE — Progress Notes (Signed)
Pt's wife called to report that pt passed away on 11-05-22. Staff has been notified and appt has been canceled.

## 2017-11-09 NOTE — Progress Notes (Unsigned)
Pt's wife called to report pt passed away on 2022/11/13. Pt was on schedule for a new pt consult with Dr. Cory Munch on 3/13. Staff notified and appt has been canceled.

## 2017-11-14 ENCOUNTER — Inpatient Hospital Stay: Payer: Medicare Other

## 2017-11-14 ENCOUNTER — Other Ambulatory Visit: Payer: Self-pay

## 2017-11-14 ENCOUNTER — Encounter: Payer: Self-pay | Admitting: Family

## 2017-11-14 ENCOUNTER — Inpatient Hospital Stay: Payer: Medicare Other | Attending: Family | Admitting: Family

## 2017-11-14 VITALS — BP 122/79 | HR 69

## 2017-11-14 VITALS — BP 134/77 | HR 84 | Temp 98.1°F | Resp 18 | Wt 266.0 lb

## 2017-11-14 DIAGNOSIS — D45 Polycythemia vera: Secondary | ICD-10-CM

## 2017-11-14 DIAGNOSIS — D500 Iron deficiency anemia secondary to blood loss (chronic): Secondary | ICD-10-CM

## 2017-11-14 LAB — CMP (CANCER CENTER ONLY)
ALT: 37 U/L (ref 0–55)
AST: 29 U/L (ref 5–34)
Albumin: 3.9 g/dL (ref 3.5–5.0)
Alkaline Phosphatase: 98 U/L (ref 40–150)
Anion gap: 13 — ABNORMAL HIGH (ref 3–11)
BUN: 21 mg/dL (ref 7–26)
CHLORIDE: 100 mmol/L (ref 98–109)
CO2: 28 mmol/L (ref 22–29)
CREATININE: 1.44 mg/dL — AB (ref 0.70–1.30)
Calcium: 9.6 mg/dL (ref 8.4–10.4)
GFR, EST NON AFRICAN AMERICAN: 50 mL/min — AB (ref >60)
GFR, Est AFR Am: 58 mL/min — ABNORMAL LOW (ref >60)
Glucose, Bld: 305 mg/dL — ABNORMAL HIGH (ref 70–140)
Potassium: 3.9 mmol/L (ref 3.5–5.1)
SODIUM: 141 mmol/L (ref 136–145)
Total Bilirubin: 0.4 mg/dL (ref 0.2–1.2)
Total Protein: 7.1 g/dL (ref 6.4–8.3)

## 2017-11-14 LAB — CBC WITH DIFFERENTIAL (CANCER CENTER ONLY)
BASOS ABS: 0.2 10*3/uL — AB (ref 0.0–0.1)
BASOS PCT: 3 %
EOS ABS: 0.4 10*3/uL (ref 0.0–0.5)
EOS PCT: 5 %
HCT: 44.7 % (ref 38.7–49.9)
Hemoglobin: 13.9 g/dL (ref 13.0–17.1)
LYMPHS PCT: 24 %
Lymphs Abs: 1.7 10*3/uL (ref 0.9–3.3)
MCH: 21.7 pg — ABNORMAL LOW (ref 28.0–33.4)
MCHC: 31.1 g/dL — ABNORMAL LOW (ref 32.0–35.9)
MCV: 69.7 fL — ABNORMAL LOW (ref 82.0–98.0)
MONO ABS: 0.4 10*3/uL (ref 0.1–0.9)
Monocytes Relative: 6 %
Neutro Abs: 4.4 10*3/uL (ref 1.5–6.5)
Neutrophils Relative %: 62 %
PLATELETS: 189 10*3/uL (ref 145–400)
RBC: 6.41 MIL/uL — AB (ref 4.20–5.70)
RDW: 19.9 % — ABNORMAL HIGH (ref 11.1–15.7)
WBC: 7.2 10*3/uL (ref 4.0–10.0)

## 2017-11-14 LAB — IRON AND TIBC
IRON: 40 ug/dL — AB (ref 42–163)
SATURATION RATIOS: 10 % — AB (ref 42–163)
TIBC: 400 ug/dL (ref 202–409)
UIBC: 360 ug/dL

## 2017-11-14 LAB — FERRITIN: FERRITIN: 10 ng/mL — AB (ref 22–316)

## 2017-11-14 MED ORDER — SODIUM CHLORIDE 0.9 % IV SOLN
INTRAVENOUS | Status: DC
  Administered 2017-11-14: 12:00:00 via INTRAVENOUS

## 2017-11-14 NOTE — Patient Instructions (Signed)
Therapeutic Phlebotomy Therapeutic phlebotomy is the controlled removal of blood from a person's body for the purpose of treating a medical condition. The procedure is similar to donating blood. Usually, about a pint (470 mL, or 0.47L) of blood is removed. The average adult has 9-12 pints (4.3-5.7 L) of blood. Therapeutic phlebotomy may be used to treat the following medical conditions:  Hemochromatosis. This is a condition in which the blood contains too much iron.  Polycythemia vera. This is a condition in which the blood contains too many red blood cells.  Porphyria cutanea tarda. This is a disease in which an important part of hemoglobin is not made properly. It results in the buildup of abnormal amounts of porphyrins in the body.  Sickle cell disease. This is a condition in which the red blood cells form an abnormal crescent shape rather than a round shape.  Tell a health care provider about:  Any allergies you have.  All medicines you are taking, including vitamins, herbs, eye drops, creams, and over-the-counter medicines.  Any problems you or family members have had with anesthetic medicines.  Any blood disorders you have.  Any surgeries you have had.  Any medical conditions you have. What are the risks? Generally, this is a safe procedure. However, problems may occur, including:  Nausea or light-headedness.  Low blood pressure.  Soreness, bleeding, swelling, or bruising at the needle insertion site.  Infection.  What happens before the procedure?  Follow instructions from your health care provider about eating or drinking restrictions.  Ask your health care provider about changing or stopping your regular medicines. This is especially important if you are taking diabetes medicines or blood thinners.  Wear clothing with sleeves that can be raised above the elbow.  Plan to have someone take you home after the procedure.  You may have a blood sample taken. What  happens during the procedure?  A needle will be inserted into one of your veins.  Tubing and a collection bag will be attached to that needle.  Blood will flow through the needle and tubing into the collection bag.  You may be asked to open and close your hand slowly and continually during the entire collection.  After the specified amount of blood has been removed from your body, the collection bag and tubing will be clamped.  The needle will be removed from your vein.  Pressure will be held on the site of the needle insertion to stop the bleeding.  A bandage (dressing) will be placed over the needle insertion site. The procedure may vary among health care providers and hospitals. What happens after the procedure?  Your recovery will be assessed and monitored.  You can return to your normal activities as directed by your health care provider. This information is not intended to replace advice given to you by your health care provider. Make sure you discuss any questions you have with your health care provider. Document Released: 01/18/2011 Document Revised: 04/17/2016 Document Reviewed: 08/12/2014 Elsevier Interactive Patient Education  2018 Elsevier Inc.  

## 2017-11-14 NOTE — Progress Notes (Signed)
Hematology and Oncology Follow Up Visit  JAHAD OLD [id number] 1950/11/26 67 y.o. 11/14/2017   Principle Diagnosis:  Polycythemia vera - JAK2 NEGATIVE  Current Therapy:   Phlebotomy to maintain hematocrit below 40% Aspirin 81 mg by mouth daily   Interim History:  Chase Gutierrez is here today for follow-up. He is symptomatic with fatigue, sleeping more than usual, increased joint aches and pains and SOB with exertion.  He is using his CPAP nightly as directed.  He is taking his baby aspirin daily.  No fever, chills, n/v, cough, rash, dizziness, chest pain, palpitations, abdominal pain or changes in bowel or bladder habits.  He is taking his lasix as needed for any swelling in his lower extremities. He sees cardiology again this week on Friday, Dr. Dorris Carnes.  No numbness or tingling in her extremities.  No lymphadenopathy found on exam.  No episodes of bleeding, no bruising or petechiae.  He has maintained a good appetite and is staying hydrated. His weight is stable.   ECOG Performance Status: 1 - Symptomatic but completely ambulatory  Medications:  Allergies as of 11/14/2017      Reactions   Provigil [modafinil] Hives      Medication List        Accurate as of 11/14/17 12:50 PM. Always use your most recent med list.          ALPRAZolam 0.5 MG tablet Commonly known as:  XANAX TAKE ONE TABLET TWICE DAILY AS NEEDED FOR ANXIETY   amitriptyline 25 MG tablet Commonly known as:  ELAVIL Take 25 mg by mouth at bedtime.   aspirin 81 MG EC tablet Take 81 mg by mouth daily.   blood glucose meter kit and supplies Dispense based on patient and insurance preference. Use up to four times daily as directed. (FOR ICD-9 250.00, 250.01).   colchicine 0.6 MG tablet Commonly known as:  COLCRYS Take 1 tablet (0.6 mg total) by mouth as needed.   [name] PO Take by mouth 2 (two) times daily.   furosemide 40 MG tablet Commonly known as:  LASIX TAKE ONE TABLET EVERY DAY     hydrochlorothiazide 12.5 MG tablet Commonly known as:  HYDRODIURIL Take 12.5 mg by mouth daily.   hydrOXYzine 10 MG tablet Commonly known as:  ATARAX/VISTARIL TAKE ONE TABLET THREE TIMES DAILY AS NEEDED FOR ITCHING   ibuprofen 400 MG tablet Commonly known as:  ADVIL,MOTRIN Take 400 mg by mouth as needed.   metoprolol tartrate 25 MG tablet Commonly known as:  LOPRESSOR Take 1 tablet (25 mg total) by mouth 2 (two) times daily.   multivitamin capsule Take 1 capsule by mouth daily.   naproxen 500 MG tablet Commonly known as:  NAPROSYN Take 250 mg by mouth as needed.   NON FORMULARY Take by mouth every morning. VITA-PULSE   ONE TOUCH ULTRA TEST test strip Generic drug:  glucose blood CHECK BLOOD GLUCOSE (SUGAR) UPTO 4 TIMESDAILY AS DIRECTED   ONETOUCH DELICA LANCETS FINE Misc CHECK BLOOD GLUCOSE (SUGAR) UPTO 4 TIMESDAILY AS DIRECTED   potassium chloride SA 20 MEQ tablet Commonly known as:  K-DUR,KLOR-CON Take 20 mEq by mouth once. Pt. States he takes when he uses Lasix   SYNTHROID 25 MCG tablet Generic drug:  levothyroxine Take 25 mcg by mouth daily.   triamcinolone 0.1 % paste Commonly known as:  KENALOG Use as directed 1 application in the mouth or throat 2 (two) times daily.   TRULICITY 1.5 KH/9.9HF Sopn Generic drug:  Dulaglutide  Allergies:  Allergies  Allergen Reactions  . Provigil [Modafinil] Hives    Past Medical History, Surgical history, Social history, and Family History were reviewed and updated.  Review of Systems: All other 10 point review of systems is negative.   Physical Exam:  weight is 266 lb (120.7 kg). His oral temperature is 98.1 F (36.7 C). His blood pressure is 134/77 and his pulse is 84. His respiration is 18 and oxygen saturation is 97%.   Wt Readings from Last 3 Encounters:  11/14/17 266 lb (120.7 kg)  08/04/17 256 lb (116.1 kg)  06/10/17 256 lb 9.6 oz (116.4 kg)    Ocular: Sclerae unicteric, pupils equal, round and  reactive to light Ear-nose-throat: Oropharynx clear, dentition fair Lymphatic: No cervical, supraclavicular or axillary adenopathy Lungs no rales or rhonchi, good excursion bilaterally Heart regular rate and rhythm, no murmur appreciated Abd soft, nontender, positive bowel sounds, no liver or spleen tip palpated on exam, no fluid wave  MSK no focal spinal tenderness, no joint edema Neuro: non-focal, well-oriented, appropriate affect Breasts: Deferred   Lab Results  Component Value Date   WBC 7.2 11/14/2017   HGB 13.0 08/04/2017   HCT 44.7 11/14/2017   MCV 69.7 (L) 11/14/2017   PLT 189 11/14/2017   Lab Results  Component Value Date   FERRITIN 12 (L) 08/04/2017   IRON 26 (L) 08/04/2017   TIBC 445 (H) 08/04/2017   UIBC 419 (H) 08/04/2017   IRONPCTSAT 6 (L) 08/04/2017   Lab Results  Component Value Date   RETICCTPCT 1.8 08/08/2013   RBC 6.41 (H) 11/14/2017   RETICCTABS 93.6 08/08/2013   No results found for: KPAFRELGTCHN, LAMBDASER, KAPLAMBRATIO No results found for: Kandis Cocking, IGMSERUM No results found for: Odetta Pink, SPEI   Chemistry      Component Value Date/Time   NA 144 08/04/2017 1438   NA 143 01/14/2017 0846   K 4.3 08/04/2017 1438   K 3.6 01/14/2017 0846   CL 101 08/04/2017 1438   CO2 32 08/04/2017 1438   CO2 31 (H) 01/14/2017 0846   BUN 17 08/04/2017 1438   BUN 22.2 01/14/2017 0846   CREATININE 1.4 (H) 08/04/2017 1438   CREATININE 1.5 (H) 01/14/2017 0846      Component Value Date/Time   CALCIUM 9.3 08/04/2017 1438   CALCIUM 9.9 01/14/2017 0846   ALKPHOS 87 (H) 08/04/2017 1438   ALKPHOS 87 01/14/2017 0846   AST 28 08/04/2017 1438   AST 26 01/14/2017 0846   ALT 35 08/04/2017 1438   ALT 29 01/14/2017 0846   BILITOT 0.60 08/04/2017 1438   BILITOT 0.54 01/14/2017 0846      Impression and Plan: Chase Gutierrez is a very pleasant 67 yo caucasian gentleman with polycythemia, JAK-2 negative.  Hct is  up to 44.7% and is quite symptomatic as mentioned above. We will phlebotomize him today and give replacement fluids. We will do this a second time next week. He is in agreement with the plan.  We will see him back in another 2 months when he is back from [place].  He will contact our office with any questions or concerns. We can certainly see her sooner if need be.   Chase Peace, NP 3/18/201912:50 PM

## 2017-11-14 NOTE — Progress Notes (Signed)
1 unit phlebotomy performed using a 16 gauge phlebotomy kit to LAC over 5 minutes. Patient tolerated well. Nourishment provided.   Replacement fluids administered through a 22 gauge IV catheter in right hand over 30 minutes per physician order.

## 2017-11-18 ENCOUNTER — Encounter: Payer: Self-pay | Admitting: Internal Medicine

## 2017-11-18 ENCOUNTER — Ambulatory Visit (INDEPENDENT_AMBULATORY_CARE_PROVIDER_SITE_OTHER): Payer: Medicare Other | Admitting: Internal Medicine

## 2017-11-18 VITALS — BP 134/88 | HR 67 | Ht 71.0 in | Wt 269.0 lb

## 2017-11-18 DIAGNOSIS — E7849 Other hyperlipidemia: Secondary | ICD-10-CM

## 2017-11-18 DIAGNOSIS — I712 Thoracic aortic aneurysm, without rupture, unspecified: Secondary | ICD-10-CM

## 2017-11-18 DIAGNOSIS — I10 Essential (primary) hypertension: Secondary | ICD-10-CM

## 2017-11-18 DIAGNOSIS — G4730 Sleep apnea, unspecified: Secondary | ICD-10-CM | POA: Diagnosis not present

## 2017-11-18 DIAGNOSIS — R0602 Shortness of breath: Secondary | ICD-10-CM

## 2017-11-18 DIAGNOSIS — I359 Nonrheumatic aortic valve disorder, unspecified: Secondary | ICD-10-CM | POA: Diagnosis not present

## 2017-11-18 NOTE — Patient Instructions (Signed)
Medication Instructions:  Your physician recommends that you continue on your current medications as directed. Please refer to the Current Medication list given to you today.   Labwork: Your physician recommends that you return for lab work in: Today    Testing/Procedures: NONE   Follow-Up: Your physician recommends that you schedule a follow-up appointment call office in June for fall appointment.    Any Other Special Instructions Will Be Listed Below (If Applicable).     If you need a refill on your cardiac medications before your next appointment, please call your pharmacy.  Thank you for choosing [facility]!

## 2017-11-18 NOTE — Progress Notes (Signed)
Cardiology Office Note   Date:  11/18/2017   ID:  Chase Gutierrez, DOB 05-25-1951, MRN [id number]  PCP:  Marton Redwood, MD  Cardiologist:   Dorris Carnes, MD   F/U of HTN and AV dz     History of Present Illness: Chase Gutierrez is a 67 y.o. male with a history of AS  He is s/p AVR in 2010.  Also a history of HTN, thoracic aortic aneurysm, distolic CHF, PCV, sleep apnea.  I saw hiim in clinic in Oct 2018 He is back in [place] until next Friday     The pt says he has just felt fatigued.   Joints in hands hurt  He was just seen by P Ennever yesterday and heme clinic  Underwent phlebotomy     Had IV fluids replaced after this    Says he is feeling not significantly better   He denies dizziness  No presyncope Using CPAP nightly Denies CP     Needs to get the energy to start exercising   Can sleep a lot  Outpatient Medications Prior to Visit  Medication Sig Dispense Refill  . ALPRAZolam (XANAX) 0.5 MG tablet TAKE ONE TABLET TWICE DAILY AS NEEDED FOR ANXIETY 60 tablet 0  . amitriptyline (ELAVIL) 25 MG tablet Take 25 mg by mouth at bedtime.     Marland Kitchen aspirin 81 MG EC tablet Take 81 mg by mouth daily.    . blood glucose meter kit and supplies Dispense based on patient and insurance preference. Use up to four times daily as directed. (FOR ICD-9 250.00, 250.01). 1 each 0  . colchicine (COLCRYS) 0.6 MG tablet Take 1 tablet (0.6 mg total) by mouth as needed. 20 tablet 0  . furosemide (LASIX) 40 MG tablet TAKE ONE TABLET EVERY DAY (Patient taking differently: PRN) 30 tablet 6  . hydrochlorothiazide (HYDRODIURIL) 12.5 MG tablet Take 12.5 mg by mouth daily.      . hydrOXYzine (ATARAX/VISTARIL) 10 MG tablet TAKE ONE TABLET THREE TIMES DAILY AS NEEDED FOR ITCHING 90 tablet 2  . ibuprofen (ADVIL,MOTRIN) 400 MG tablet Take 400 mg by mouth as needed.      Marland Kitchen levothyroxine (SYNTHROID) 25 MCG tablet Take 25 mcg by mouth daily.      . metoprolol tartrate (LOPRESSOR) 25 MG tablet Take 1 tablet (25 mg  total) by mouth 2 (two) times daily. 180 tablet 3  . Multiple Vitamin (MULTIVITAMIN) capsule Take 1 capsule by mouth daily.      . Naltrexone-Bupropion HCl ([name] PO) Take 2 tablets by mouth 2 (two) times daily.     . naproxen (NAPROSYN) 500 MG tablet Take 250 mg by mouth as needed.     . NON FORMULARY Take by mouth every morning. VITA-PULSE    . ONE TOUCH ULTRA TEST test strip CHECK BLOOD GLUCOSE (SUGAR) UPTO 4 TIMESDAILY AS DIRECTED 100 each 3  . ONETOUCH DELICA LANCETS FINE MISC CHECK BLOOD GLUCOSE (SUGAR) UPTO 4 TIMESDAILY AS DIRECTED 100 each 6  . potassium chloride SA (K-DUR,KLOR-CON) 20 MEQ tablet Take 20 mEq by mouth once. Pt. States he takes when he uses Lasix    . triamcinolone (KENALOG) 0.1 % paste Use as directed 1 application in the mouth or throat 2 (two) times daily. 5 g 6  . TRULICITY 1.5 RJ/1.8AC SOPN      No facility-administered medications prior to visit.      Allergies:   Provigil [modafinil]   Past Medical History:  Diagnosis Date  . Adenomatous  colon polyp    2005  . Allergic rhinitis   . Aortic stenosis   . Depressed   . Dyslipidemia   . Hypertension   . Nephrolithiasis   . Obesity   . OSA (obstructive sleep apnea)   . Polycythemia vera(238.4) 06/16/2012  . Sinus complaint     Past Surgical History:  Procedure Laterality Date  . AORTIC VALVE REPLACEMENT  10/15/2008  . [place]  . TONSILLECTOMY  1982, 1993     Social History:  The patient  reports that he has never smoked. He has never used smokeless tobacco. He reports that he drinks alcohol.   Family History:  The patient's family history includes Coronary artery disease in his father and mother; Diabetes in his father; Lung cancer in his father.    ROS:  Please see the history of present illness. All other systems are reviewed and  Negative to the above problem except as noted.    PHYSICAL EXAM: VS:  BP 134/88   Pulse 67   Ht '5\' 11"'$  (1.803 m)   Wt 269 lb (122 kg)   SpO2  95% Comment: on room air  BMI 37.52 kg/m   GEN: Morbidly obese 67 yo , in no acute distress  HEENT: normal  Neck: no JVD, carotid bruits, or masses Cardiac: RRR; Gr II/VI systolic murmur at base  NO , rubs, or gallops,  No signif edema    Respiratory:  clear to auscultation bilaterally, normal work of breathing GI: soft, nontender, nondistended, + BS  No hepatomegaly  MS: no deformity Moving all extremities   Skin: warm and dry, no rash Neuro:  Strength and sensation are intact Psych: euthymic mood, full affect   EKG:  EKG is  ordered today.  SR 69 bpm  T wave inveriosn V3 to V6, II, I, L  Lipid Panel    Component Value Date/Time   CHOL 154 01/08/2015 1210   TRIG 173 (H) 01/08/2015 1210   HDL 27 (L) 01/08/2015 1210   CHOLHDL 5.7 01/08/2015 1210   VLDL 35 01/08/2015 1210   LDLCALC 92 01/08/2015 1210   LDLDIRECT 101.0 07/01/2014 1102      Wt Readings from Last 3 Encounters:  11/18/17 269 lb (122 kg)  11/14/17 266 lb (120.7 kg)  08/04/17 256 lb (116.1 kg)      ASSESSMENT AND PLAN:  1  AV dz  S/p .  AV replacement  In 2010 with tissue valve .Echo last fall mean grad was 75   Murmur today is probably subvalvluar   Patient has signif  LVH and hyperdynamic ventricle Follow with periodic echoes  2  Hx diastolic CHF  Pt underwent phlebotomy yesterday   TO get additional next week  WIll get some fluids with this   Follow   3.  Aortic aneurysm. Stable dilation of aorta 48 mm on CT last fall   F/U CT this summer   4.  HTN  Diastolic 50/    Pt says BP is up and down   I am reluctant to change with upcoming phlebotomy.      5  Lipids   Check fasting   6   PCV  Followed by P Ennever  7   OSA  Uses CPAP  8  Morbid obesity   ENcouraged him to get back on diet   Walk inside on treadmill      Signed, Dorris Carnes, MD  11/18/2017 10:08 AM    [facility] [place], [place], [place]  [address] Phone: [phone]; Fax: [phone]

## 2017-11-21 ENCOUNTER — Other Ambulatory Visit: Payer: Medicare Other | Admitting: *Deleted

## 2017-11-21 ENCOUNTER — Other Ambulatory Visit: Payer: Self-pay

## 2017-11-21 ENCOUNTER — Other Ambulatory Visit: Payer: Self-pay | Admitting: *Deleted

## 2017-11-21 ENCOUNTER — Inpatient Hospital Stay: Payer: Medicare Other

## 2017-11-21 VITALS — BP 132/88 | HR 73 | Temp 98.2°F | Resp 20

## 2017-11-21 DIAGNOSIS — E7849 Other hyperlipidemia: Secondary | ICD-10-CM

## 2017-11-21 DIAGNOSIS — I10 Essential (primary) hypertension: Secondary | ICD-10-CM | POA: Diagnosis not present

## 2017-11-21 DIAGNOSIS — R0602 Shortness of breath: Secondary | ICD-10-CM

## 2017-11-21 DIAGNOSIS — D45 Polycythemia vera: Secondary | ICD-10-CM

## 2017-11-21 MED ORDER — SODIUM CHLORIDE 0.9 % IV SOLN
1000.0000 mL | Freq: Once | INTRAVENOUS | Status: AC
  Administered 2017-11-21: 1000 mL via INTRAVENOUS

## 2017-11-21 NOTE — Patient Instructions (Signed)

## 2017-11-21 NOTE — Progress Notes (Signed)
Chase Gutierrez presents today for phlebotomy per MD orders. Phlebotomy procedure started at 1320 and ended at 1330. 610 cc removed via 16 G needle at R antecubital site. Patient tolerated procedure well.

## 2017-11-22 LAB — LIPID PANEL
CHOL/HDL RATIO: 5.5 ratio — AB (ref 0.0–5.0)
Cholesterol, Total: 149 mg/dL (ref 100–199)
HDL: 27 mg/dL — AB (ref >39)
LDL CALC: 72 mg/dL (ref 0–99)
TRIGLYCERIDES: 252 mg/dL — AB (ref 0–149)
VLDL Cholesterol Cal: 50 mg/dL — ABNORMAL HIGH (ref 5–40)

## 2017-11-22 LAB — C-REACTIVE PROTEIN: CRP: 1.6 mg/L (ref 0.0–4.9)

## 2017-11-22 LAB — ANA: Anti Nuclear Antibody(ANA): NEGATIVE

## 2017-11-22 LAB — RHEUMATOID FACTOR: Rhuematoid fact SerPl-aCnc: <10 IU/mL (ref 0.0–13.9)

## 2017-12-27 ENCOUNTER — Other Ambulatory Visit: Payer: Self-pay | Admitting: Family

## 2018-01-09 ENCOUNTER — Ambulatory Visit: Payer: Medicare Other | Admitting: Family

## 2018-01-09 ENCOUNTER — Other Ambulatory Visit: Payer: Medicare Other

## 2018-01-24 ENCOUNTER — Other Ambulatory Visit: Payer: Self-pay | Admitting: Family

## 2018-03-01 ENCOUNTER — Encounter: Payer: Self-pay | Admitting: Podiatry

## 2018-03-01 ENCOUNTER — Ambulatory Visit (INDEPENDENT_AMBULATORY_CARE_PROVIDER_SITE_OTHER): Payer: Medicare Other | Admitting: Podiatry

## 2018-03-01 ENCOUNTER — Inpatient Hospital Stay: Payer: Medicare Other

## 2018-03-01 ENCOUNTER — Inpatient Hospital Stay: Payer: Medicare Other | Attending: Family | Admitting: Family

## 2018-03-01 ENCOUNTER — Other Ambulatory Visit: Payer: Self-pay

## 2018-03-01 ENCOUNTER — Encounter: Payer: Self-pay | Admitting: Family

## 2018-03-01 ENCOUNTER — Ambulatory Visit (INDEPENDENT_AMBULATORY_CARE_PROVIDER_SITE_OTHER): Payer: Medicare Other

## 2018-03-01 VITALS — BP 107/79 | HR 65 | Temp 97.5°F | Resp 20 | Wt 251.0 lb

## 2018-03-01 VITALS — BP 98/64 | HR 64

## 2018-03-01 DIAGNOSIS — D500 Iron deficiency anemia secondary to blood loss (chronic): Secondary | ICD-10-CM

## 2018-03-01 DIAGNOSIS — D45 Polycythemia vera: Secondary | ICD-10-CM

## 2018-03-01 DIAGNOSIS — M7672 Peroneal tendinitis, left leg: Secondary | ICD-10-CM | POA: Diagnosis not present

## 2018-03-01 DIAGNOSIS — E119 Type 2 diabetes mellitus without complications: Secondary | ICD-10-CM | POA: Insufficient documentation

## 2018-03-01 DIAGNOSIS — E039 Hypothyroidism, unspecified: Secondary | ICD-10-CM | POA: Diagnosis not present

## 2018-03-01 DIAGNOSIS — F419 Anxiety disorder, unspecified: Secondary | ICD-10-CM | POA: Insufficient documentation

## 2018-03-01 DIAGNOSIS — Z794 Long term (current) use of insulin: Secondary | ICD-10-CM

## 2018-03-01 DIAGNOSIS — M2042 Other hammer toe(s) (acquired), left foot: Secondary | ICD-10-CM

## 2018-03-01 LAB — CBC WITH DIFFERENTIAL (CANCER CENTER ONLY)
BASOS PCT: 3 %
Basophils Absolute: 0.3 10*3/uL — ABNORMAL HIGH (ref 0.0–0.1)
Eosinophils Absolute: 0.4 10*3/uL (ref 0.0–0.5)
Eosinophils Relative: 5 %
HEMATOCRIT: 47.2 % (ref 38.7–49.9)
HEMOGLOBIN: 14.5 g/dL (ref 13.0–17.1)
LYMPHS ABS: 2.3 10*3/uL (ref 0.9–3.3)
Lymphocytes Relative: 26 %
MCH: 21.1 pg — AB (ref 28.0–33.4)
MCHC: 30.7 g/dL — AB (ref 32.0–35.9)
MCV: 68.7 fL — ABNORMAL LOW (ref 82.0–98.0)
MONO ABS: 0.7 10*3/uL (ref 0.1–0.9)
MONOS PCT: 8 %
Neutro Abs: 4.9 10*3/uL (ref 1.5–6.5)
Neutrophils Relative %: 58 %
Platelet Count: 206 10*3/uL (ref 145–400)
RBC: 6.87 MIL/uL — ABNORMAL HIGH (ref 4.20–5.70)
RDW: 21 % — AB (ref 11.1–15.7)
WBC Count: 8.6 10*3/uL (ref 4.0–10.0)

## 2018-03-01 LAB — CMP (CANCER CENTER ONLY)
ALBUMIN: 4.5 g/dL (ref 3.5–5.0)
ALK PHOS: 108 U/L (ref 38–126)
ALT: 33 U/L (ref 0–44)
ANION GAP: 12 (ref 5–15)
AST: 25 U/L (ref 15–41)
BUN: 25 mg/dL — ABNORMAL HIGH (ref 8–23)
CALCIUM: 10.4 mg/dL — AB (ref 8.9–10.3)
CO2: 31 mmol/L (ref 22–32)
Chloride: 101 mmol/L (ref 98–111)
Creatinine: 1.57 mg/dL — ABNORMAL HIGH (ref 0.61–1.24)
GFR, EST AFRICAN AMERICAN: 52 mL/min — AB (ref >60)
GFR, Estimated: 45 mL/min — ABNORMAL LOW (ref >60)
GLUCOSE: 194 mg/dL — AB (ref 70–99)
POTASSIUM: 4.4 mmol/L (ref 3.5–5.1)
Sodium: 144 mmol/L (ref 135–145)
TOTAL PROTEIN: 7.6 g/dL (ref 6.5–8.1)
Total Bilirubin: 0.4 mg/dL (ref 0.3–1.2)

## 2018-03-01 LAB — RETICULOCYTES
RBC.: 6.66 MIL/uL — AB (ref 4.20–5.82)
RETIC COUNT ABSOLUTE: 106.6 10*3/uL — AB (ref 34.8–93.9)
RETIC CT PCT: 1.6 % (ref 0.8–1.8)

## 2018-03-01 LAB — HEMOGLOBIN A1C
HEMOGLOBIN A1C: 6.3 % — AB (ref 4.8–5.6)
Mean Plasma Glucose: 134.11 mg/dL

## 2018-03-01 MED ORDER — MELOXICAM 15 MG PO TABS: 15.0000 mg | ORAL_TABLET | Freq: Every day | ORAL | 1 refills | Status: AC

## 2018-03-01 MED ORDER — SODIUM CHLORIDE 0.9 % IV SOLN
INTRAVENOUS | Status: DC
  Administered 2018-03-01: 12:00:00 via INTRAVENOUS

## 2018-03-01 NOTE — Progress Notes (Signed)
Chase Gutierrez presents today for phlebotomy per MD orders. Phlebotomy procedure started at 1130 and ended at 1145 with  500 grams removed via 18 G to R forearm. IVF hydration provided. Patient observed for 30 minutes after procedure without any incident. Patient tolerated procedure well. Diet and nutrition offered.

## 2018-03-01 NOTE — Patient Instructions (Signed)
Therapeutic Phlebotomy Therapeutic phlebotomy is the controlled removal of blood from a person's body for the purpose of treating a medical condition. The procedure is similar to donating blood. Usually, about a pint (470 mL, or 0.47L) of blood is removed. The average adult has 9-12 pints (4.3-5.7 L) of blood. Therapeutic phlebotomy may be used to treat the following medical conditions:  Hemochromatosis. This is a condition in which the blood contains too much iron.  Polycythemia vera. This is a condition in which the blood contains too many red blood cells.  Porphyria cutanea tarda. This is a disease in which an important part of hemoglobin is not made properly. It results in the buildup of abnormal amounts of porphyrins in the body.  Sickle cell disease. This is a condition in which the red blood cells form an abnormal crescent shape rather than a round shape.  Tell a health care provider about:  Any allergies you have.  All medicines you are taking, including vitamins, herbs, eye drops, creams, and over-the-counter medicines.  Any problems you or family members have had with anesthetic medicines.  Any blood disorders you have.  Any surgeries you have had.  Any medical conditions you have. What are the risks? Generally, this is a safe procedure. However, problems may occur, including:  Nausea or light-headedness.  Low blood pressure.  Soreness, bleeding, swelling, or bruising at the needle insertion site.  Infection.  What happens before the procedure?  Follow instructions from your health care provider about eating or drinking restrictions.  Ask your health care provider about changing or stopping your regular medicines. This is especially important if you are taking diabetes medicines or blood thinners.  Wear clothing with sleeves that can be raised above the elbow.  Plan to have someone take you home after the procedure.  You may have a blood sample taken. What  happens during the procedure?  A needle will be inserted into one of your veins.  Tubing and a collection bag will be attached to that needle.  Blood will flow through the needle and tubing into the collection bag.  You may be asked to open and close your hand slowly and continually during the entire collection.  After the specified amount of blood has been removed from your body, the collection bag and tubing will be clamped.  The needle will be removed from your vein.  Pressure will be held on the site of the needle insertion to stop the bleeding.  A bandage (dressing) will be placed over the needle insertion site. The procedure may vary among health care providers and hospitals. What happens after the procedure?  Your recovery will be assessed and monitored.  You can return to your normal activities as directed by your health care provider. This information is not intended to replace advice given to you by your health care provider. Make sure you discuss any questions you have with your health care provider. Document Released: 01/18/2011 Document Revised: 04/17/2016 Document Reviewed: 08/12/2014 Elsevier Interactive Patient Education  2018 Elsevier Inc.  

## 2018-03-01 NOTE — Progress Notes (Signed)
   Subjective:    Patient ID: Chase Gutierrez, male    DOB: 1950-10-22, 67 y.o.   MRN: [id number]  HPI    Review of Systems  All other systems reviewed and are negative.      Objective:   Physical Exam        Assessment & Plan:

## 2018-03-01 NOTE — Progress Notes (Signed)
Hematology and Oncology Follow Up Visit  Chase Gutierrez [id number] [birth year] 67 y.o. 03/01/2018   Principle Diagnosis:  Polycythemia vera - JAK2 NEGATIVE  Current Therapy:   Phlebotomy to maintain hematocrit below 40% Aspirin 81 mg by mouth daily   Interim History:  Chase Gutierrez is here today for follow-up. He is symptomatic with fatigue, SOB with over exertion, joint pain, tinnitus, headaches and blurry vision at times.  He has had no fever, chills, n/v, cough, rash, dizziness, SOB, chest pain, palpitations, abdominal pain or changes in bowel or bladder habits.  No falls or syncope.  No bruising or petechiae. No lymphadenopathy noted on exam.  He has numbness and tingling in his feet that comes and goes. No swelling, numbness or tingling in his extremities at this time.  He has maintained a good appetite and is still doing well on [name]. He has lost another 15 lbs since we last saw him. He is staying well hydrated.   ECOG Performance Status: 1 - Symptomatic but completely ambulatory  Medications:  Allergies as of 03/01/2018      Reactions   Provigil [modafinil] Hives      Medication List        Accurate as of 03/01/18 11:03 AM. Always use your most recent med list.          ALPRAZolam 0.5 MG tablet Commonly known as:  XANAX TAKE ONE TABLET TWICE DAILY AS NEEDED FOR ANXIETY   amitriptyline 25 MG tablet Commonly known as:  ELAVIL Take 25 mg by mouth at bedtime.   aspirin 81 MG EC tablet Take 81 mg by mouth daily.   blood glucose meter kit and supplies Dispense based on patient and insurance preference. Use up to four times daily as directed. (FOR ICD-9 250.00, 250.01).   colchicine 0.6 MG tablet Commonly known as:  COLCRYS Take 1 tablet (0.6 mg total) by mouth as needed.   [name] PO Take 2 tablets by mouth 2 (two) times daily.   furosemide 40 MG tablet Commonly known as:  LASIX TAKE ONE TABLET EVERY DAY   hydrochlorothiazide 12.5 MG tablet Commonly  known as:  HYDRODIURIL Take 12.5 mg by mouth daily.   hydrOXYzine 10 MG tablet Commonly known as:  ATARAX/VISTARIL TAKE ONE TABLET THREE TIMES DAILY AS NEEDED FOR ITCHING   ibuprofen 400 MG tablet Commonly known as:  ADVIL,MOTRIN Take 400 mg by mouth as needed.   metoprolol tartrate 25 MG tablet Commonly known as:  LOPRESSOR Take 1 tablet (25 mg total) by mouth 2 (two) times daily.   multivitamin capsule Take 1 capsule by mouth daily.   naproxen 500 MG tablet Commonly known as:  NAPROSYN Take 250 mg by mouth as needed.   NON FORMULARY Take by mouth every morning. VITA-PULSE   ONE TOUCH ULTRA TEST test strip Generic drug:  glucose blood CHECK BLOOD GLUCOSE (SUGAR) UPTO 4 TIMESDAILY AS DIRECTED   ONETOUCH DELICA LANCETS FINE Misc CHECK BLOOD GLUCOSE (SUGAR) UPTO 4 TIMESDAILY AS DIRECTED   potassium chloride SA 20 MEQ tablet Commonly known as:  K-DUR,KLOR-CON Take 20 mEq by mouth once. Pt. States he takes when he uses Lasix   SYNTHROID 50 MCG tablet Generic drug:  levothyroxine 50 mcg daily.   triamcinolone 0.1 % paste Commonly known as:  KENALOG Use as directed 1 application in the mouth or throat 2 (two) times daily.   TRULICITY 1.5 IP/3.8SN Sopn Generic drug:  Dulaglutide once a week.       Allergies:  Allergies  Allergen Reactions  . Provigil [Modafinil] Hives    Past Medical History, Surgical history, Social history, and Family History were reviewed and updated.  Review of Systems: All other 10 point review of systems is negative.   Physical Exam:  weight is 251 lb (113.9 kg). His oral temperature is 97.5 F (36.4 C) (abnormal). His blood pressure is 107/79 and his pulse is 65. His respiration is 20 and oxygen saturation is 98%.   Wt Readings from Last 3 Encounters:  03/01/18 251 lb (113.9 kg)  11/18/17 269 lb (122 kg)  11/14/17 266 lb (120.7 kg)    Ocular: Sclerae unicteric, pupils equal, round and reactive to light Ear-nose-throat:  Oropharynx clear, dentition fair Lymphatic: No cervical, supraclavicular or axillary adenopathy Lungs no rales or rhonchi, good excursion bilaterally Heart regular rate and rhythm, no murmur appreciated Abd soft, nontender, positive bowel sounds, no liver or spleen tip palpated on exam, no fluid wave  MSK no focal spinal tenderness, no joint edema Neuro: non-focal, well-oriented, appropriate affect Breasts: Deferred   Lab Results  Component Value Date   WBC 8.6 03/01/2018   HGB 14.5 03/01/2018   HCT 47.2 03/01/2018   MCV 68.7 (L) 03/01/2018   PLT 206 03/01/2018   Lab Results  Component Value Date   FERRITIN 10 (L) 11/14/2017   IRON 40 (L) 11/14/2017   TIBC 400 11/14/2017   UIBC 360 11/14/2017   IRONPCTSAT 10 (L) 11/14/2017   Lab Results  Component Value Date   RETICCTPCT 1.8 08/08/2013   RBC 6.87 (H) 03/01/2018   RETICCTABS 93.6 08/08/2013   No results found for: KPAFRELGTCHN, LAMBDASER, KAPLAMBRATIO No results found for: IGGSERUM, IGA, IGMSERUM No results found for: Odetta Pink, SPEI   Chemistry      Component Value Date/Time   NA 141 11/14/2017 1013   NA 144 08/04/2017 1438   NA 143 01/14/2017 0846   K 3.9 11/14/2017 1013   K 4.3 08/04/2017 1438   K 3.6 01/14/2017 0846   CL 100 11/14/2017 1013   CL 101 08/04/2017 1438   CO2 28 11/14/2017 1013   CO2 32 08/04/2017 1438   CO2 31 (H) 01/14/2017 0846   BUN 21 11/14/2017 1013   BUN 17 08/04/2017 1438   BUN 22.2 01/14/2017 0846   CREATININE 1.44 (H) 11/14/2017 1013   CREATININE 1.4 (H) 08/04/2017 1438   CREATININE 1.5 (H) 01/14/2017 0846      Component Value Date/Time   CALCIUM 9.6 11/14/2017 1013   CALCIUM 9.3 08/04/2017 1438   CALCIUM 9.9 01/14/2017 0846   ALKPHOS 98 11/14/2017 1013   ALKPHOS 87 (H) 08/04/2017 1438   ALKPHOS 87 01/14/2017 0846   AST 29 11/14/2017 1013   AST 26 01/14/2017 0846   ALT 37 11/14/2017 1013   ALT 35 08/04/2017 1438   ALT 29  01/14/2017 0846   BILITOT 0.4 11/14/2017 1013   BILITOT 0.54 01/14/2017 0846      Impression and Plan: Chase Gutierrez is a very pleasant 67 yo caucasian gentleman with polycythemia, JAK-2 negative. Hct is 47.2% and he is symptomatic as mentioned above.  We will phlebotomize him today and give replacement fluids and for a second time next week.  We will him again once he returns from [place] in September.  He will contact our office with any questions or concerns. We can certainly see him sooner if need be.   Laverna Peace, NP 7/3/201911:03 AM

## 2018-03-03 LAB — IRON AND TIBC
Iron: 41 ug/dL — ABNORMAL LOW (ref 42–163)
Saturation Ratios: 9 % — ABNORMAL LOW (ref 42–163)
TIBC: 453 ug/dL — ABNORMAL HIGH (ref 202–409)
UIBC: 412 ug/dL

## 2018-03-03 LAB — FERRITIN: FERRITIN: 10 ng/mL — AB (ref 24–336)

## 2018-03-07 ENCOUNTER — Inpatient Hospital Stay: Payer: Medicare Other

## 2018-03-07 ENCOUNTER — Ambulatory Visit: Payer: Medicare Other | Admitting: Family

## 2018-03-07 ENCOUNTER — Other Ambulatory Visit: Payer: Medicare Other

## 2018-03-07 ENCOUNTER — Other Ambulatory Visit: Payer: Self-pay

## 2018-03-07 VITALS — BP 97/63 | HR 66 | Temp 97.8°F | Resp 20

## 2018-03-07 DIAGNOSIS — D45 Polycythemia vera: Secondary | ICD-10-CM

## 2018-03-07 DIAGNOSIS — E119 Type 2 diabetes mellitus without complications: Secondary | ICD-10-CM | POA: Diagnosis not present

## 2018-03-07 DIAGNOSIS — E039 Hypothyroidism, unspecified: Secondary | ICD-10-CM | POA: Diagnosis not present

## 2018-03-07 DIAGNOSIS — F419 Anxiety disorder, unspecified: Secondary | ICD-10-CM | POA: Diagnosis not present

## 2018-03-07 MED ORDER — SODIUM CHLORIDE 0.9 % IV SOLN
INTRAVENOUS | Status: DC
  Administered 2018-03-07: 11:00:00 via INTRAVENOUS

## 2018-03-07 NOTE — Patient Instructions (Signed)

## 2018-03-07 NOTE — Progress Notes (Signed)
Chase Gutierrez presents today for phlebotomy per MD orders. Phlebotomy procedure started at 1040 and ended at 1055. 500 grams removed. Used 20G cannula and gave 1044ml NS afterwards. Patient observed for 30 minutes after procedure without any incident. Patient tolerated procedure well. IV needle removed intact.

## 2018-03-09 NOTE — Progress Notes (Signed)
   HPI: 67 year old male presenting today as a new patient with a chief complaint of a nodule noted to the left foot that appeared a few weeks ago. He reports associated pain and discoloration of the area stating it intermittently turns purple. He has not done anything for treatment. Walking and standing for long periods of time increases the pain. Patient is here for further evaluation and treatment.   Past Medical History:  Diagnosis Date  . Adenomatous colon polyp    2005  . Allergic rhinitis   . Aortic stenosis   . Depressed   . Dyslipidemia   . Hypertension   . Nephrolithiasis   . Obesity   . OSA (obstructive sleep apnea)   . Polycythemia vera(238.4) 06/16/2012  . Sinus complaint      Physical Exam: General: The patient is alert and oriented x3 in no acute distress.  Dermatology: Skin is warm, dry and supple bilateral lower extremities. Negative for open lesions or macerations.  Vascular: Palpable pedal pulses bilaterally. No edema or erythema noted. Capillary refill within normal limits.  Neurological: Epicritic and protective threshold grossly intact bilaterally.   Musculoskeletal Exam: Pain with palpation to the insertion of the peroneal tendon of the left foot. Range of motion within normal limits to all pedal and ankle joints bilateral. Muscle strength 5/5 in all groups bilateral.   Radiographic Exam:  Prominent 5th metatarsal tubercle noted to bilateral feet. No fracture/dislocation/boney destruction.    Assessment: 1. Insertional peroneal tendinitis left   Plan of Care:  1. Patient evaluated. X-Rays reviewed.  2. Injection of 0.5 mLs Celestone Soluspan injected into the peroneal tendon sheath of the left foot.  3. Prescription for Meloxicam provided to patient.  4. Recommended good shoe gear.  5. Return to clinic as needed.   Lives in [place] 10 months out of the year.      Edrick Kins, DPM [facility]  Dr. Edrick Kins, DPM    [address], [place] [address]                Office [phone]  Fax [phone]

## 2018-05-02 ENCOUNTER — Telehealth: Payer: Self-pay | Admitting: Neurology

## 2018-05-02 NOTE — Telephone Encounter (Signed)
Called the patient because he called and spoke with someone about needing to be seen. We last saw him in dec 2016. Pt is still within the 3 year mark and a referral is not needed. Contacted to see what the patient's concern was. He states he feels a pressure change should be made on the CPAP. I looked at download and he is having apneas. I was able to get the patient in for apt on 05/09/18 at 1:30 pm. Advised the patient to check in at 1 pm. Pt verbalized understanding and was appreciative for getting him worked in.

## 2018-05-04 ENCOUNTER — Other Ambulatory Visit: Payer: Self-pay

## 2018-05-04 ENCOUNTER — Inpatient Hospital Stay: Payer: Medicare Other

## 2018-05-04 ENCOUNTER — Inpatient Hospital Stay: Payer: Medicare Other | Attending: Family | Admitting: Family

## 2018-05-04 VITALS — BP 139/92 | HR 67 | Temp 98.4°F | Resp 18 | Wt 261.0 lb

## 2018-05-04 VITALS — BP 142/87

## 2018-05-04 DIAGNOSIS — D500 Iron deficiency anemia secondary to blood loss (chronic): Secondary | ICD-10-CM

## 2018-05-04 DIAGNOSIS — E119 Type 2 diabetes mellitus without complications: Secondary | ICD-10-CM | POA: Insufficient documentation

## 2018-05-04 DIAGNOSIS — Z79899 Other long term (current) drug therapy: Secondary | ICD-10-CM | POA: Diagnosis not present

## 2018-05-04 DIAGNOSIS — Z7982 Long term (current) use of aspirin: Secondary | ICD-10-CM | POA: Diagnosis not present

## 2018-05-04 DIAGNOSIS — D45 Polycythemia vera: Secondary | ICD-10-CM | POA: Insufficient documentation

## 2018-05-04 DIAGNOSIS — Z794 Long term (current) use of insulin: Secondary | ICD-10-CM

## 2018-05-04 LAB — CMP (CANCER CENTER ONLY)
ALT: 49 U/L — ABNORMAL HIGH (ref 10–47)
ANION GAP: 7 (ref 5–15)
AST: 38 U/L (ref 11–38)
Albumin: 3.8 g/dL (ref 3.5–5.0)
Alkaline Phosphatase: 108 U/L — ABNORMAL HIGH (ref 26–84)
BUN: 17 mg/dL (ref 7–22)
CALCIUM: 9.2 mg/dL (ref 8.0–10.3)
CO2: 31 mmol/L (ref 18–33)
Chloride: 101 mmol/L (ref 98–108)
Creatinine: 1.2 mg/dL (ref 0.60–1.20)
GLUCOSE: 275 mg/dL — AB (ref 73–118)
POTASSIUM: 3.7 mmol/L (ref 3.3–4.7)
Sodium: 139 mmol/L (ref 128–145)
Total Bilirubin: 0.7 mg/dL (ref 0.2–1.6)
Total Protein: 6.8 g/dL (ref 6.4–8.1)

## 2018-05-04 LAB — CBC WITH DIFFERENTIAL/PLATELET
Basophils Absolute: 0.2 10*3/uL — ABNORMAL HIGH (ref 0.0–0.1)
Basophils Relative: 3 %
EOS ABS: 0.4 10*3/uL (ref 0.0–0.7)
Eosinophils Relative: 5 %
HCT: 39.8 % (ref 39.0–52.0)
HEMOGLOBIN: 12.2 g/dL — AB (ref 13.0–17.0)
LYMPHS ABS: 1.7 10*3/uL (ref 0.7–4.0)
Lymphocytes Relative: 23 %
MCH: 20.7 pg — ABNORMAL LOW (ref 26.0–34.0)
MCHC: 30.7 g/dL (ref 30.0–36.0)
MCV: 67.5 fL — ABNORMAL LOW (ref 78.0–100.0)
MONO ABS: 0.6 10*3/uL (ref 0.1–1.0)
Monocytes Relative: 8 %
NEUTROS PCT: 61 %
Neutro Abs: 4.3 10*3/uL (ref 1.7–7.7)
PLATELETS: 170 10*3/uL (ref 150–400)
RBC: 5.9 MIL/uL — AB (ref 4.22–5.81)
RDW: 19.8 % — ABNORMAL HIGH (ref 11.5–15.5)
WBC: 7.2 10*3/uL (ref 4.0–10.5)

## 2018-05-04 LAB — RETICULOCYTES
RBC.: 5.74 MIL/uL (ref 4.20–5.82)
RETIC COUNT ABSOLUTE: 109.1 10*3/uL — AB (ref 34.8–93.9)
Retic Ct Pct: 1.9 % — ABNORMAL HIGH (ref 0.8–1.8)

## 2018-05-04 LAB — HEMOGLOBIN A1C
Hgb A1c MFr Bld: 7 % — ABNORMAL HIGH (ref 4.8–5.6)
Mean Plasma Glucose: 154.2 mg/dL

## 2018-05-04 MED ORDER — SODIUM CHLORIDE 0.9 % IV SOLN
INTRAVENOUS | Status: DC
  Administered 2018-05-04: 13:00:00 via INTRAVENOUS
  Filled 2018-05-04 (×2): qty 250

## 2018-05-04 NOTE — Progress Notes (Signed)
Hematology and Oncology Follow Up Visit  Chase Gutierrez [id number] Mar 05, 1951 67 y.o. 05/04/2018   Principle Diagnosis:  Polycythemia vera - JAK2 NEGATIVE  Current Therapy:   Phlebotomy to maintain hematocrit below 40% Aspirin 81 mg by mouth daily   Interim History:  Chase Gutierrez is here today for follow-up. He is doing fairly well but is starting to have some fatigue, joint aches and more frequent headaches. Hct today is 39.8%.  He has had no fever, chills, n/v, cough, rash, dizziness, SOB, chest pain, palpitations, abdominal pain or changes in bowel or bladder habits.  No swelling or tenderness in his extremities. The intermittent numbness and tingling in his hands and feet is stable.  No lymphadenopathy noted on exam.  No episodes of bleeding. No bruising or petechiae.  He has maintained a good appetite and is making sure to hydrate well. His weight is up 10 lbs since his last visit. His mother was recently hospitalized and he states while he stayed with her he was not eating healthy. His blood sugars are up and down. He has not had a meter at home to check them.   ECOG Performance Status: 1 - Symptomatic but completely ambulatory  Medications:  Allergies as of 05/04/2018      Reactions   Provigil [modafinil] Hives      Medication List        Accurate as of 05/04/18  2:23 PM. Always use your most recent med list.          ALPRAZolam 0.5 MG tablet Commonly known as:  XANAX TAKE ONE TABLET TWICE DAILY AS NEEDED FOR ANXIETY   amitriptyline 25 MG tablet Commonly known as:  ELAVIL Take 25 mg by mouth at bedtime.   aspirin 81 MG EC tablet Take 81 mg by mouth daily.   blood glucose meter kit and supplies Dispense based on patient and insurance preference. Use up to four times daily as directed. (FOR ICD-9 250.00, 250.01).   colchicine 0.6 MG tablet Take 1 tablet (0.6 mg total) by mouth as needed.   [name] PO Take 2 tablets by mouth 2 (two) times daily.     furosemide 40 MG tablet Commonly known as:  LASIX TAKE ONE TABLET EVERY DAY   hydrochlorothiazide 12.5 MG tablet Commonly known as:  HYDRODIURIL Take 12.5 mg by mouth daily.   hydrOXYzine 10 MG tablet Commonly known as:  ATARAX/VISTARIL TAKE ONE TABLET THREE TIMES DAILY AS NEEDED FOR ITCHING   ibuprofen 400 MG tablet Commonly known as:  ADVIL,MOTRIN Take 400 mg by mouth as needed.   metoprolol tartrate 25 MG tablet Commonly known as:  LOPRESSOR Take 1 tablet (25 mg total) by mouth 2 (two) times daily.   multivitamin capsule Take 1 capsule by mouth daily.   naproxen 500 MG tablet Commonly known as:  NAPROSYN Take 250 mg by mouth as needed.   NON FORMULARY Take by mouth every morning. VITA-PULSE   ONE TOUCH ULTRA TEST test strip Generic drug:  glucose blood CHECK BLOOD GLUCOSE (SUGAR) UPTO 4 TIMESDAILY AS DIRECTED   ONETOUCH DELICA LANCETS FINE Misc CHECK BLOOD GLUCOSE (SUGAR) UPTO 4 TIMESDAILY AS DIRECTED   potassium chloride SA 20 MEQ tablet Commonly known as:  K-DUR,KLOR-CON Take 20 mEq by mouth once. Pt. States he takes when he uses Lasix   SYNTHROID 50 MCG tablet Generic drug:  levothyroxine 50 mcg daily.   triamcinolone 0.1 % paste Commonly known as:  KENALOG Use as directed 1 application in the mouth or  throat 2 (two) times daily.   TRULICITY 1.5 VV/6.1YW Sopn Generic drug:  Dulaglutide once a week.       Allergies:  Allergies  Allergen Reactions  . Provigil [Modafinil] Hives    Past Medical History, Surgical history, Social history, and Family History were reviewed and updated.  Review of Systems: All other 10 point review of systems is negative.   Physical Exam:  weight is 261 lb (118.4 kg). His oral temperature is 98.4 F (36.9 C). His blood pressure is 139/92 (abnormal) and his pulse is 67. His respiration is 18 and oxygen saturation is 98%.   Wt Readings from Last 3 Encounters:  05/04/18 261 lb (118.4 kg)  03/01/18 251 lb (113.9 kg)   11/18/17 269 lb (122 kg)    Ocular: Sclerae unicteric, pupils equal, round and reactive to light Ear-nose-throat: Oropharynx clear, dentition fair Lymphatic: No cervical, supraclavicular or axillary adenopathy Lungs no rales or rhonchi, good excursion bilaterally Heart regular rate and rhythm, no murmur appreciated Abd soft, nontender, positive bowel sounds, no liver or spleen tip palpated on exam, no fluid wave  MSK no focal spinal tenderness, no joint edema Neuro: non-focal, well-oriented, appropriate affect Breasts: Deferred   Lab Results  Component Value Date   WBC 7.2 05/04/2018   HGB 12.2 (L) 05/04/2018   HCT 39.8 05/04/2018   MCV 67.5 (L) 05/04/2018   PLT 170 05/04/2018   Lab Results  Component Value Date   FERRITIN 10 (L) 03/01/2018   IRON 41 (L) 03/01/2018   TIBC 453 (H) 03/01/2018   UIBC 412 03/01/2018   IRONPCTSAT 9 (L) 03/01/2018   Lab Results  Component Value Date   RETICCTPCT 1.9 (H) 05/04/2018   RBC 5.90 (H) 05/04/2018   RETICCTABS 93.6 08/08/2013   No results found for: KPAFRELGTCHN, LAMBDASER, KAPLAMBRATIO No results found for: IGGSERUM, IGA, IGMSERUM No results found for: Kathrynn Ducking, MSPIKE, SPEI   Chemistry      Component Value Date/Time   NA 139 05/04/2018 1043   NA 144 08/04/2017 1438   NA 143 01/14/2017 0846   K 3.7 05/04/2018 1043   K 4.3 08/04/2017 1438   K 3.6 01/14/2017 0846   CL 101 05/04/2018 1043   CL 101 08/04/2017 1438   CO2 31 05/04/2018 1043   CO2 32 08/04/2017 1438   CO2 31 (H) 01/14/2017 0846   BUN 17 05/04/2018 1043   BUN 17 08/04/2017 1438   BUN 22.2 01/14/2017 0846   CREATININE 1.20 05/04/2018 1043   CREATININE 1.4 (H) 08/04/2017 1438   CREATININE 1.5 (H) 01/14/2017 0846      Component Value Date/Time   CALCIUM 9.2 05/04/2018 1043   CALCIUM 9.3 08/04/2017 1438   CALCIUM 9.9 01/14/2017 0846   ALKPHOS 108 (H) 05/04/2018 1043   ALKPHOS 87 (H) 08/04/2017 1438   ALKPHOS  87 01/14/2017 0846   AST 38 05/04/2018 1043   AST 26 01/14/2017 0846   ALT 49 (H) 05/04/2018 1043   ALT 35 08/04/2017 1438   ALT 29 01/14/2017 0846   BILITOT 0.7 05/04/2018 1043   BILITOT 0.54 01/14/2017 0846      Impression and Plan: Chase Gutierrez is a very pleasant 67 yo caucasian gentleman with polycythemia, JAK-2 negative. Hct today is 39.8% and he is symptomatic as mentioned above. He would like to proceed with phlebotomy today.  He will continue taking his baby aspirin daily.  We will go ahead and plan to see him back the second  week of December once a returns again from [place].  He will contact our office with any questions or concerns. We can certainly see him sooner if need be.   Laverna Peace, NP 9/5/20192:23 PM

## 2018-05-04 NOTE — Patient Instructions (Signed)
Therapeutic Phlebotomy Therapeutic phlebotomy is the controlled removal of blood from a person's body for the purpose of treating a medical condition. The procedure is similar to donating blood. Usually, about a pint (470 mL, or 0.47L) of blood is removed. The average adult has 9-12 pints (4.3-5.7 L) of blood. Therapeutic phlebotomy may be used to treat the following medical conditions:  Hemochromatosis. This is a condition in which the blood contains too much iron.  Polycythemia vera. This is a condition in which the blood contains too many red blood cells.  Porphyria cutanea tarda. This is a disease in which an important part of hemoglobin is not made properly. It results in the buildup of abnormal amounts of porphyrins in the body.  Sickle cell disease. This is a condition in which the red blood cells form an abnormal crescent shape rather than a round shape.  Tell a health care provider about:  Any allergies you have.  All medicines you are taking, including vitamins, herbs, eye drops, creams, and over-the-counter medicines.  Any problems you or family members have had with anesthetic medicines.  Any blood disorders you have.  Any surgeries you have had.  Any medical conditions you have. What are the risks? Generally, this is a safe procedure. However, problems may occur, including:  Nausea or light-headedness.  Low blood pressure.  Soreness, bleeding, swelling, or bruising at the needle insertion site.  Infection.  What happens before the procedure?  Follow instructions from your health care provider about eating or drinking restrictions.  Ask your health care provider about changing or stopping your regular medicines. This is especially important if you are taking diabetes medicines or blood thinners.  Wear clothing with sleeves that can be raised above the elbow.  Plan to have someone take you home after the procedure.  You may have a blood sample taken. What  happens during the procedure?  A needle will be inserted into one of your veins.  Tubing and a collection bag will be attached to that needle.  Blood will flow through the needle and tubing into the collection bag.  You may be asked to open and close your hand slowly and continually during the entire collection.  After the specified amount of blood has been removed from your body, the collection bag and tubing will be clamped.  The needle will be removed from your vein.  Pressure will be held on the site of the needle insertion to stop the bleeding.  A bandage (dressing) will be placed over the needle insertion site. The procedure may vary among health care providers and hospitals. What happens after the procedure?  Your recovery will be assessed and monitored.  You can return to your normal activities as directed by your health care provider. This information is not intended to replace advice given to you by your health care provider. Make sure you discuss any questions you have with your health care provider. Document Released: 01/18/2011 Document Revised: 04/17/2016 Document Reviewed: 08/12/2014 Elsevier Interactive Patient Education  2018 Elsevier Inc.  

## 2018-05-04 NOTE — Progress Notes (Signed)
1 unit therapeutic phlebotomy performed over 20 minutes using a 18 gauge Angio Cath via the left AC. Replacement fluids provided over 30 minutes with nourishment provided.

## 2018-05-05 ENCOUNTER — Ambulatory Visit (INDEPENDENT_AMBULATORY_CARE_PROVIDER_SITE_OTHER): Payer: Medicare Other | Admitting: Internal Medicine

## 2018-05-05 ENCOUNTER — Other Ambulatory Visit: Payer: Self-pay | Admitting: Internal Medicine

## 2018-05-05 ENCOUNTER — Encounter: Payer: Self-pay | Admitting: Internal Medicine

## 2018-05-05 VITALS — BP 120/78 | HR 72 | Ht 71.0 in | Wt 263.0 lb

## 2018-05-05 DIAGNOSIS — I714 Abdominal aortic aneurysm, without rupture, unspecified: Secondary | ICD-10-CM

## 2018-05-05 DIAGNOSIS — Z952 Presence of prosthetic heart valve: Secondary | ICD-10-CM | POA: Diagnosis not present

## 2018-05-05 DIAGNOSIS — I712 Thoracic aortic aneurysm, without rupture, unspecified: Secondary | ICD-10-CM

## 2018-05-05 DIAGNOSIS — I716 Thoracoabdominal aortic aneurysm, without rupture, unspecified: Secondary | ICD-10-CM

## 2018-05-05 DIAGNOSIS — R109 Unspecified abdominal pain: Secondary | ICD-10-CM

## 2018-05-05 LAB — IRON AND TIBC
IRON: 25 ug/dL — AB (ref 42–163)
Saturation Ratios: 6 % — ABNORMAL LOW (ref 42–163)
TIBC: 441 ug/dL — AB (ref 202–409)
UIBC: 416 ug/dL

## 2018-05-05 LAB — FERRITIN: Ferritin: <4 ng/mL — ABNORMAL LOW (ref 24–336)

## 2018-05-05 NOTE — Patient Instructions (Signed)
Your physician wants you to follow-up : Dr.Ross will call you with results   Your physician has requested that you have an echocardiogram. Echocardiography is a painless test that uses sound waves to create images of your heart. It provides your doctor with information about the size and shape of your heart and how well your heart's chambers and valves are working. This procedure takes approximately one hour. There are no restrictions for this procedure.  Please schedule CT abdomen/Aorta at [facility]  Nothing to eat or drink 4 hrs before test  You have had your lab work done already    Your physician recommends that you continue on your current medications as directed. Please refer to the Current Medication list given to you today.    If you need a refill on your cardiac medications before your next appointment, please call your pharmacy.      Thank you for choosing [facility] !

## 2018-05-05 NOTE — Progress Notes (Signed)
Cardiology Office Note   Date:  05/05/2018   ID:  Chase Gutierrez, DOB [birth year], MRN [id number]  PCP:  Marton Redwood, MD  Cardiologist:   Dorris Carnes, MD   F/U of HTN and AV dz     History of Present Illness: Chase Gutierrez is a 67 y.o. male with a history of AS  He is s/p AVR in 2010.  Also a history of HTN, thoracic aortic aneurysm, distolic CHF, PCV, sleep apnea.   I saw the pt in March 2019  SInce seen the pt says he has not felt well   Achy  Doesn't do much physical activity   No energy  He is not using CPAP   Not working   He denies CP   SOme SOB with activity     Says that his CPAP isnt working    Outpatient Medications Prior to Visit  Medication Sig Dispense Refill  . ALPRAZolam (XANAX) 0.5 MG tablet TAKE ONE TABLET TWICE DAILY AS NEEDED FOR ANXIETY 60 tablet 0  . amitriptyline (ELAVIL) 25 MG tablet Take 25 mg by mouth at bedtime.     Marland Kitchen aspirin 81 MG EC tablet Take 81 mg by mouth daily.    . blood glucose meter kit and supplies Dispense based on patient and insurance preference. Use up to four times daily as directed. (FOR ICD-9 250.00, 250.01). 1 each 0  . colchicine (COLCRYS) 0.6 MG tablet Take 1 tablet (0.6 mg total) by mouth as needed. 20 tablet 0  . furosemide (LASIX) 40 MG tablet TAKE ONE TABLET EVERY DAY (Patient taking differently: PRN) 30 tablet 6  . hydrochlorothiazide (HYDRODIURIL) 12.5 MG tablet Take 12.5 mg by mouth daily.      . hydrOXYzine (ATARAX/VISTARIL) 10 MG tablet TAKE ONE TABLET THREE TIMES DAILY AS NEEDED FOR ITCHING 90 tablet 2  . ibuprofen (ADVIL,MOTRIN) 400 MG tablet Take 400 mg by mouth as needed.      . metoprolol tartrate (LOPRESSOR) 25 MG tablet Take 1 tablet (25 mg total) by mouth 2 (two) times daily. 180 tablet 3  . Multiple Vitamin (MULTIVITAMIN) capsule Take 1 capsule by mouth daily.      . Naltrexone-Bupropion HCl ([name] PO) Take 2 tablets by mouth 2 (two) times daily.     . naproxen (NAPROSYN) 500 MG tablet Take 250 mg by  mouth as needed.     . NON FORMULARY Take by mouth every morning. VITA-PULSE    . ONE TOUCH ULTRA TEST test strip CHECK BLOOD GLUCOSE (SUGAR) UPTO 4 TIMESDAILY AS DIRECTED 100 each 3  . ONETOUCH DELICA LANCETS FINE MISC CHECK BLOOD GLUCOSE (SUGAR) UPTO 4 TIMESDAILY AS DIRECTED 100 each 6  . potassium chloride SA (K-DUR,KLOR-CON) 20 MEQ tablet Take 20 mEq by mouth once. Pt. States he takes when he uses Lasix    . SYNTHROID 50 MCG tablet 50 mcg daily.    Marland Kitchen triamcinolone (KENALOG) 0.1 % paste Use as directed 1 application in the mouth or throat 2 (two) times daily. 5 g 6  . TRULICITY 1.5 IN/8.6VE SOPN once a week.      No facility-administered medications prior to visit.      Allergies:   Provigil [modafinil]   Past Medical History:  Diagnosis Date  . Adenomatous colon polyp    2005  . Allergic rhinitis   . Aortic stenosis   . Depressed   . Dyslipidemia   . Hypertension   . Nephrolithiasis   . Obesity   .  OSA (obstructive sleep apnea)   . Polycythemia vera(238.4) 06/16/2012  . Sinus complaint     Past Surgical History:  Procedure Laterality Date  . AORTIC VALVE REPLACEMENT  10/15/2008  . [place]  . TONSILLECTOMY  1982, 1993     Social History:  The patient  reports that he has never smoked. He has never used smokeless tobacco. He reports that he drinks alcohol.   Family History:  The patient's family history includes Coronary artery disease in his father and mother; Diabetes in his father; Lung cancer in his father.    ROS:  Please see the history of present illness. All other systems are reviewed and  Negative to the above problem except as noted.    PHYSICAL EXAM: VS:  BP 120/78 (BP Location: Right Arm)   Pulse 72   Ht '5\' 11"'$  (1.803 m)   Wt 263 lb (119.3 kg)   SpO2 96%   BMI 36.68 kg/m   GEN: Morbidly obese 67 yo , in no acute distress  HEENT: normal  Neck: No JVD   No, carotid bruits, or masses Cardiac: RRR; Gr II/VI systolic murmur at base   NO , rubs, or gallops,  Triv LE edema    Respiratory:  clear to auscultation bilaterally, normal work of breathing GI: soft, nontender, nondistended, + BS  Obese   MS: no deformity Moving all extremities   Skin: warm and dry, no rash Neuro:  Strength and sensation are intact Psych: euthymic mood, full affect   EKG:  EKG is not  ordered today.    Lipid Panel    Component Value Date/Time   CHOL 149 11/21/2017 0751   TRIG 252 (H) 11/21/2017 0751   HDL 27 (L) 11/21/2017 0751   CHOLHDL 5.5 (H) 11/21/2017 0751   CHOLHDL 5.7 01/08/2015 1210   VLDL 35 01/08/2015 1210   LDLCALC 72 11/21/2017 0751   LDLDIRECT 101.0 07/01/2014 1102      Wt Readings from Last 3 Encounters:  05/05/18 263 lb (119.3 kg)  05/04/18 261 lb (118.4 kg)  03/01/18 251 lb (113.9 kg)      ASSESSMENT AND PLAN:  1  AV dz  S/p .  AV replacement  In 2010 with tissue valve Wil set up for echo to reeval gradients  2  Hx diastolic CHF  Volume does not appear to be bad   I am not convinced it is a cause for fatigue   Will review echo  3.  Aortic aneurysm. Will order CT to reevalulate   4.  HTN  BP is good  5  Lipids   Lipids in March 2019 LDL 72  HDL 27   Try to increase activity   6   PCV  Followed by P [facility]  7   OSA  Pt to see neurology soon    8  Morbid obesity   ENcouraged pt to work on  diet  And walk        Signed, Dorris Carnes, MD  05/05/2018 2:19 PM    [facility] [address], [place], [place]  [address] Phone: [phone]; Fax: [phone]

## 2018-05-08 ENCOUNTER — Ambulatory Visit (INDEPENDENT_AMBULATORY_CARE_PROVIDER_SITE_OTHER)
Admission: RE | Admit: 2018-05-08 | Discharge: 2018-05-08 | Disposition: A | Discharge status: Home / Self Care | Payer: Medicare Other | Source: Ambulatory Visit | Attending: Internal Medicine | Admitting: Internal Medicine

## 2018-05-08 DIAGNOSIS — I712 Thoracic aortic aneurysm, without rupture, unspecified: Secondary | ICD-10-CM

## 2018-05-08 DIAGNOSIS — L812 Freckles: Secondary | ICD-10-CM | POA: Diagnosis not present

## 2018-05-08 DIAGNOSIS — R39198 Other difficulties with micturition: Secondary | ICD-10-CM | POA: Diagnosis not present

## 2018-05-08 DIAGNOSIS — L570 Actinic keratosis: Secondary | ICD-10-CM | POA: Diagnosis not present

## 2018-05-08 DIAGNOSIS — R109 Unspecified abdominal pain: Secondary | ICD-10-CM | POA: Diagnosis not present

## 2018-05-08 DIAGNOSIS — Z85828 Personal history of other malignant neoplasm of skin: Secondary | ICD-10-CM | POA: Diagnosis not present

## 2018-05-08 DIAGNOSIS — L821 Other seborrheic keratosis: Secondary | ICD-10-CM | POA: Diagnosis not present

## 2018-05-08 DIAGNOSIS — L720 Epidermal cyst: Secondary | ICD-10-CM | POA: Diagnosis not present

## 2018-05-08 MED ORDER — IOPAMIDOL (ISOVUE-370) INJECTION 76%
100.0000 mL | Freq: Once | INTRAVENOUS | Status: AC | PRN
  Administered 2018-05-08: 100 mL via INTRAVENOUS

## 2018-05-09 ENCOUNTER — Encounter: Payer: Self-pay | Admitting: Neurology

## 2018-05-09 ENCOUNTER — Ambulatory Visit (INDEPENDENT_AMBULATORY_CARE_PROVIDER_SITE_OTHER): Payer: Medicare Other | Admitting: Neurology

## 2018-05-09 VITALS — BP 125/80 | HR 63 | Ht 71.0 in | Wt 261.0 lb

## 2018-05-09 DIAGNOSIS — G4731 Primary central sleep apnea: Secondary | ICD-10-CM | POA: Diagnosis not present

## 2018-05-09 DIAGNOSIS — R002 Palpitations: Secondary | ICD-10-CM | POA: Diagnosis not present

## 2018-05-09 DIAGNOSIS — R718 Other abnormality of red blood cells: Secondary | ICD-10-CM

## 2018-05-09 DIAGNOSIS — G4739 Other sleep apnea: Secondary | ICD-10-CM

## 2018-05-09 DIAGNOSIS — I359 Nonrheumatic aortic valve disorder, unspecified: Secondary | ICD-10-CM | POA: Diagnosis not present

## 2018-05-09 DIAGNOSIS — D500 Iron deficiency anemia secondary to blood loss (chronic): Secondary | ICD-10-CM | POA: Insufficient documentation
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
## 2018-05-10 ENCOUNTER — Ambulatory Visit (INDEPENDENT_AMBULATORY_CARE_PROVIDER_SITE_OTHER): Payer: Medicare Other | Admitting: Podiatry

## 2018-05-10 DIAGNOSIS — B070 Plantar wart: Secondary | ICD-10-CM | POA: Diagnosis not present

## 2018-05-12 ENCOUNTER — Other Ambulatory Visit: Payer: Self-pay

## 2018-05-12 ENCOUNTER — Ambulatory Visit (HOSPITAL_COMMUNITY): Payer: Medicare Other | Attending: Internal Medicine

## 2018-05-12 DIAGNOSIS — E785 Hyperlipidemia, unspecified: Secondary | ICD-10-CM | POA: Insufficient documentation

## 2018-05-12 DIAGNOSIS — I509 Heart failure, unspecified: Secondary | ICD-10-CM | POA: Insufficient documentation

## 2018-05-12 DIAGNOSIS — I719 Aortic aneurysm of unspecified site, without rupture: Secondary | ICD-10-CM | POA: Diagnosis not present

## 2018-05-12 DIAGNOSIS — Z952 Presence of prosthetic heart valve: Secondary | ICD-10-CM | POA: Diagnosis not present

## 2018-05-12 DIAGNOSIS — I358 Other nonrheumatic aortic valve disorders: Secondary | ICD-10-CM | POA: Insufficient documentation

## 2018-05-12 DIAGNOSIS — E669 Obesity, unspecified: Secondary | ICD-10-CM | POA: Insufficient documentation

## 2018-05-12 MED ORDER — PERFLUTREN LIPID MICROSPHERE
1.0000 mL | INTRAVENOUS | Status: AC | PRN
  Administered 2018-05-12: 2 mL via INTRAVENOUS

## 2018-05-16 NOTE — Progress Notes (Signed)
   Subjective: 67 year old male presenting today with a chief complaint of a painful lesion to the plantar aspect of the right forefoot that appeared several weeks ago. Walking and bearing weight increases the pain. He has not done anything for treatment. Patient is here for further evaluation and treatment.   Past Medical History:  Diagnosis Date  . Adenomatous colon polyp    2005  . Allergic rhinitis   . Aortic stenosis   . Depressed   . Dyslipidemia   . Hypertension   . Nephrolithiasis   . Obesity   . OSA (obstructive sleep apnea)   . Polycythemia vera(238.4) 06/16/2012  . Sinus complaint     Objective: Physical Exam General: The patient is alert and oriented x3 in no acute distress.  Dermatology: Hyperkeratotic skin lesion noted to the plantar aspect of the right foot approximately 1 cm in diameter. Pinpoint bleeding noted upon debridement. Skin is warm, dry and supple bilateral lower extremities. Negative for open lesions or macerations.  Vascular: Palpable pedal pulses bilaterally. No edema or erythema noted. Capillary refill within normal limits.  Neurological: Epicritic and protective threshold grossly intact bilaterally.   Musculoskeletal Exam: Pain on palpation to the note skin lesion.  Range of motion within normal limits to all pedal and ankle joints bilateral. Muscle strength 5/5 in all groups bilateral.   Assessment: #1 plantar wart right forefoot   Plan of Care:  #1 Patient was evaluated. #2 Excisional debridement of the plantar wart lesion was performed using a chisel blade. Salinocaine was applied and the lesion was dressed with a dry sterile dressing. #3 patient is to return to clinic as needed.   Lives in [place] 10 months out of the year.    Edrick Kins, DPM [facility]  Dr. Edrick Kins, [address]                                        [place], [place] [address]                Office [phone]  Fax [phone]

## 2018-05-24 ENCOUNTER — Ambulatory Visit: Payer: Medicare Other | Admitting: Neurology

## 2018-05-30 ENCOUNTER — Other Ambulatory Visit: Payer: Self-pay | Admitting: Family

## 2018-05-30 DIAGNOSIS — D45 Polycythemia vera: Secondary | ICD-10-CM

## 2018-05-31 ENCOUNTER — Inpatient Hospital Stay: Payer: Medicare Other

## 2018-05-31 ENCOUNTER — Encounter: Payer: Self-pay | Admitting: Family

## 2018-05-31 ENCOUNTER — Inpatient Hospital Stay: Payer: Medicare Other | Attending: Family | Admitting: Family

## 2018-05-31 ENCOUNTER — Other Ambulatory Visit: Payer: Self-pay

## 2018-05-31 VITALS — BP 120/87 | HR 74 | Temp 97.0°F | Resp 20 | Wt 248.0 lb

## 2018-05-31 DIAGNOSIS — R5383 Other fatigue: Secondary | ICD-10-CM | POA: Insufficient documentation

## 2018-05-31 DIAGNOSIS — Z79899 Other long term (current) drug therapy: Secondary | ICD-10-CM | POA: Diagnosis not present

## 2018-05-31 DIAGNOSIS — D45 Polycythemia vera: Secondary | ICD-10-CM | POA: Insufficient documentation

## 2018-05-31 DIAGNOSIS — E119 Type 2 diabetes mellitus without complications: Secondary | ICD-10-CM | POA: Insufficient documentation

## 2018-05-31 DIAGNOSIS — Z7982 Long term (current) use of aspirin: Secondary | ICD-10-CM | POA: Diagnosis not present

## 2018-05-31 LAB — CMP (CANCER CENTER ONLY)
ALT: 40 U/L (ref 10–47)
ANION GAP: 6 (ref 5–15)
AST: 35 U/L (ref 11–38)
Albumin: 4.1 g/dL (ref 3.5–5.0)
Alkaline Phosphatase: 96 U/L — ABNORMAL HIGH (ref 26–84)
BILIRUBIN TOTAL: 0.6 mg/dL (ref 0.2–1.6)
BUN: 19 mg/dL (ref 7–22)
CO2: 32 mmol/L (ref 18–33)
Calcium: 10 mg/dL (ref 8.0–10.3)
Chloride: 104 mmol/L (ref 98–108)
Creatinine: 1.4 mg/dL — ABNORMAL HIGH (ref 0.60–1.20)
GLUCOSE: 171 mg/dL — AB (ref 73–118)
POTASSIUM: 4.6 mmol/L (ref 3.3–4.7)
Sodium: 142 mmol/L (ref 128–145)
TOTAL PROTEIN: 7.6 g/dL (ref 6.4–8.1)

## 2018-05-31 LAB — CBC WITH DIFFERENTIAL (CANCER CENTER ONLY)
BASOS ABS: 0.2 10*3/uL — AB (ref 0.0–0.1)
Basophils Relative: 3 %
EOS PCT: 5 %
Eosinophils Absolute: 0.3 10*3/uL (ref 0.0–0.5)
HEMATOCRIT: 44 % (ref 38.7–49.9)
HEMOGLOBIN: 13.1 g/dL (ref 13.0–17.1)
LYMPHS ABS: 1.9 10*3/uL (ref 0.9–3.3)
LYMPHS PCT: 26 %
MCH: 20.3 pg — AB (ref 28.0–33.4)
MCHC: 29.8 g/dL — ABNORMAL LOW (ref 32.0–35.9)
MCV: 68.3 fL — AB (ref 82.0–98.0)
Monocytes Absolute: 0.7 10*3/uL (ref 0.1–0.9)
Monocytes Relative: 10 %
NEUTROS PCT: 56 %
Neutro Abs: 4 10*3/uL (ref 1.5–6.5)
Platelet Count: 211 10*3/uL (ref 145–400)
RBC: 6.44 MIL/uL — AB (ref 4.20–5.70)
RDW: 18.7 % — ABNORMAL HIGH (ref 11.1–15.7)
WBC: 7.1 10*3/uL (ref 4.0–10.0)

## 2018-05-31 MED ORDER — SODIUM CHLORIDE 0.9 % IV SOLN
INTRAVENOUS | Status: DC
  Administered 2018-05-31: 10:00:00 via INTRAVENOUS
  Filled 2018-05-31 (×2): qty 250

## 2018-05-31 NOTE — Progress Notes (Signed)
Hematology and Oncology Follow Up Visit  Chase Gutierrez [id number] 08/11/1951 67 y.o. 05/31/2018   Principle Diagnosis:  Polycythemia vera - JAK2 NEGATIVE  Current Therapy:   Phlebotomy to maintain hematocrit below 40% Aspirin 81 mg by mouth daily         Interim History: Chase Gutierrez is here today for follow-up and phlebotomy. He is symptomatic with fatigue, SOB with exertion and occasional dizziness.  Hct today is 44%.  He is taking his baby aspirin daily He denies, fever, chills, n/v, cough, rash, headaches, chest pain, palpitations, abdominal pain or changes in bowel or bladder habits.  No swelling or tenderness in his extremities. He feels that the numbness and tingling in his extremities has improved.  No lymphadenopathy noted on his exam.  No episodes of bleeding, no bruising or petechiae.  He has maintained a good appetite and is staying well hydrated. He is eating healthier and his weight is down 13 lbs. He is quite excited about this.   ECOG Performance Status: 1 - Symptomatic but completely ambulatory  Medications:  Allergies as of 05/31/2018      Reactions   Provigil [modafinil] Hives      Medication List        Accurate as of 05/31/18  9:15 AM. Always use your most recent med list.          ALPRAZolam 0.5 MG tablet Commonly known as:  XANAX TAKE ONE TABLET TWICE DAILY AS NEEDED FOR ANXIETY   amitriptyline 25 MG tablet Commonly known as:  ELAVIL Take 25 mg by mouth at bedtime.   aspirin 81 MG EC tablet Take 81 mg by mouth daily.   blood glucose meter kit and supplies Dispense based on patient and insurance preference. Use up to four times daily as directed. (FOR ICD-9 250.00, 250.01).   cephALEXin 500 MG capsule Commonly known as:  KEFLEX Take 500 mg by mouth 2 (two) times daily. For 10 days, started 05/08/18   colchicine 0.6 MG tablet Take 1 tablet (0.6 mg total) by mouth as needed.   [name] PO Take 2 tablets by mouth 2 (two) times daily.   furosemide 40 MG tablet Commonly known as:  LASIX TAKE ONE TABLET EVERY DAY   hydrochlorothiazide 12.5 MG tablet Commonly known as:  HYDRODIURIL Take 12.5 mg by mouth daily.   hydrOXYzine 10 MG tablet Commonly known as:  ATARAX/VISTARIL TAKE ONE TABLET THREE TIMES DAILY AS NEEDED FOR ITCHING   ibuprofen 400 MG tablet Commonly known as:  ADVIL,MOTRIN Take 400 mg by mouth as needed.   metoprolol tartrate 25 MG tablet Commonly known as:  LOPRESSOR Take 1 tablet (25 mg total) by mouth 2 (two) times daily.   multivitamin capsule Take 1 capsule by mouth daily.   naproxen 500 MG tablet Commonly known as:  NAPROSYN Take 250 mg by mouth as needed.   NON FORMULARY Take by mouth every morning. VITA-PULSE   ONE TOUCH ULTRA TEST test strip Generic drug:  glucose blood CHECK BLOOD GLUCOSE (SUGAR) UPTO 4 TIMESDAILY AS DIRECTED   ONETOUCH DELICA LANCETS FINE Misc CHECK BLOOD GLUCOSE (SUGAR) UPTO 4 TIMESDAILY AS DIRECTED   potassium chloride SA 20 MEQ tablet Commonly known as:  K-DUR,KLOR-CON Take 20 mEq by mouth once. Pt. States he takes when he uses Lasix   SYNTHROID 50 MCG tablet Generic drug:  levothyroxine 50 mcg daily.   triamcinolone 0.1 % paste Commonly known as:  KENALOG Use as directed 1 application in the mouth or throat 2 (  two) times daily.   TRULICITY 1.5 JI/9.6VE Sopn Generic drug:  Dulaglutide once a week.       Allergies:  Allergies  Allergen Reactions  . Provigil [Modafinil] Hives    Past Medical History, Surgical history, Social history, and Family History were reviewed and updated.  Review of Systems: All other 10 point review of systems is negative.   Physical Exam:  weight is 248 lb (112.5 kg). His oral temperature is 97 F (36.1 C) (abnormal). His blood pressure is 120/87 and his pulse is 74. His respiration is 20 and oxygen saturation is 96%.   Wt Readings from Last 3 Encounters:  05/31/18 248 lb (112.5 kg)  05/09/18 261 lb (118.4 kg)   05/05/18 263 lb (119.3 kg)    Ocular: Sclerae unicteric, pupils equal, round and reactive to light Ear-nose-throat: Oropharynx clear, dentition fair Lymphatic: No cervical, supraclavicular or axillary adenopathy Lungs no rales or rhonchi, good excursion bilaterally Heart regular rate and rhythm, no murmur appreciated Abd soft, nontender, positive bowel sounds, no liver or spleen tip palpated on exam, no fluid wave  MSK no focal spinal tenderness, no joint edema Neuro: non-focal, well-oriented, appropriate affect Breasts: Deferred   Lab Results  Component Value Date   WBC 7.1 05/31/2018   HGB 13.1 05/31/2018   HCT 44.0 05/31/2018   MCV 68.3 (L) 05/31/2018   PLT 211 05/31/2018   Lab Results  Component Value Date   FERRITIN <4 (L) 05/04/2018   IRON 25 (L) 05/04/2018   TIBC 441 (H) 05/04/2018   UIBC 416 05/04/2018   IRONPCTSAT 6 (L) 05/04/2018   Lab Results  Component Value Date   RETICCTPCT 1.9 (H) 05/04/2018   RBC 6.44 (H) 05/31/2018   RETICCTABS 93.6 08/08/2013   No results found for: KPAFRELGTCHN, LAMBDASER, KAPLAMBRATIO No results found for: IGGSERUM, IGA, IGMSERUM No results found for: Kathrynn Ducking, MSPIKE, SPEI   Chemistry      Component Value Date/Time   NA 142 05/31/2018 0838   NA 144 08/04/2017 1438   NA 143 01/14/2017 0846   K 4.6 05/31/2018 0838   K 4.3 08/04/2017 1438   K 3.6 01/14/2017 0846   CL 104 05/31/2018 0838   CL 101 08/04/2017 1438   CO2 32 05/31/2018 0838   CO2 32 08/04/2017 1438   CO2 31 (H) 01/14/2017 0846   BUN 19 05/31/2018 0838   BUN 17 08/04/2017 1438   BUN 22.2 01/14/2017 0846   CREATININE 1.40 (H) 05/31/2018 0838   CREATININE 1.4 (H) 08/04/2017 1438   CREATININE 1.5 (H) 01/14/2017 0846      Component Value Date/Time   CALCIUM 10.0 05/31/2018 0838   CALCIUM 9.3 08/04/2017 1438   CALCIUM 9.9 01/14/2017 0846   ALKPHOS 96 (H) 05/31/2018 0838   ALKPHOS 87 (H) 08/04/2017 1438    ALKPHOS 87 01/14/2017 0846   AST 35 05/31/2018 0838   AST 26 01/14/2017 0846   ALT 40 05/31/2018 0838   ALT 35 08/04/2017 1438   ALT 29 01/14/2017 0846   BILITOT 0.6 05/31/2018 0838   BILITOT 0.54 01/14/2017 0846      Impression and Plan: Chase Gutierrez is a very pleasant 67 yo caucasian gentleman with polycythemia, JAK-2 negative.He is symptomatic as mentioned above.  Hct today is 44% so we will proceed with phlebotomy and replacement fluids.  He will continue taking his baby aspirin daily.  We will recheck his lab work in 2 weeks before he goes back to [employer]  and do another phlebotomy if needed.  He will keep his follow-up appointment in December.  He will contact our office with any questions or concerns. We can certainly see him sooner if need be.   Laverna Peace, NP 10/2/20199:15 AM

## 2018-05-31 NOTE — Progress Notes (Signed)
Chase Gutierrez presents today for phlebotomy per MD orders. Phlebotomy procedure started at 0945 and ended at 1010. 516 cc removed via 20 G needle at R antecubital site followed by IV fluids replacement. Patient tolerated procedure well. IV needle removed intact.

## 2018-05-31 NOTE — Patient Instructions (Signed)

## 2018-06-05 ENCOUNTER — Encounter: Payer: Self-pay | Admitting: *Deleted

## 2018-06-05 NOTE — Telephone Encounter (Signed)
error 

## 2018-06-06 ENCOUNTER — Ambulatory Visit (INDEPENDENT_AMBULATORY_CARE_PROVIDER_SITE_OTHER): Payer: Medicare Other | Admitting: Neurology

## 2018-06-06 DIAGNOSIS — I359 Nonrheumatic aortic valve disorder, unspecified: Secondary | ICD-10-CM

## 2018-06-06 DIAGNOSIS — D500 Iron deficiency anemia secondary to blood loss (chronic): Secondary | ICD-10-CM

## 2018-06-06 DIAGNOSIS — G4733 Obstructive sleep apnea (adult) (pediatric): Secondary | ICD-10-CM | POA: Diagnosis not present

## 2018-06-06 DIAGNOSIS — R718 Other abnormality of red blood cells: Secondary | ICD-10-CM

## 2018-06-06 DIAGNOSIS — G4731 Primary central sleep apnea: Secondary | ICD-10-CM

## 2018-06-06 DIAGNOSIS — G4739 Other sleep apnea: Secondary | ICD-10-CM

## 2018-06-06 DIAGNOSIS — R002 Palpitations: Secondary | ICD-10-CM

## 2018-06-12 DIAGNOSIS — I359 Nonrheumatic aortic valve disorder, unspecified: Secondary | ICD-10-CM | POA: Insufficient documentation

## 2018-06-12 DIAGNOSIS — G4731 Primary central sleep apnea: Secondary | ICD-10-CM | POA: Insufficient documentation

## 2018-06-12 DIAGNOSIS — G4739 Other sleep apnea: Secondary | ICD-10-CM | POA: Insufficient documentation

## 2018-06-12 NOTE — Procedures (Signed)
[facility] [address] [place], [place] [address] NAME:   Chase Gutierrez                                                         DOB: 1951-05-22 MEDICAL RECORD No:  [id number]                                         DOS:  06/06/2018 REFERRING PHYSICIAN: Carmie Kanner, MD STUDY PERFORMED: Home Sleep Study on watch pat , repeated HST. HISTORY: [name] is a 67 y.o. male Patient seen in office on 05-09-2018.    I have the pleasure of seeing Mr. Edelman today on 09 May 2018, he has seen me last in December 2016 and we discussed how to obtain a travel friendly CPAP for him.  At the time his main CPAP was not old enough to be replaced.  The patient decided to purchase one without insurance coverage out right and he has used it compliantly ever since.  He is however for his primary machine entitled to a new device.  He lives most of the year now in [place]. He will need a DME that can supply him out of state. He has suffered from anemia in relation to his treatment for polycythemia vera- negative for [facility] 2 marker, has had multiple medical conditions, incl. CHF, abdominal aortic aneurysm, aortic stenosis with valve replacement in 2010, obesity, OSA on CPAP   Epworth Sleepiness score at 15/24, Fatigue Severity Severity score at 50/63; BMI: 36.4  STUDY RESULTS:  Total Recording Time: 8 hours 10 minutes, valid 6 h and 31 min.  Total Apnea/Hypopnea Index (AHI):  58.3 /h; RDI:  58.5 /h; pure Al at 39/h and Central apneas at 28.2/h Average Oxygen Saturation: 95 %; Lowest Oxygen Desaturation: 74 %  Total Time Oxygen Saturation below 89 %: 12.7 minutes  Average Heart Rate:   66 bpm (max. 90 bpm)  IMPRESSION: This is a Severe, Centrally dominated Complex Sleep Apnea Syndrome. Hypoxemia was not sustained, heart rate was between 45 and 90 bpm.    RECOMMENDATION: This patient will need PAP therapy, but I am hesitant to prescribe an  autotitration device given the complex nature. A previous HST on apnea link had also confirmed Complex sleep apnea at a similar level (severe). I will order a return for full night CPAP/ BiPAP titration. *If this cannot be done while in - will order limited auto 5-12 cm water with 3 cm Water EPR and heated humidity.  I certify that I have reviewed the raw data recording prior to the issuance of this report in accordance with the standards of the American Academy of Sleep Medicine (AASM). Larey Seat, M.D.     06-12-2018    Medical Director of [facility] at [facility], accredited by the AASM. Diplomat of the ABPN and ABSM.

## 2018-06-13 ENCOUNTER — Telehealth: Payer: Self-pay | Admitting: Neurology

## 2018-06-13 ENCOUNTER — Inpatient Hospital Stay: Payer: Medicare Other

## 2018-06-13 VITALS — BP 134/78 | HR 70 | Temp 98.5°F | Resp 20

## 2018-06-13 DIAGNOSIS — Z79899 Other long term (current) drug therapy: Secondary | ICD-10-CM | POA: Diagnosis not present

## 2018-06-13 DIAGNOSIS — D45 Polycythemia vera: Secondary | ICD-10-CM

## 2018-06-13 DIAGNOSIS — I359 Nonrheumatic aortic valve disorder, unspecified: Secondary | ICD-10-CM

## 2018-06-13 DIAGNOSIS — R002 Palpitations: Secondary | ICD-10-CM

## 2018-06-13 DIAGNOSIS — E119 Type 2 diabetes mellitus without complications: Secondary | ICD-10-CM | POA: Diagnosis not present

## 2018-06-13 DIAGNOSIS — G4739 Other sleep apnea: Secondary | ICD-10-CM

## 2018-06-13 DIAGNOSIS — G4731 Primary central sleep apnea: Principal | ICD-10-CM

## 2018-06-13 DIAGNOSIS — Z7982 Long term (current) use of aspirin: Secondary | ICD-10-CM | POA: Diagnosis not present

## 2018-06-13 DIAGNOSIS — R5383 Other fatigue: Secondary | ICD-10-CM | POA: Diagnosis not present

## 2018-06-13 LAB — CBC WITH DIFFERENTIAL (CANCER CENTER ONLY)
ABS IMMATURE GRANULOCYTES: 0.07 10*3/uL (ref 0.00–0.07)
Basophils Absolute: 0.3 10*3/uL — ABNORMAL HIGH (ref 0.0–0.1)
Basophils Relative: 3 %
Eosinophils Absolute: 0.4 10*3/uL (ref 0.0–0.5)
Eosinophils Relative: 5 %
HCT: 41.9 % (ref 39.0–52.0)
HEMOGLOBIN: 11.5 g/dL — AB (ref 13.0–17.0)
IMMATURE GRANULOCYTES: 1 %
LYMPHS PCT: 24 %
Lymphs Abs: 2.3 10*3/uL (ref 0.7–4.0)
MCH: 19.3 pg — AB (ref 26.0–34.0)
MCHC: 27.4 g/dL — ABNORMAL LOW (ref 30.0–36.0)
MCV: 70.2 fL — ABNORMAL LOW (ref 80.0–100.0)
MONO ABS: 0.9 10*3/uL (ref 0.1–1.0)
Monocytes Relative: 9 %
NEUTROS PCT: 58 %
Neutro Abs: 5.8 10*3/uL (ref 1.7–7.7)
Platelet Count: 306 10*3/uL (ref 150–400)
RBC: 5.97 MIL/uL — AB (ref 4.22–5.81)
RDW: 17 % — ABNORMAL HIGH (ref 11.5–15.5)
WBC: 9.7 10*3/uL (ref 4.0–10.5)
nRBC: 0 % (ref 0.0–0.2)

## 2018-06-13 MED ORDER — SODIUM CHLORIDE 0.9 % IV SOLN
INTRAVENOUS | Status: DC
  Administered 2018-06-13: 14:00:00 via INTRAVENOUS
  Filled 2018-06-13: qty 250

## 2018-06-13 NOTE — Telephone Encounter (Signed)
Called patient to discuss sleep study results. No answer at this time. LVM for the patient to call back.   

## 2018-06-13 NOTE — Patient Instructions (Signed)
Therapeutic Phlebotomy Therapeutic phlebotomy is the controlled removal of blood from a person's body for the purpose of treating a medical condition. The procedure is similar to donating blood. Usually, about a pint (470 mL, or 0.47L) of blood is removed. The average adult has 9-12 pints (4.3-5.7 L) of blood. Therapeutic phlebotomy may be used to treat the following medical conditions:  Hemochromatosis. This is a condition in which the blood contains too much iron.  Polycythemia vera. This is a condition in which the blood contains too many red blood cells.  Porphyria cutanea tarda. This is a disease in which an important part of hemoglobin is not made properly. It results in the buildup of abnormal amounts of porphyrins in the body.  Sickle cell disease. This is a condition in which the red blood cells form an abnormal crescent shape rather than a round shape.  Tell a health care provider about:  Any allergies you have.  All medicines you are taking, including vitamins, herbs, eye drops, creams, and over-the-counter medicines.  Any problems you or family members have had with anesthetic medicines.  Any blood disorders you have.  Any surgeries you have had.  Any medical conditions you have. What are the risks? Generally, this is a safe procedure. However, problems may occur, including:  Nausea or light-headedness.  Low blood pressure.  Soreness, bleeding, swelling, or bruising at the needle insertion site.  Infection.  What happens before the procedure?  Follow instructions from your health care provider about eating or drinking restrictions.  Ask your health care provider about changing or stopping your regular medicines. This is especially important if you are taking diabetes medicines or blood thinners.  Wear clothing with sleeves that can be raised above the elbow.  Plan to have someone take you home after the procedure.  You may have a blood sample taken. What  happens during the procedure?  A needle will be inserted into one of your veins.  Tubing and a collection bag will be attached to that needle.  Blood will flow through the needle and tubing into the collection bag.  You may be asked to open and close your hand slowly and continually during the entire collection.  After the specified amount of blood has been removed from your body, the collection bag and tubing will be clamped.  The needle will be removed from your vein.  Pressure will be held on the site of the needle insertion to stop the bleeding.  A bandage (dressing) will be placed over the needle insertion site. The procedure may vary among health care providers and hospitals. What happens after the procedure?  Your recovery will be assessed and monitored.  You can return to your normal activities as directed by your health care provider. This information is not intended to replace advice given to you by your health care provider. Make sure you discuss any questions you have with your health care provider. Document Released: 01/18/2011 Document Revised: 04/17/2016 Document Reviewed: 08/12/2014 Elsevier Interactive Patient Education  2018 Elsevier Inc.  

## 2018-06-13 NOTE — Telephone Encounter (Signed)
-----   Message from Larey Seat, MD sent at 06/12/2018  8:50 AM EDT ----- The HST result is again positive for over 50% of all apneas being central in origin- and this means an autotitration is of limited benefit. I will ask this patient to come in for full night PAP titration , to avoid central apneas dominating the CPAP results, ay need BiPAP or ASV.   IMPRESSION: This is a Severe, Centrally dominated Complex Sleep  Apnea Syndrome. Hypoxemia was not sustained, heart rate was between 45 and 90  bpm.   RECOMMENDATION: This patient will need PAP therapy, but I am  hesitant to prescribe an autotitration device given the complex  nature. A previous HST on apnea link had also confirmed Complex  sleep apnea at a similar level (severe). I will order a return  for full night CPAP/ BiPAP titration. #If this cannot be done  while in [place]- will order limited auto 5-12 cm water with 3 cm  Water EPR and heated humidity.

## 2018-06-14 ENCOUNTER — Other Ambulatory Visit: Payer: Medicare Other

## 2018-06-15 NOTE — Telephone Encounter (Signed)
I called pt. I advised pt that Dr. Brett Fairy reviewed their sleep study results and found that pt has severe centrally dominate apnea. Dr. Brett Fairy recommends that pt comes in for CPAP titration. Pt is unable to make it into the sleep lab until December for the test and he is already scheduled for a follow up in office in December. When advising the pt of the rules of medicare insurance and how he would have to follow up 31-90 days after starting the new CPAP machine the patient states that he would not be able to that. Dr. Brett Fairy did state that if the pt was unable to come in for cpap titration testing then she would order auto CPAP 5-12cm water pressure for the patient. The patient opted to do this and keep follow up in decemberI reviewed PAP compliance expectations with the pt. Pt is agreeable to starting a CPAP. I advised pt that an order will be sent to a DME, [facility], and [facility] will call the pt within about one week after they file with the pt's insurance. [facility] will show the pt how to use the machine, fit for masks, and troubleshoot the CPAP if needed. A follow up appt was made for insurance purposes with Dr. Brett Fairy on Dec 11,2019. Pt verbalized understanding to arrive 15 minutes early and bring their CPAP. A letter with all of this information in it will be mailed to the pt as a reminder. I verified with the pt that the address we have on file is correct. Pt verbalized understanding of results. Pt had no questions at this time but was encouraged to call back if questions arise. I have sent the order to [facility] and have received confirmation that they have received the order.

## 2018-06-15 NOTE — Addendum Note (Signed)
Addended by: [name] C on: 06/15/2018 11:06 AM   Modules accepted: Orders

## 2018-06-24 DIAGNOSIS — Z23 Encounter for immunization: Secondary | ICD-10-CM | POA: Diagnosis not present

## 2018-06-30 ENCOUNTER — Other Ambulatory Visit: Payer: Self-pay | Admitting: Internal Medicine

## 2018-08-07 ENCOUNTER — Ambulatory Visit (INDEPENDENT_AMBULATORY_CARE_PROVIDER_SITE_OTHER): Payer: Medicare Other | Admitting: Neurology

## 2018-08-07 DIAGNOSIS — R82998 Other abnormal findings in urine: Secondary | ICD-10-CM | POA: Diagnosis not present

## 2018-08-07 DIAGNOSIS — E7849 Other hyperlipidemia: Secondary | ICD-10-CM | POA: Diagnosis not present

## 2018-08-07 DIAGNOSIS — E1149 Type 2 diabetes mellitus with other diabetic neurological complication: Secondary | ICD-10-CM | POA: Diagnosis not present

## 2018-08-07 DIAGNOSIS — G4731 Primary central sleep apnea: Secondary | ICD-10-CM | POA: Diagnosis not present

## 2018-08-07 DIAGNOSIS — R002 Palpitations: Secondary | ICD-10-CM

## 2018-08-07 DIAGNOSIS — D45 Polycythemia vera: Secondary | ICD-10-CM

## 2018-08-07 DIAGNOSIS — E038 Other specified hypothyroidism: Secondary | ICD-10-CM | POA: Diagnosis not present

## 2018-08-07 DIAGNOSIS — I359 Nonrheumatic aortic valve disorder, unspecified: Secondary | ICD-10-CM

## 2018-08-07 DIAGNOSIS — D500 Iron deficiency anemia secondary to blood loss (chronic): Secondary | ICD-10-CM

## 2018-08-07 DIAGNOSIS — M109 Gout, unspecified: Secondary | ICD-10-CM | POA: Diagnosis not present

## 2018-08-07 DIAGNOSIS — Z125 Encounter for screening for malignant neoplasm of prostate: Secondary | ICD-10-CM | POA: Diagnosis not present

## 2018-08-07 DIAGNOSIS — G4739 Other sleep apnea: Secondary | ICD-10-CM

## 2018-08-07 DIAGNOSIS — E291 Testicular hypofunction: Secondary | ICD-10-CM | POA: Diagnosis not present

## 2018-08-08 NOTE — Addendum Note (Signed)
Addended by: Larey Seat on: 08/08/2018 08:58 AM   Modules accepted: Orders

## 2018-08-08 NOTE — Procedures (Signed)
PATIENT'S NAME:  Chase Gutierrez, Chase Gutierrez DOB:      [birth year]      MR#:    [id number]     DATE OF RECORDING: 08/07/2018 AL  REFERRING M.D.:  Carmie Kanner MD Study Performed:   CPAP  Titration HISTORY:  [name] returned for titration to positive airway pressure following complex sleep apnea identified on home sleep test from 06-06-2018 , AHI was severe at 58.3/h, oxygen nadir was 74% desaturation time was 12.7 minutes.  The patient endorsed the Epworth Sleepiness Scale at 15/24 points.   The patient's weight 260 pounds with a height of 71 (inches), resulting in a BMI of 36.4 kg/m2. The patient's neck circumference measured 19 inches.  CURRENT MEDICATIONS: Xanax, Elavil, Aspirin, Keflex, Lasix, Colchicine, Hydrodiuril, Vistaril, Advil, Lopressor, [name], Naprosyn, Kenalog, Trulicity   PROCEDURE:  This is a multichannel digital polysomnogram utilizing the SomnoStar 11.2 system.  Electrodes and sensors were applied and monitored per AASM Specifications.   EEG, EOG, Chin and Limb EMG, were sampled at 200 Hz.  ECG, Snore and Nasal Pressure, Thermal Airflow, Respiratory Effort, CPAP Flow and Pressure, Oximetry was sampled at 50 Hz. Digital video and audio were recorded.      CPAP was initiated at 5 cmH20 with heated humidity per AASM split night standards and pressure was advanced to 15 cmH20 because of hypopneas, apneas and desaturations. CPAP worked as long as the patient stayed in lateral, non-supine sleep position. During supine sleep, there were central apneas emerging.  BiPAP was initiated for supine- and did not fully address these. The technician changed to ASV next, Mask from home, optimal pressure was EPAP of 6 cm water, Max pressure support of 15 cm, minimum pressure support of 4 cm water.  At ASV pressure of 15/6/4 cmH20, there was a reduction of the AHI to 1.4/h with improvement of sleep apnea.  Lights Out was at 22:51 and Lights On at 04:59. Total recording time (TRT) was 368.5  minutes, with a total sleep time (TST) of 338.5 minutes. The patient's sleep latency was 10 minutes. REM latency was 58.5 minutes.  The sleep efficiency was 91.9 %.    SLEEP ARCHITECTURE: WASO (Wake after sleep onset) was 17 minutes.  There were 12.5 minutes in Stage N1, 53 minutes Stage N2, 163.5 minutes Stage N3 and 109.5 minutes in Stage REM.  The percentage of Stage N1 was 3.7%, Stage N2 was 15.7%, Stage N3 was 48.3% and Stage R (REM sleep) was 32.3%.  The arousals were noted as: 16 were spontaneous, 0 were associated with PLMs, and 12 were associated with respiratory events.  RESPIRATORY ANALYSIS:  There was a total of 60 respiratory events: 0 obstructive apneas, 60 central apneas and 0 hypopneas.     The total APNEA/HYPOPNEA INDEX (AHI) was 10.6 /hour.1 event occurred in REM sleep and 59 events in NREM. The REM AHI was 0 .5 /hour versus a non-REM AHI of 15.5 /hour.   The patient spent 161 minutes of total sleep time in the supine position and 178 minutes in non-supine. The supine AHI was 22.0, versus a non-supine AHI of 0.3.  OXYGEN SATURATION & C02:  The baseline 02 saturation was 96%, with the lowest being 82%. Time spent below 89% saturation equaled 38 minutes.  PERIODIC LIMB MOVEMENTS:  The arousals were noted as: 16 were spontaneous, 0 were associated with PLMs, and 12 were associated with respiratory events. The patient had a total of 0 Periodic Limb Movements.  Audio and video analysis did  not show any abnormal or unusual movements, behaviors, phonations or vocalizations.  No nocturia, EKG was in keeping with normal sinus rhythm (NSR). Post-study, the patient indicated that sleep was the same as usual.  The patient used his own ResMed AirTouch FF mask.  DIAGNOSIS 1. Complex-Central Sleep Apnea responded finally to ASV therapy, after CPAP and BiPAP failed to control central apnea in supine sleep.     PLANS/RECOMMENDATIONS: 1. The patient should avoid sedatives, hypnotics, and  alcoholic beverage consumption before bedtime 2. CPAP therapy compliance is defined as 4 hours or more of nightly use.    DISCUSSION: Schedule RV with ASV machine for compliance and review of therapeutic data.   A follow up appointment will be scheduled in the Sleep Clinic at [facility].   Please call [phone] with any questions.      I certify that I have reviewed the entire raw data recording prior to the issuance of this report in accordance with the Standards of Accreditation of the American Academy of Sleep Medicine (AASM)    Larey Seat, M.D.    08-07-2018  Diplomat, American Board of Psychiatry and Neurology  Diplomat, [place] [employer] Sleep [facility] Medical Director, [place] [facility] [employer]

## 2018-08-09 ENCOUNTER — Encounter: Payer: Self-pay | Admitting: Neurology

## 2018-08-09 ENCOUNTER — Ambulatory Visit (INDEPENDENT_AMBULATORY_CARE_PROVIDER_SITE_OTHER): Payer: Medicare Other | Admitting: Neurology

## 2018-08-09 VITALS — BP 138/87 | HR 73 | Ht 71.0 in | Wt 255.0 lb

## 2018-08-09 DIAGNOSIS — D751 Secondary polycythemia: Secondary | ICD-10-CM | POA: Diagnosis not present

## 2018-08-09 DIAGNOSIS — I350 Nonrheumatic aortic (valve) stenosis: Secondary | ICD-10-CM | POA: Diagnosis not present

## 2018-08-09 DIAGNOSIS — G4731 Primary central sleep apnea: Secondary | ICD-10-CM | POA: Diagnosis not present

## 2018-08-09 DIAGNOSIS — G4739 Other sleep apnea: Secondary | ICD-10-CM

## 2018-08-09 DIAGNOSIS — I359 Nonrheumatic aortic valve disorder, unspecified: Secondary | ICD-10-CM

## 2018-08-09 DIAGNOSIS — D45 Polycythemia vera: Secondary | ICD-10-CM

## 2018-08-09 DIAGNOSIS — N200 Calculus of kidney: Secondary | ICD-10-CM
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
## 2018-08-10 ENCOUNTER — Inpatient Hospital Stay: Payer: Medicare Other

## 2018-08-10 ENCOUNTER — Telehealth: Payer: Self-pay | Admitting: Family

## 2018-08-10 ENCOUNTER — Other Ambulatory Visit: Payer: Self-pay

## 2018-08-10 ENCOUNTER — Inpatient Hospital Stay: Payer: Medicare Other | Attending: Family | Admitting: Family

## 2018-08-10 ENCOUNTER — Encounter: Payer: Self-pay | Admitting: Family

## 2018-08-10 VITALS — BP 119/80 | HR 66 | Resp 20

## 2018-08-10 VITALS — BP 124/81 | HR 73 | Temp 98.3°F | Resp 18 | Wt 257.0 lb

## 2018-08-10 DIAGNOSIS — D45 Polycythemia vera: Secondary | ICD-10-CM

## 2018-08-10 DIAGNOSIS — D751 Secondary polycythemia: Secondary | ICD-10-CM | POA: Insufficient documentation

## 2018-08-10 DIAGNOSIS — G4730 Sleep apnea, unspecified: Secondary | ICD-10-CM | POA: Diagnosis not present

## 2018-08-10 DIAGNOSIS — Z7982 Long term (current) use of aspirin: Secondary | ICD-10-CM | POA: Insufficient documentation

## 2018-08-10 DIAGNOSIS — D500 Iron deficiency anemia secondary to blood loss (chronic): Secondary | ICD-10-CM

## 2018-08-10 LAB — CMP (CANCER CENTER ONLY)
ALT: 41 U/L (ref 0–44)
ANION GAP: 8 (ref 5–15)
AST: 31 U/L (ref 15–41)
Albumin: 4.7 g/dL (ref 3.5–5.0)
Alkaline Phosphatase: 76 U/L (ref 38–126)
BUN: 22 mg/dL (ref 8–23)
CO2: 33 mmol/L — ABNORMAL HIGH (ref 22–32)
Calcium: 10 mg/dL (ref 8.9–10.3)
Chloride: 102 mmol/L (ref 98–111)
Creatinine: 1.37 mg/dL — ABNORMAL HIGH (ref 0.61–1.24)
GFR, Est AFR Am: >60 mL/min (ref >60)
GFR, Estimated: 54 mL/min — ABNORMAL LOW (ref >60)
Glucose, Bld: 159 mg/dL — ABNORMAL HIGH (ref 70–99)
Potassium: 4.8 mmol/L (ref 3.5–5.1)
SODIUM: 143 mmol/L (ref 135–145)
Total Bilirubin: 0.4 mg/dL (ref 0.3–1.2)
Total Protein: 7.4 g/dL (ref 6.5–8.1)

## 2018-08-10 LAB — CBC WITH DIFFERENTIAL (CANCER CENTER ONLY)
Abs Immature Granulocytes: 0.06 10*3/uL (ref 0.00–0.07)
Basophils Absolute: 0.3 10*3/uL — ABNORMAL HIGH (ref 0.0–0.1)
Basophils Relative: 3 %
Eosinophils Absolute: 0.3 10*3/uL (ref 0.0–0.5)
Eosinophils Relative: 4 %
HCT: 44.8 % (ref 39.0–52.0)
HEMOGLOBIN: 11.9 g/dL — AB (ref 13.0–17.0)
Immature Granulocytes: 1 %
LYMPHS ABS: 1.7 10*3/uL (ref 0.7–4.0)
Lymphocytes Relative: 19 %
MCH: 18.6 pg — ABNORMAL LOW (ref 26.0–34.0)
MCHC: 26.6 g/dL — ABNORMAL LOW (ref 30.0–36.0)
MCV: 70.1 fL — AB (ref 80.0–100.0)
Monocytes Absolute: 0.7 10*3/uL (ref 0.1–1.0)
Monocytes Relative: 8 %
Neutro Abs: 5.9 10*3/uL (ref 1.7–7.7)
Neutrophils Relative %: 65 %
Platelet Count: 252 10*3/uL (ref 150–400)
RBC: 6.39 MIL/uL — ABNORMAL HIGH (ref 4.22–5.81)
RDW: 19.4 % — AB (ref 11.5–15.5)
WBC Count: 9 10*3/uL (ref 4.0–10.5)
nRBC: 0 % (ref 0.0–0.2)

## 2018-08-10 LAB — RETICULOCYTES
Immature Retic Fract: 31.6 % — ABNORMAL HIGH (ref 2.3–15.9)
RBC.: 6.39 MIL/uL — ABNORMAL HIGH (ref 4.22–5.81)
Retic Count, Absolute: 114.4 10*3/uL (ref 19.0–186.0)
Retic Ct Pct: 1.8 % (ref 0.4–3.1)

## 2018-08-10 MED ORDER — SODIUM CHLORIDE 0.9 % IV SOLN
INTRAVENOUS | Status: DC
  Administered 2018-08-10: 12:00:00 via INTRAVENOUS
  Filled 2018-08-10 (×2): qty 250

## 2018-08-10 NOTE — Patient Instructions (Signed)

## 2018-08-10 NOTE — Progress Notes (Signed)
Hematology and Oncology Follow Up Visit  Chase Gutierrez [id number] 25-Jan-1951 67 y.o. 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 Hct today is 44.8%.  He had another sleep study done to re-evaluate his sleep apnea. and is waiting to get fitted for his new BiPAP.  He has had joint aches and pains as well as some SOB with over exertion. He has also noticed some headaches and blurred vision on occasion.  He sees his cardiologist next week on Monday.  No fever, chills, n/v, cough, rash, dizziness, chest pain, palpitations, abdominal pain or changes in bowel or bladder habits.  Swelling in his lower extremities is stable on lasix.  Since starting Elavil to help him sleep as needed he has noticed that the neuropathy in his feet is improved.  No lymphadenopathy noted on exam.  He has maintained a good appetite and is staying well hydrated. His weight is stable.   ECOG Performance Status: 1 - Symptomatic but completely ambulatory  Medications:  Allergies as of 08/10/2018      Reactions   Provigil [modafinil] Hives      Medication List       Accurate as of August 10, 2018 10:31 AM. Always use your most recent med list.        ALPRAZolam 0.5 MG tablet Commonly known as:  XANAX TAKE ONE TABLET TWICE DAILY AS NEEDED FOR ANXIETY   amitriptyline 25 MG tablet Commonly known as:  ELAVIL Take 25 mg by mouth at bedtime.   aspirin 81 MG EC tablet Take 81 mg by mouth daily.   blood glucose meter kit and supplies Dispense based on patient and insurance preference. Use up to four times daily as directed. (FOR ICD-9 250.00, 250.01).   cephALEXin 500 MG capsule Commonly known as:  KEFLEX Take 500 mg by mouth 2 (two) times daily. For 10 days, started 05/08/18     colchicine 0.6 MG tablet Commonly known as:  COLCRYS Take 1 tablet (0.6 mg total) by mouth as needed.   [name] PO Take 2 tablets by mouth 2 (two) times daily.   furosemide 40 MG tablet Commonly known as:  LASIX TAKE ONE TABLET EVERY DAY   hydrochlorothiazide 12.5 MG tablet Commonly known as:  HYDRODIURIL Take 12.5 mg by mouth daily.   hydrOXYzine 10 MG tablet Commonly known as:  ATARAX/VISTARIL TAKE ONE TABLET THREE TIMES DAILY AS NEEDED FOR ITCHING   ibuprofen 400 MG tablet Commonly known as:  ADVIL,MOTRIN Take 400 mg by mouth as needed.   metoprolol tartrate 25 MG tablet Commonly known as:  LOPRESSOR TAKE ONE TABLET BY MOUTH TWICE A DAY   multivitamin capsule Take 1 capsule by mouth daily.   naproxen 500 MG tablet Commonly known as:  NAPROSYN Take 250 mg by mouth as needed.   ONE TOUCH ULTRA TEST test strip Generic drug:  glucose blood CHECK BLOOD GLUCOSE (SUGAR) UPTO 4 TIMESDAILY AS DIRECTED   ONETOUCH DELICA LANCETS FINE Misc CHECK BLOOD GLUCOSE (SUGAR) UPTO 4 TIMESDAILY AS DIRECTED   potassium chloride SA 20 MEQ tablet Commonly known as:  K-DUR,KLOR-CON Take 20 mEq by mouth once. Pt. States he takes when he uses Lasix   SYNTHROID 50 MCG tablet Generic drug:  levothyroxine 50 mcg daily.   triamcinolone 0.1 % paste Commonly known as:  KENALOG Use as directed 1 application in the mouth or throat 2 (two) times daily.   TRULICITY 1.5 RF/7.5OI Sopn Generic drug:  Dulaglutide once a week.       Allergies:  Allergies  Allergen Reactions  . Provigil [Modafinil] Hives    Past Medical History, Surgical history, Social history, and Family History were reviewed and updated.  Review of Systems: All other 10 point review of systems is negative.   Physical Exam:  vitals were not taken for this visit.   Wt Readings from Last 3 Encounters:  08/09/18 255 lb (115.7 kg)  05/31/18 248 lb (112.5 kg)  05/09/18 261 lb (118.4 kg)    Ocular: Sclerae  unicteric, pupils equal, round and reactive to light Ear-nose-throat: Oropharynx clear, dentition fair Lymphatic: No cervical, supraclavicular or axillary adenopathy Lungs no rales or rhonchi, good excursion bilaterally Heart regular rate and rhythm, no murmur appreciated Abd soft, nontender, positive bowel sounds, no liver or spleen tip palpated on exam, no fluid wave  MSK no focal spinal tenderness, no joint edema Neuro: non-focal, well-oriented, appropriate affect Breasts: Deferred   Lab Results  Component Value Date   WBC 9.7 06/13/2018   HGB 11.5 (L) 06/13/2018   HCT 41.9 06/13/2018   MCV 70.2 (L) 06/13/2018   PLT 306 06/13/2018   Lab Results  Component Value Date   FERRITIN <4 (L) 05/04/2018   IRON 25 (L) 05/04/2018   TIBC 441 (H) 05/04/2018   UIBC 416 05/04/2018   IRONPCTSAT 6 (L) 05/04/2018   Lab Results  Component Value Date   RETICCTPCT 1.9 (H) 05/04/2018   RBC 5.97 (H) 06/13/2018   RETICCTABS 93.6 08/08/2013   No results found for: KPAFRELGTCHN, LAMBDASER, KAPLAMBRATIO No results found for: IGGSERUM, IGA, IGMSERUM No results found for: Kathrynn Ducking, MSPIKE, SPEI   Chemistry      Component Value Date/Time   NA 142 05/31/2018 0838   NA 144 08/04/2017 1438   NA 143 01/14/2017 0846   K 4.6 05/31/2018 0838   K 4.3 08/04/2017 1438   K 3.6 01/14/2017 0846   CL 104 05/31/2018 0838   CL 101 08/04/2017 1438   CO2 32 05/31/2018 0838   CO2 32 08/04/2017 1438   CO2 31 (H) 01/14/2017 0846   BUN 19 05/31/2018 0838   BUN 17 08/04/2017 1438   BUN 22.2 01/14/2017 0846   CREATININE 1.40 (H) 05/31/2018 0838   CREATININE 1.4 (H) 08/04/2017 1438   CREATININE 1.5 (H) 01/14/2017 0846      Component Value Date/Time   CALCIUM 10.0 05/31/2018 0838   CALCIUM 9.3 08/04/2017 1438   CALCIUM 9.9 01/14/2017 0846   ALKPHOS 96 (H) 05/31/2018 0838   ALKPHOS 87 (H) 08/04/2017 1438   ALKPHOS 87 01/14/2017 0846   AST 35 05/31/2018 0838     AST 26 01/14/2017 0846   ALT 40 05/31/2018 0838   ALT 35 08/04/2017 1438   ALT 29 01/14/2017 0846   BILITOT 0.6 05/31/2018 0838   BILITOT 0.54 01/14/2017 0846       Impression and Plan: Chase Gutierrez is a very pleasant 67 yo caucasian gentleman with secondary polycythemia due to sleep apnea. He is symptomatic as mentioned above.  Hct is 44.8% so we will proceed with phlebotomy today followed by replacement fluids.  We will plan to see him back in another 2 months for follow-up and lab once he returns from [place].  He will contact our office with any questions or concerns.  We can certainly see him sooner if need be.   Laverna Peace, NP 12/12/201910:31 AM

## 2018-08-10 NOTE — Telephone Encounter (Signed)
Appointments scheduled letter/calendar mailed per 12/11 los

## 2018-08-10 NOTE — Progress Notes (Signed)
[name] presents today for phlebotomy per MD orders. Phlebotomy procedure started at 1155 and ended at 1220. 525 grams removed via 20 ga angio cath to left ac.  500 ml NS given over 30 minutes per NP order.  Patient observed for 30 minutes after procedure during IV fluids without any incident. Patient tolerated procedure well. IV needle removed intact.   OK to run IVF's at 999 ml/hr for 30 minutes per S. [place] NP.

## 2018-08-11 LAB — IRON AND TIBC
Iron: 28 ug/dL — ABNORMAL LOW (ref 42–163)
Saturation Ratios: 6 % — ABNORMAL LOW (ref 20–55)
TIBC: 466 ug/dL — ABNORMAL HIGH (ref 202–409)
UIBC: 438 ug/dL — ABNORMAL HIGH (ref 117–376)

## 2018-08-11 LAB — FERRITIN: Ferritin: 5 ng/mL — ABNORMAL LOW (ref 24–336)

## 2018-08-14 ENCOUNTER — Encounter: Payer: Self-pay | Admitting: Internal Medicine

## 2018-08-14 ENCOUNTER — Ambulatory Visit (INDEPENDENT_AMBULATORY_CARE_PROVIDER_SITE_OTHER): Payer: Medicare Other | Admitting: Internal Medicine

## 2018-08-14 VITALS — BP 122/82 | HR 90 | Ht 71.0 in | Wt 256.6 lb

## 2018-08-14 DIAGNOSIS — I119 Hypertensive heart disease without heart failure: Secondary | ICD-10-CM | POA: Diagnosis not present

## 2018-08-14 DIAGNOSIS — I509 Heart failure, unspecified: Secondary | ICD-10-CM | POA: Diagnosis not present

## 2018-08-14 DIAGNOSIS — Z0000 Encounter for general adult medical examination without abnormal findings: Secondary | ICD-10-CM | POA: Diagnosis not present

## 2018-08-14 DIAGNOSIS — I10 Essential (primary) hypertension: Secondary | ICD-10-CM | POA: Diagnosis not present

## 2018-08-14 DIAGNOSIS — N183 Chronic kidney disease, stage 3 (moderate): Secondary | ICD-10-CM | POA: Diagnosis not present

## 2018-08-14 DIAGNOSIS — G4733 Obstructive sleep apnea (adult) (pediatric): Secondary | ICD-10-CM | POA: Diagnosis not present

## 2018-08-14 DIAGNOSIS — E782 Mixed hyperlipidemia: Secondary | ICD-10-CM | POA: Diagnosis not present

## 2018-08-14 DIAGNOSIS — E1129 Type 2 diabetes mellitus with other diabetic kidney complication: Secondary | ICD-10-CM | POA: Diagnosis not present

## 2018-08-14 DIAGNOSIS — R0609 Other forms of dyspnea: Secondary | ICD-10-CM

## 2018-08-14 DIAGNOSIS — E038 Other specified hypothyroidism: Secondary | ICD-10-CM | POA: Diagnosis not present

## 2018-08-14 DIAGNOSIS — E7849 Other hyperlipidemia: Secondary | ICD-10-CM | POA: Diagnosis not present

## 2018-08-14 DIAGNOSIS — E291 Testicular hypofunction: Secondary | ICD-10-CM | POA: Diagnosis not present

## 2018-08-14 DIAGNOSIS — E1149 Type 2 diabetes mellitus with other diabetic neurological complication: Secondary | ICD-10-CM | POA: Diagnosis not present

## 2018-08-14 DIAGNOSIS — G4737 Central sleep apnea in conditions classified elsewhere: Secondary | ICD-10-CM | POA: Diagnosis not present

## 2018-08-14 DIAGNOSIS — I359 Nonrheumatic aortic valve disorder, unspecified: Secondary | ICD-10-CM

## 2018-08-14 DIAGNOSIS — Z6836 Body mass index (BMI) 36.0-36.9, adult: Secondary | ICD-10-CM | POA: Diagnosis not present

## 2018-08-14 DIAGNOSIS — I5032 Chronic diastolic (congestive) heart failure: Secondary | ICD-10-CM | POA: Diagnosis not present

## 2018-08-14 DIAGNOSIS — Z1389 Encounter for screening for other disorder: Secondary | ICD-10-CM | POA: Diagnosis not present

## 2018-08-14 NOTE — Progress Notes (Signed)
Cardiology Office Note   Date:  08/14/2018   ID:  Chase Gutierrez, DOB 10-Aug-1951, MRN [id number]  PCP:  Chase Redwood, MD  Cardiologist:   Chase Carnes, MD   F/U of HTN and AV dz     History of Present Illness: Chase Gutierrez is a 67 y.o. male with a history of AS  He is s/p AVR in 2010.  Also a history of HTN, thoracic aortic aneurysm, distolic CHF, PCV, sleep apnea.   I saw the pt in September  2019  At the time he felt achy   Not using CPAP  The pt returns for f/u   He says he was recent[place]ly seen [place]   Told that CPAP snot working, that he needs BiPAP  He complains of more SOB with activity   Hasnt done much  He denies CP    Wt is up a few pounds   Denies edema Just seen in Heme clinic    Underwent phlebotomy    Outpatient Medications Prior to Visit  Medication Sig Dispense Refill  . ALPRAZolam (XANAX) 0.5 MG tablet TAKE ONE TABLET TWICE DAILY AS NEEDED FOR ANXIETY 60 tablet 0  . amitriptyline (ELAVIL) 25 MG tablet Take 25 mg by mouth at bedtime.     Marland Kitchen aspirin 81 MG EC tablet Take 81 mg by mouth daily.    . blood glucose meter kit and supplies Dispense based on patient and insurance preference. Use up to four times daily as directed. (FOR ICD-9 250.00, 250.01). 1 each 0  . colchicine (COLCRYS) 0.6 MG tablet Take 1 tablet (0.6 mg total) by mouth as needed. 20 tablet 0  . furosemide (LASIX) 40 MG tablet Take 40 mg by mouth daily as needed.    . hydrochlorothiazide (HYDRODIURIL) 12.5 MG tablet Take 12.5 mg by mouth daily.      . hydrOXYzine (ATARAX/VISTARIL) 10 MG tablet TAKE ONE TABLET THREE TIMES DAILY AS NEEDED FOR ITCHING 90 tablet 2  . ibuprofen (ADVIL,MOTRIN) 400 MG tablet Take 400 mg by mouth as needed.      . metoprolol tartrate (LOPRESSOR) 25 MG tablet TAKE ONE TABLET BY MOUTH TWICE A DAY 180 tablet 3  . Multiple Vitamin (MULTIVITAMIN) capsule Take 1 capsule by mouth daily.      . Naltrexone-Bupropion HCl ([name] PO) Take 2 tablets by mouth 2 (two) times  daily.     . naproxen (NAPROSYN) 500 MG tablet Take 250 mg by mouth as needed.     . ONE TOUCH ULTRA TEST test strip CHECK BLOOD GLUCOSE (SUGAR) UPTO 4 TIMESDAILY AS DIRECTED 100 each 3  . ONETOUCH DELICA LANCETS FINE MISC CHECK BLOOD GLUCOSE (SUGAR) UPTO 4 TIMESDAILY AS DIRECTED 100 each 6  . potassium chloride SA (K-DUR,KLOR-CON) 20 MEQ tablet Take 20 mEq by mouth once. Pt. States he takes when he uses Lasix    . SYNTHROID 50 MCG tablet 50 mcg daily.    Marland Kitchen triamcinolone (KENALOG) 0.1 % paste Use as directed 1 application in the mouth or throat 2 (two) times daily. 5 g 6  . TRULICITY 1.5 NT/7.0YF SOPN once a week.     . cephALEXin (KEFLEX) 500 MG capsule Take 500 mg by mouth 2 (two) times daily. For 10 days, started 05/08/18    . furosemide (LASIX) 40 MG tablet TAKE ONE TABLET EVERY DAY (Patient not taking: No sig reported) 30 tablet 6   No facility-administered medications prior to visit.      Allergies:  Provigil [modafinil]   Past Medical History:  Diagnosis Date  . Adenomatous colon polyp    2005  . Allergic rhinitis   . Aortic stenosis   . Depressed   . Dyslipidemia   . Hypertension   . Nephrolithiasis   . Obesity   . OSA (obstructive sleep apnea)   . Polycythemia vera(238.4) 06/16/2012  . Sinus complaint     Past Surgical History:  Procedure Laterality Date  . AORTIC VALVE REPLACEMENT  10/15/2008  . [place]  . TONSILLECTOMY  1982, 1993     Social History:  The patient  reports that he has never smoked. He has never used smokeless tobacco. He reports current alcohol use.   Family History:  The patient's family history includes Coronary artery disease in his father and mother; Diabetes in his father; Lung cancer in his father.    ROS:  Please see the history of present illness. All other systems are reviewed and  Negative to the above problem except as noted.    PHYSICAL EXAM: VS:  BP 122/82   Pulse 90   Ht 5' 11" (1.803 m)   Wt 256 lb 9.6 oz  (116.4 kg)   SpO2 97%   BMI 35.79 kg/m   GEN: Morbidly obese 67 yo , in no acute distress  HEENT: normal  Neck: No JVD   No, carotid bruits, or masses Cardiac: RRR; Gr II/VI systolic murmur at base  No S3   No  LE edema    Respiratory:  clear to auscultation bilaterally, normal work of breathing GI: soft, nontender, nondistended, + BS  Obese   MS: no deformity Moving all extremities   Skin: warm and dry, no rash Neuro:  Strength and sensation are intact Psych: euthymic mood, full affect   EKG:  EKG is not  ordered today.    Lipid Panel    Component Value Date/Time   CHOL 149 11/21/2017 0751   TRIG 252 (H) 11/21/2017 0751   HDL 27 (L) 11/21/2017 0751   CHOLHDL 5.5 (H) 11/21/2017 0751   CHOLHDL 5.7 01/08/2015 1210   VLDL 35 01/08/2015 1210   LDLCALC 72 11/21/2017 0751   LDLDIRECT 101.0 07/01/2014 1102      Wt Readings from Last 3 Encounters:  08/14/18 256 lb 9.6 oz (116.4 kg)  08/10/18 257 lb (116.6 kg)  08/09/18 255 lb (115.7 kg)      ASSESSMENT AND PLAN:  1  AV dz  S/p .  AV replacement  In 2010 with tissue valve   Echo done last march Moderate LVH   LV function vigorous   AV prosthesis functioning OK    Mild diastolic dysfunciton    2  Hx diastolic CHF  Echo as noted   Pt may have a little extra fluid on exam   Watch wt   Can treat with lasix  For wt REcomm he slowly increase aerobic activity   3.  Aortic aneurysm. CT shows aorta is stable   Continue to follow     4.  HTN  BP is good  5  Lipids   Lipids in March 2019 LDL 72  HDL 27     6   PCV  Followed by Pearletha Alfred   S/p phlebotomy    7   OSA  Follow in neuro   Being switched to biPAP which may help with dyspnea     8  Morbid obesity   Encouraged diet and increased activity  Signed, Chase Carnes, MD  08/14/2018 9:43 AM    [address], [place],   [address] Phone: [phone]; Fax: [phone]

## 2018-08-14 NOTE — Patient Instructions (Addendum)
Medication Instructions:  No change If you need a refill on your cardiac medications before your next appointment, please call your pharmacy.   Lab work: none If you have labs (blood work) drawn today and your tests are completely normal, you will receive your results only by: Marland Kitchen MyChart Message (if you have MyChart) OR . A paper copy in the mail If you have any lab test that is abnormal or we need to change your treatment, we will call you to review the results.  Testing/Procedures: none  Follow-Up: At [facility], you and your health needs are our priority.  As part of our continuing mission to provide you with exceptional heart care, we have created designated Provider Care Teams.  These Care Teams include your primary Cardiologist (physician) and Advanced Practice Providers (APPs -  Physician Assistants and Nurse Practitioners) who all work together to provide you with the care you need, when you need it. Follow up as planned.   Any Other Special Instructions Will Be Listed Below (If Applicable). none

## 2018-08-15 DIAGNOSIS — R5383 Other fatigue: Secondary | ICD-10-CM | POA: Diagnosis not present

## 2018-08-15 DIAGNOSIS — Z1212 Encounter for screening for malignant neoplasm of rectum: Secondary | ICD-10-CM | POA: Diagnosis not present

## 2018-08-15 DIAGNOSIS — I10 Essential (primary) hypertension: Secondary | ICD-10-CM | POA: Diagnosis not present

## 2018-08-15 DIAGNOSIS — Z23 Encounter for immunization: Secondary | ICD-10-CM | POA: Diagnosis not present

## 2018-09-12 ENCOUNTER — Telehealth: Payer: Self-pay | Admitting: Neurology

## 2018-09-13 NOTE — Telephone Encounter (Signed)
error 

## 2018-10-11 ENCOUNTER — Other Ambulatory Visit: Payer: Medicare Other

## 2018-10-11 ENCOUNTER — Ambulatory Visit: Payer: Medicare Other | Admitting: Family

## 2018-11-02 IMAGING — CT CT ANGIO CHEST
2 of 7 series · 17 of 46 positions shown · IV contrast (isovue)
Comparison: 06/18/2015 and 06/17/2014

CLINICAL DATA: Follow-up thoracic aortic aneurysm.

EXAM:
CT ANGIOGRAPHY CHEST WITH CONTRAST
TECHNIQUE: Multidetector CT imaging of the chest was performed using the
standard protocol during bolus administration of intravenous
contrast. Multiplanar CT image reconstructions and MIPs were
obtained to evaluate the vascular anatomy.
CONTRAST:  100 mL Isovue 370

[Series 4: aorta 3.0 i31f 2 · axial · 0.86mm/px · z∈[-369,-48]mm · 14 of 121 slices shown]
[im 9/121  lung]
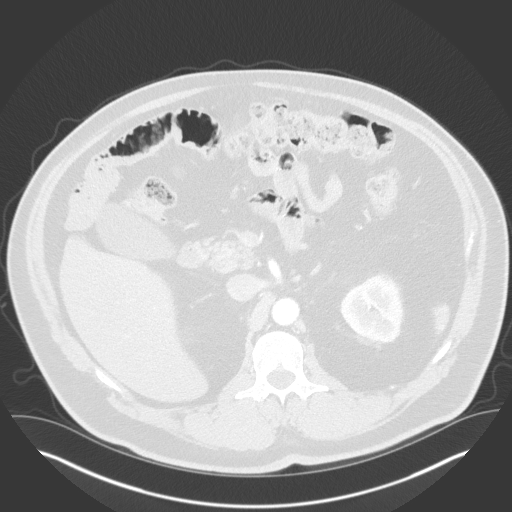
[im 17/121  soft-tissue]
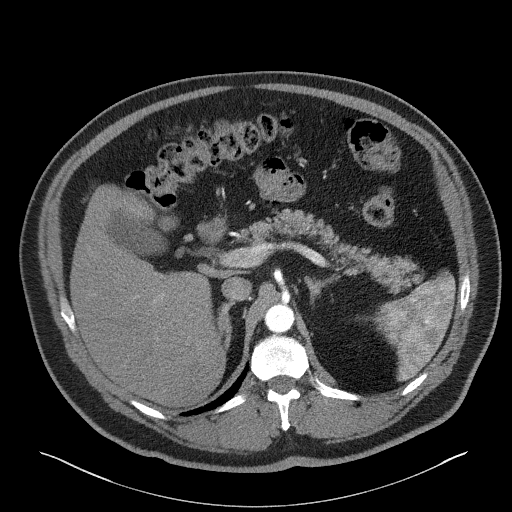
[im 25/121  lung]
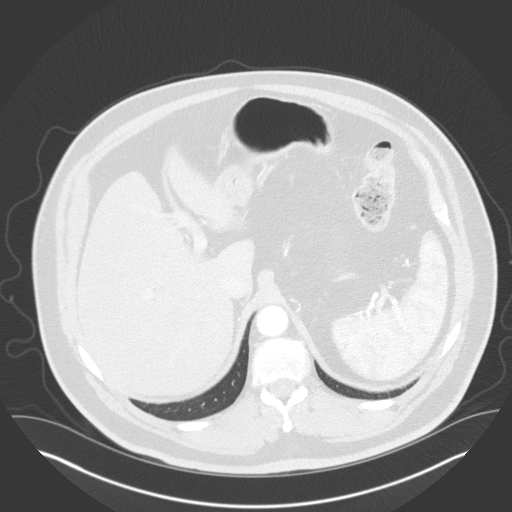
[im 34/121  soft-tissue]
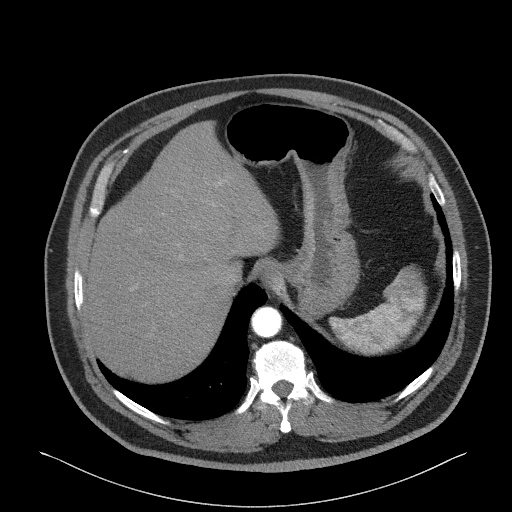
[im 42/121  lung]
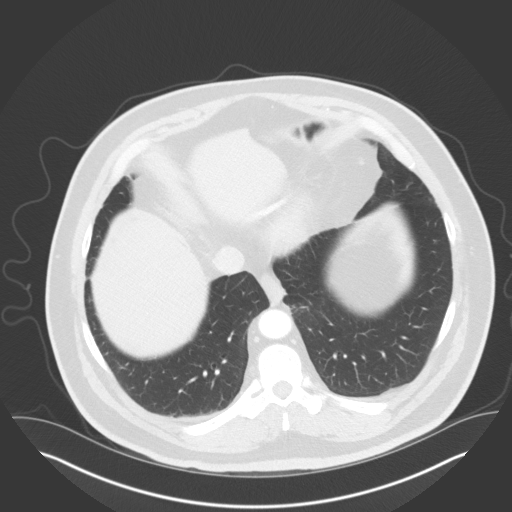
[im 50/121  soft-tissue]
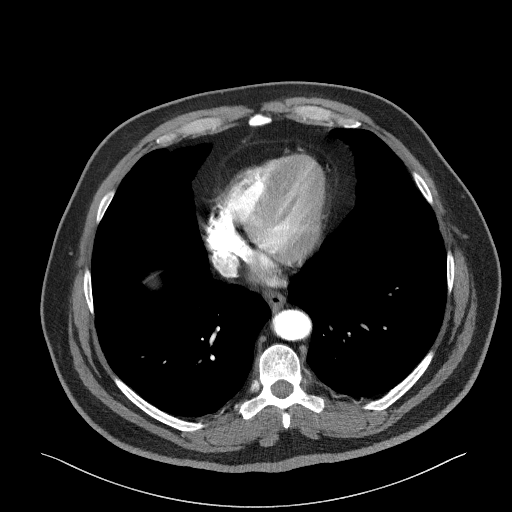
[im 58/121  lung]
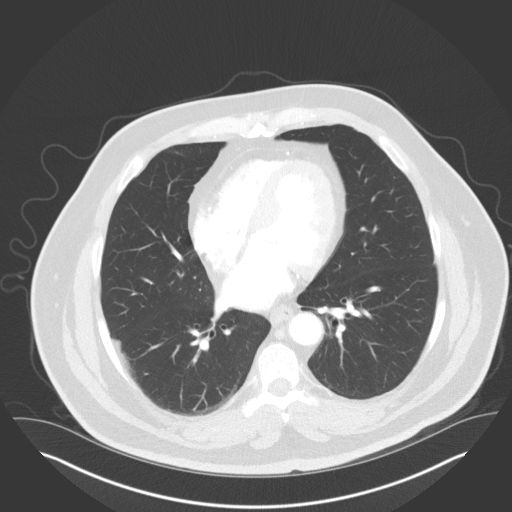
[im 67/121  soft-tissue]
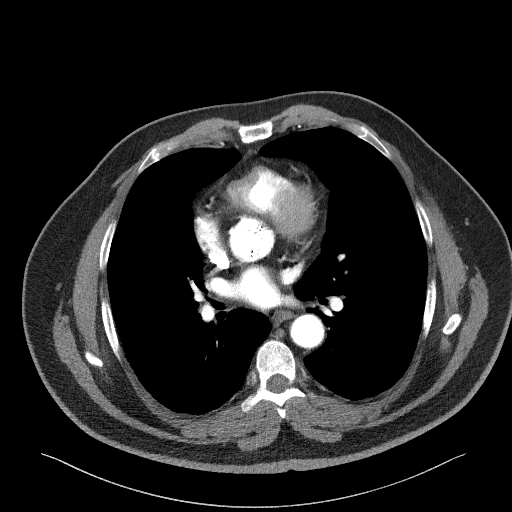
[im 75/121  lung]
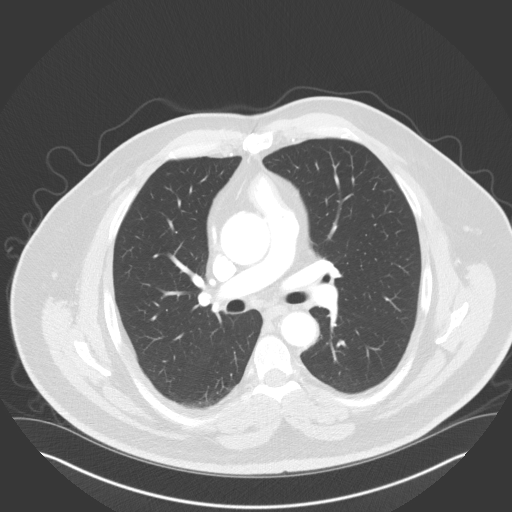
[im 83/121  soft-tissue]
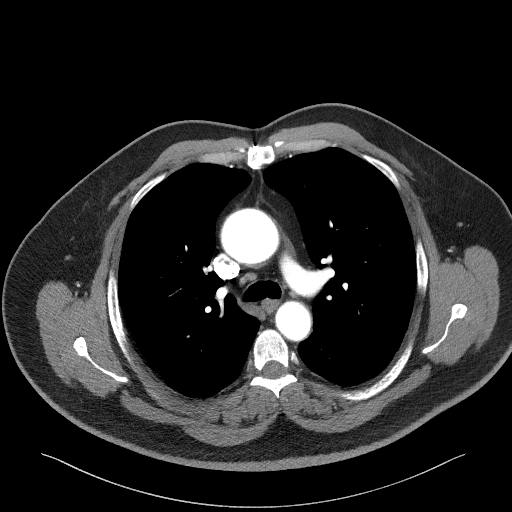
[im 92/121  lung]
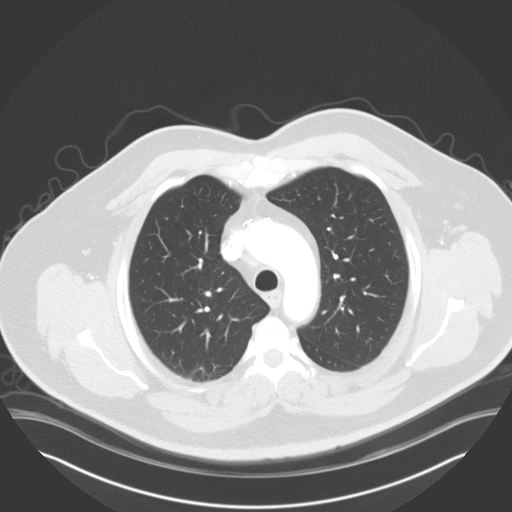
[im 100/121  soft-tissue]
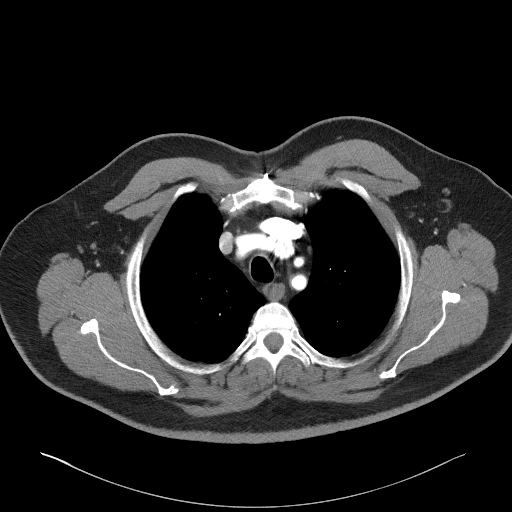
[im 108/121  lung]
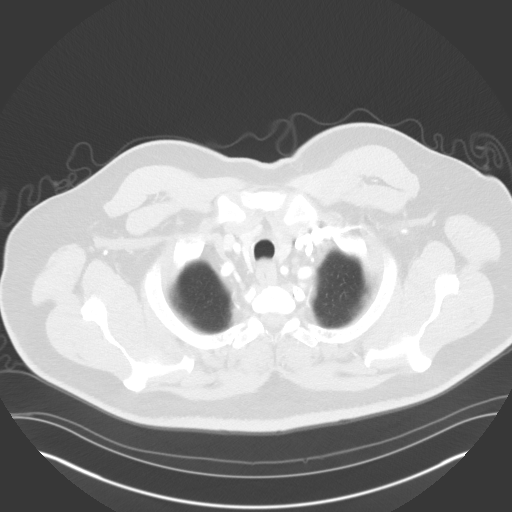
[im 116/121  soft-tissue]
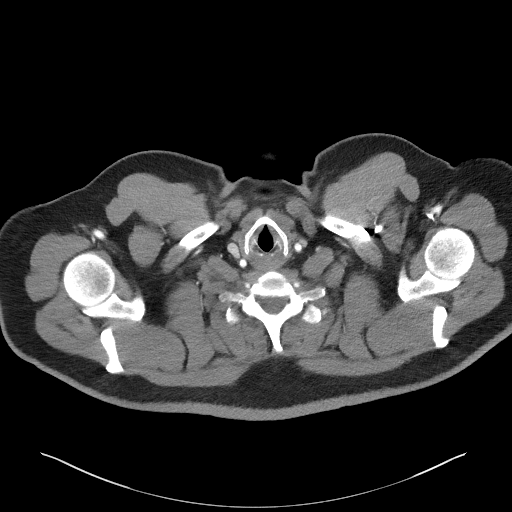

[Series 7: coronals · coronal · 0.72mm/px · 3 of 152 slices shown]
[im 38/152  soft-tissue]
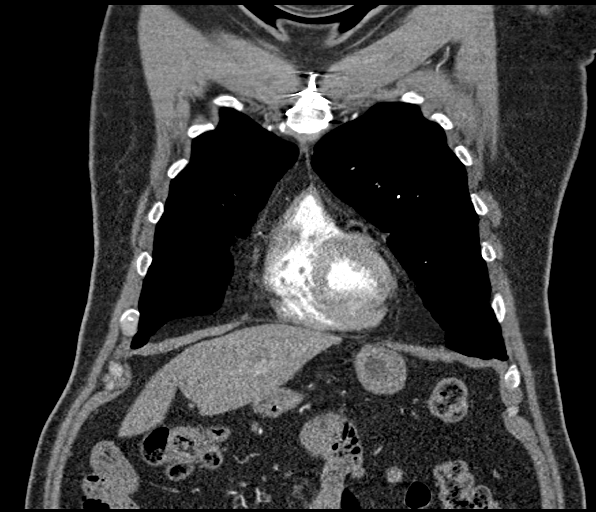
[im 76/152  soft-tissue]
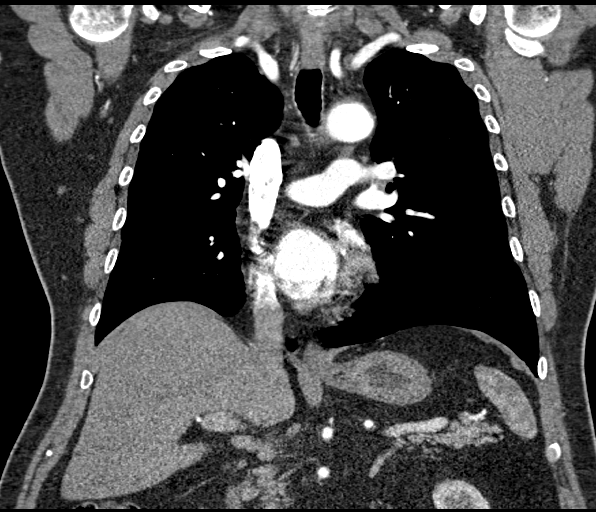
[im 114/152  soft-tissue]
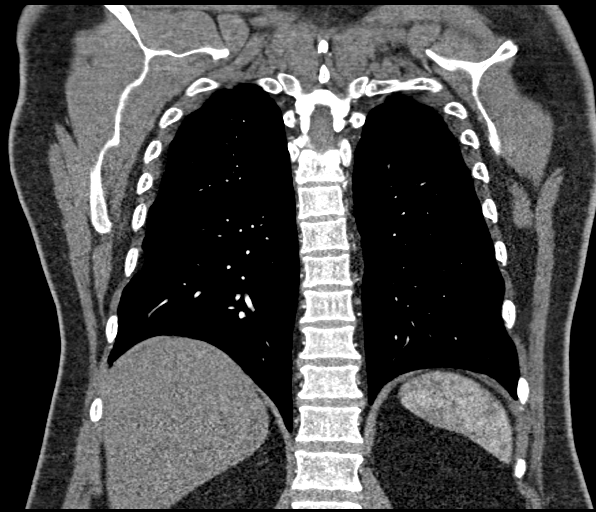

[17 of 46 positions shown; findings below may reference images not displayed]

FINDINGS: Cardiovascular: Prior median sternotomy and prosthetic aortic valve
replacement. Maximum diameter of the ascending thoracic aorta
measures up to 4.8 cm. The aorta size is unchanged after re-
measuring the study from 4911. No evidence for an aortic dissection.
Proximal aortic arch measures 4.0 cm and stable. Normal arch
configuration. Great vessels are patent. Small amount of
atherosclerotic disease at the origin of the left subclavian artery.
Proximal descending thoracic aorta measures 2.9 cm and minimally
changed. Distal descending thoracic aorta measures 2.4 cm and
stable. Celiac trunk and the proximal SMA are widely patent. Flow in
bilateral renal arteries. Normal caliber of the proximal abdominal
aorta.

Mediastinum/Nodes: Esophagus is unremarkable. Limited evaluation of
thyroid due to streak artifact from contrast in the left subclavian
vein but no gross thyroid abnormality. No lymph node enlargement in
the chest.

Lungs/Pleura: Trachea and mainstem bronchi are patent. No pleural
effusions. Stable 5 mm nodule in the right lower lobe on sequence 5,
image 59. This is unchanged since 07/31/2010. There is some chronic
pleural thickening along the posterior right lung which has
minimally changed. Stable punctate nodule along the right minor
fissure is unchanged since 2333 and compatible with a benign
finding. Stable 5 mm nodule in the lingula. Stable punctate
nodularity along the left major fissure. No new airspace disease or
lung consolidation. Mild scarring along the medial left lower lobe.

Upper Abdomen: Small hypodensities in the kidneys probably represent
cysts. No acute abnormality in the upper abdomen.

Musculoskeletal: Stable sclerotic densities in the vertebral bodies
likely represent bone islands.

Review of the MIP images confirms the above findings.
IMPRESSION: Stable fusiform aneurysm of the ascending thoracic aorta. Maximum
diameter is 4.8 cm and this is unchanged since 4911. Recommend
semi-annual imaging followup by CTA or MRA and referral to
cardiothoracic surgery if not already obtained. This recommendation
follows 7616 ACCF/AHA/AATS/ACR/ASA/SCA/RAMEZ/DEEQA RAYAAN/ERICH/YENI Guidelines
for the Diagnosis and Management of Patients With Thoracic Aortic
Disease. Circulation. 7616; 121: [id number]

No acute chest findings.

Stable pulmonary nodules. These are compatible with benign nodules
based on the stability.

## 2018-11-09 ENCOUNTER — Inpatient Hospital Stay: Payer: Medicare Other

## 2018-11-09 ENCOUNTER — Other Ambulatory Visit: Payer: Self-pay

## 2018-11-09 ENCOUNTER — Inpatient Hospital Stay (HOSPITAL_BASED_OUTPATIENT_CLINIC_OR_DEPARTMENT_OTHER): Payer: Medicare Other | Admitting: Hematology & Oncology

## 2018-11-09 ENCOUNTER — Inpatient Hospital Stay: Payer: Medicare Other | Attending: Family

## 2018-11-09 VITALS — BP 130/73 | HR 77 | Temp 97.9°F | Resp 19 | Wt 260.0 lb

## 2018-11-09 DIAGNOSIS — M810 Age-related osteoporosis without current pathological fracture: Secondary | ICD-10-CM

## 2018-11-09 DIAGNOSIS — D751 Secondary polycythemia: Secondary | ICD-10-CM | POA: Diagnosis not present

## 2018-11-09 DIAGNOSIS — D500 Iron deficiency anemia secondary to blood loss (chronic): Secondary | ICD-10-CM

## 2018-11-09 DIAGNOSIS — D510 Vitamin B12 deficiency anemia due to intrinsic factor deficiency: Secondary | ICD-10-CM

## 2018-11-09 DIAGNOSIS — R5383 Other fatigue: Secondary | ICD-10-CM

## 2018-11-09 DIAGNOSIS — D45 Polycythemia vera: Secondary | ICD-10-CM

## 2018-11-09 DIAGNOSIS — E119 Type 2 diabetes mellitus without complications: Secondary | ICD-10-CM | POA: Insufficient documentation

## 2018-11-09 DIAGNOSIS — G4733 Obstructive sleep apnea (adult) (pediatric): Secondary | ICD-10-CM

## 2018-11-09 DIAGNOSIS — G4730 Sleep apnea, unspecified: Secondary | ICD-10-CM | POA: Diagnosis not present

## 2018-11-09 DIAGNOSIS — E782 Mixed hyperlipidemia: Secondary | ICD-10-CM

## 2018-11-09 DIAGNOSIS — Z7982 Long term (current) use of aspirin: Secondary | ICD-10-CM

## 2018-11-09 LAB — HEMOGLOBIN A1C
Hgb A1c MFr Bld: 8 % — ABNORMAL HIGH (ref 4.8–5.6)
Mean Plasma Glucose: 182.9 mg/dL

## 2018-11-09 LAB — IRON AND TIBC
Iron: 22 ug/dL — ABNORMAL LOW (ref 42–163)
Saturation Ratios: 5 % — ABNORMAL LOW (ref 20–55)
TIBC: 440 ug/dL — AB (ref 202–409)
UIBC: 418 ug/dL — ABNORMAL HIGH (ref 117–376)

## 2018-11-09 LAB — CMP (CANCER CENTER ONLY)
ALT: 49 U/L — ABNORMAL HIGH (ref 0–44)
AST: 34 U/L (ref 15–41)
Albumin: 4.5 g/dL (ref 3.5–5.0)
Alkaline Phosphatase: 114 U/L (ref 38–126)
Anion gap: 10 (ref 5–15)
BUN: 23 mg/dL (ref 8–23)
CO2: 29 mmol/L (ref 22–32)
Calcium: 9.5 mg/dL (ref 8.9–10.3)
Chloride: 98 mmol/L (ref 98–111)
Creatinine: 1.19 mg/dL (ref 0.61–1.24)
GFR, Est AFR Am: >60 mL/min (ref >60)
GFR, Estimated: >60 mL/min (ref >60)
Glucose, Bld: 303 mg/dL — ABNORMAL HIGH (ref 70–99)
POTASSIUM: 3.6 mmol/L (ref 3.5–5.1)
Sodium: 137 mmol/L (ref 135–145)
Total Bilirubin: 0.3 mg/dL (ref 0.3–1.2)
Total Protein: 7.2 g/dL (ref 6.5–8.1)

## 2018-11-09 LAB — CBC WITH DIFFERENTIAL (CANCER CENTER ONLY)
Abs Immature Granulocytes: 0.05 10*3/uL (ref 0.00–0.07)
Basophils Absolute: 0.2 10*3/uL — ABNORMAL HIGH (ref 0.0–0.1)
Basophils Relative: 2 %
Eosinophils Absolute: 0.4 10*3/uL (ref 0.0–0.5)
Eosinophils Relative: 4 %
HCT: 41.7 % (ref 39.0–52.0)
Hemoglobin: 11.8 g/dL — ABNORMAL LOW (ref 13.0–17.0)
Immature Granulocytes: 1 %
Lymphocytes Relative: 20 %
Lymphs Abs: 1.7 10*3/uL (ref 0.7–4.0)
MCH: 19.2 pg — AB (ref 26.0–34.0)
MCHC: 28.3 g/dL — ABNORMAL LOW (ref 30.0–36.0)
MCV: 68 fL — ABNORMAL LOW (ref 80.0–100.0)
MONO ABS: 0.6 10*3/uL (ref 0.1–1.0)
Monocytes Relative: 8 %
Neutro Abs: 5.6 10*3/uL (ref 1.7–7.7)
Neutrophils Relative %: 65 %
Platelet Count: 261 10*3/uL (ref 150–400)
RBC: 6.13 MIL/uL — ABNORMAL HIGH (ref 4.22–5.81)
RDW: 20 % — AB (ref 11.5–15.5)
WBC Count: 8.5 10*3/uL (ref 4.0–10.5)
nRBC: 0 % (ref 0.0–0.2)

## 2018-11-09 LAB — FERRITIN: Ferritin: 9 ng/mL — ABNORMAL LOW (ref 24–336)

## 2018-11-09 LAB — VITAMIN B12: Vitamin B-12: 252 pg/mL (ref 180–914)

## 2018-11-09 MED ORDER — SODIUM CHLORIDE 0.9 % IV SOLN
INTRAVENOUS | Status: DC
  Filled 2018-11-09: qty 250

## 2018-11-09 NOTE — Progress Notes (Signed)
Hematology and Oncology Follow Up Visit  Chase Gutierrez [id number] April 16, 1951 68 y.o. 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  He just got up here from [place], [place].  He and his wife are down there for the wintertime.  He has been pretty busy down there.  He has been playing a lot of bridge.  He is a Production assistant, radio.  He has been complaining of just being tired and worn out.  I think some of this might be from the fact that I am sure he is iron deficient.  Today, his ferritin is 9 with an iron saturation of 5%.  We did test his vitamin B12 level today.  This was okay at 252.  He feels like he needs a phlebotomy.  His hematocrit is 41.7%.  We will go ahead and phlebotomize him.  He has had no problems with his heart.  He has had no valvular issues.  He has had no chest pain.  He does have sleep apnea.  I am unsure how well he or how often he uses his CPAP.  He has had no fever.  He has had no change in bowel or bladder habits.  He really is not too worried about the coronavirus right now.  Overall, his performance status is ECOG 1.  Medications:  Allergies as of 11/09/2018      Reactions   Provigil [modafinil] Hives      Medication List       Accurate as of November 09, 2018  5:07 PM. Always use your most recent med list.        ALPRAZolam 0.5 MG tablet Commonly known as:  XANAX TAKE ONE TABLET TWICE DAILY AS NEEDED FOR ANXIETY   amitriptyline 25 MG tablet Commonly known as:  ELAVIL Take 25 mg by mouth at bedtime.   aspirin 81 MG EC tablet Take 81 mg by mouth daily.   blood glucose meter kit and supplies Dispense based on patient and insurance preference. Use up to four times daily as directed. (FOR ICD-9 250.00, 250.01).   colchicine 0.6 MG  tablet Commonly known as:  Colcrys Take 1 tablet (0.6 mg total) by mouth as needed.   [name] PO Take 2 tablets by mouth 2 (two) times daily.   furosemide 40 MG tablet Commonly known as:  LASIX Take 40 mg by mouth daily as needed.   hydrochlorothiazide 12.5 MG tablet Commonly known as:  HYDRODIURIL Take 12.5 mg by mouth daily.   hydrOXYzine 10 MG tablet Commonly known as:  ATARAX/VISTARIL TAKE ONE TABLET THREE TIMES DAILY AS NEEDED FOR ITCHING   ibuprofen 400 MG tablet Commonly known as:  ADVIL,MOTRIN Take 400 mg by mouth as needed.   metoprolol tartrate 25 MG tablet Commonly known as:  LOPRESSOR TAKE ONE TABLET BY MOUTH TWICE A DAY   multivitamin capsule Take 1 capsule by mouth daily.   naproxen 500 MG tablet Commonly known as:  NAPROSYN Take 250 mg by mouth as needed.   ONE TOUCH ULTRA TEST test strip Generic drug:  glucose blood CHECK BLOOD GLUCOSE (SUGAR) UPTO 4 TIMESDAILY AS DIRECTED   OneTouch Delica Lancets Fine Misc CHECK BLOOD GLUCOSE (SUGAR) UPTO 4 TIMESDAILY AS DIRECTED   potassium chloride SA 20 MEQ tablet Commonly known as:  K-DUR,KLOR-CON Take 20  mEq by mouth once. Pt. States he takes when he uses Lasix   Synthroid 50 MCG tablet Generic drug:  levothyroxine 50 mcg daily.   triamcinolone 0.1 % paste Commonly known as:  KENALOG Use as directed 1 application in the mouth or throat 2 (two) times daily.   Trulicity 1.5 IH/4.7QQ Sopn Generic drug:  Dulaglutide once a week.       Allergies:  Allergies  Allergen Reactions  . Provigil [Modafinil] Hives    Past Medical History, Surgical history, Social history, and Family History were reviewed and updated.  Review of Systems: Review of Systems  Constitutional: Negative.   HENT: Negative.   Eyes: Negative.   Respiratory: Negative.   Cardiovascular: Negative.   Gastrointestinal: Negative.   Genitourinary: Negative.   Musculoskeletal: Negative.   Skin: Negative.   Neurological:  Negative.   Endo/Heme/Allergies: Negative.   Psychiatric/Behavioral: Negative.       Physical Exam:  vitals were not taken for this visit.   Wt Readings from Last 3 Encounters:  11/09/18 260 lb (117.9 kg)  08/14/18 256 lb 9.6 oz (116.4 kg)  08/10/18 257 lb (116.6 kg)   Physical Exam Vitals signs reviewed.  HENT:     Head: Normocephalic and atraumatic.  Eyes:     Pupils: Pupils are equal, round, and reactive to light.  Neck:     Musculoskeletal: Normal range of motion.  Cardiovascular:     Rate and Rhythm: Normal rate and regular rhythm.     Heart sounds: Normal heart sounds.  Pulmonary:     Effort: Pulmonary effort is normal.     Breath sounds: Normal breath sounds.  Abdominal:     General: Bowel sounds are normal.     Palpations: Abdomen is soft.  Musculoskeletal: Normal range of motion.        General: No tenderness or deformity.  Lymphadenopathy:     Cervical: No cervical adenopathy.  Skin:    General: Skin is warm and dry.     Findings: No erythema or rash.  Neurological:     Mental Status: He is alert and oriented to person, place, and time.  Psychiatric:        Behavior: Behavior normal.        Thought Content: Thought content normal.        Judgment: Judgment normal.      Lab Results  Component Value Date   WBC 8.5 11/09/2018   HGB 11.8 (L) 11/09/2018   HCT 41.7 11/09/2018   MCV 68.0 (L) 11/09/2018   PLT 261 11/09/2018   Lab Results  Component Value Date   FERRITIN 9 (L) 11/09/2018   IRON 22 (L) 11/09/2018   TIBC 440 (H) 11/09/2018   UIBC 418 (H) 11/09/2018   IRONPCTSAT 5 (L) 11/09/2018   Lab Results  Component Value Date   RETICCTPCT 1.8 08/10/2018   RBC 6.13 (H) 11/09/2018   RETICCTABS 93.6 08/08/2013   No results found for: KPAFRELGTCHN, LAMBDASER, KAPLAMBRATIO No results found for: IGGSERUM, IGA, IGMSERUM No results found for: Ronnald Ramp, A1GS, A2GS, Violet Baldy, MSPIKE, SPEI   Chemistry      Component Value  Date/Time   NA 137 11/09/2018 0825   NA 144 08/04/2017 1438   NA 143 01/14/2017 0846   K 3.6 11/09/2018 0825   K 4.3 08/04/2017 1438   K 3.6 01/14/2017 0846   CL 98 11/09/2018 0825   CL 101 08/04/2017 1438   CO2 29 11/09/2018 0825   CO2 32  08/04/2017 1438   CO2 31 (H) 01/14/2017 0846   BUN 23 11/09/2018 0825   BUN 17 08/04/2017 1438   BUN 22.2 01/14/2017 0846   CREATININE 1.19 11/09/2018 0825   CREATININE 1.4 (H) 08/04/2017 1438   CREATININE 1.5 (H) 01/14/2017 0846      Component Value Date/Time   CALCIUM 9.5 11/09/2018 0825   CALCIUM 9.3 08/04/2017 1438   CALCIUM 9.9 01/14/2017 0846   ALKPHOS 114 11/09/2018 0825   ALKPHOS 87 (H) 08/04/2017 1438   ALKPHOS 87 01/14/2017 0846   AST 34 11/09/2018 0825   AST 26 01/14/2017 0846   ALT 49 (H) 11/09/2018 0825   ALT 35 08/04/2017 1438   ALT 29 01/14/2017 0846   BILITOT 0.3 11/09/2018 0825   BILITOT 0.54 01/14/2017 0846       Impression and Plan: Chase Gutierrez is a very pleasant 68 yo caucasian gentleman with secondary polycythemia due to sleep apnea.  We do not have to be cautious in the future.  Again, if he continues to feel worn out and tired, we actually may have to think about giving him some iron.  This might be necessary for him to feel better if he cannot improve on his own.  He is heading back down to [place].  I will plan to have him come back to see [place] in about 2 or 3 months.  I think we have to maintain relatively close contact with him.     Volanda Napoleon, MD 3/12/20205:07 PM

## 2018-11-09 NOTE — Progress Notes (Signed)
Chase Gutierrez presents today for phlebotomy per MD orders. Phlebotomy procedure started at 0915 and ended at 0925 via phlebotomy kit to right ac. 600 grams removed.  Pressure dressing applied.  Snack and drink taken.  Patient tolerated procedure well. IV needle removed intact.

## 2018-11-09 NOTE — Patient Instructions (Signed)

## 2018-11-10 ENCOUNTER — Telehealth: Payer: Self-pay | Admitting: *Deleted

## 2018-11-10 ENCOUNTER — Encounter: Payer: Self-pay | Admitting: *Deleted

## 2018-11-10 LAB — VITAMIN D 25 HYDROXY (VIT D DEFICIENCY, FRACTURES): Vit D, 25-Hydroxy: 19.2 ng/mL — ABNORMAL LOW (ref 30.0–100.0)

## 2018-11-10 NOTE — Telephone Encounter (Addendum)
Message left and My Chart message sent  ----- Message from Volanda Napoleon, MD sent at 11/10/2018  8:11 AM EDT ----- Call - Vit D is very low!!! Need to take 2000 units daily!!  Laurey Arrow

## 2018-11-13 ENCOUNTER — Telehealth: Payer: Self-pay | Admitting: Hematology & Oncology

## 2018-11-13 NOTE — Telephone Encounter (Signed)
Tried calling patient unable to reach my phone.  Appointments scheduled letter/calendar mailed per 3/12 los

## 2018-11-14 ENCOUNTER — Telehealth: Payer: Self-pay | Admitting: Family

## 2018-11-14 MED ORDER — ERGOCALCIFEROL 1.25 MG (50000 UT) PO CAPS: 50000.0000 [IU] | ORAL_CAPSULE | ORAL | 3 refills | Status: AC

## 2018-11-14 NOTE — Telephone Encounter (Signed)
Patient feeling very fatigued. He has started B 12 sublingual daily and will start Vitamin D 50,000 units PO once a week. His iron is chronically low due to phlebotomies. He verbalized understanding and agreement with the plan. He will contact our office with any other questions or concerns.

## 2018-11-15 ENCOUNTER — Encounter: Payer: Self-pay | Admitting: Adult Health

## 2018-11-15 ENCOUNTER — Ambulatory Visit (INDEPENDENT_AMBULATORY_CARE_PROVIDER_SITE_OTHER): Payer: Medicare Other | Admitting: Adult Health

## 2018-11-15 ENCOUNTER — Other Ambulatory Visit: Payer: Self-pay

## 2018-11-15 VITALS — BP 128/88 | HR 87 | Ht 71.0 in | Wt 264.0 lb

## 2018-11-15 DIAGNOSIS — G4733 Obstructive sleep apnea (adult) (pediatric): Secondary | ICD-10-CM

## 2018-11-15 DIAGNOSIS — F331 Major depressive disorder, recurrent, moderate: Secondary | ICD-10-CM | POA: Diagnosis not present

## 2018-11-15 MED ORDER — BUPROPION HCL ER (SR) 150 MG PO TB12: 150.0000 mg | ORAL_TABLET | Freq: Two times a day (BID) | ORAL | 2 refills | Status: AC

## 2018-11-15 NOTE — Progress Notes (Signed)
PATIENT: Chase Gutierrez DOB: August 20, 1951  REASON FOR VISIT: follow up HISTORY FROM: patient  Chief Complaint  Patient presents with  . Follow-up    CIPAP follow up room in back hallway pt alone, needs a new mask, lives in [place] and [place], but he lives in [place] majority of the time     [place]:  11/15/18 VISIT Chase Gutierrez is a 68 year old male was last seen in this office with Dr. Brett Fairy on 08/09/2018 regarding OSA management.  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  Review of his compliance report from 10/14/2018 -11/12/2018 shows a 25 out of 30 usage days for 83% compliance with 22 days greater than 4 hours for 73% compliance.  Average usage 6 hours and 15 minutes her residual AHI of 5.0.  Leaks in the 95th percentile 18.5.  EPAP 6 cm H2O with minimal PS 4 cm H2O and max PS 15 cm H2O. He states he has been tolerating ASV well but feels as though he does not feel any difference between BiPAP and ASV.  He does endorse though that his mouth has not felt as dry as it did on prior imaging.  He is questioning whether he can have a new mask fitting as his current mask causes pain on his cheeks.  He does report occasionally falling asleep prior to placing his mask on or he may get up in the middle the night and forget to use his mask back on prior to falling back asleep.  He is aware of importance of improving overall compliance and has been working towards this. Of note, he does report recent transfusion due to underlying anemia and has been having difficulty getting back to his normal routine due to fatigue which he feels as though has increased his depression.  He was previously on a weight loss supplement but this has been discontinued approximately 2 weeks ago as it is no longer covered by his  insurance.  Part of this medication included bupropion and felt as though his depression was stable with this medication.  He currently lives in [place] but returns to this area as needed for doctor's appointment or visiting family.  He has not obtained PCP at this time but plans on establishing care when he returns.      REVIEW OF SYSTEMS: Out of a complete 14 system review of symptoms, the patient complains only of the following symptoms, and all other reviewed systems are negative.  See HPI  Epworth sleepiness scale: 16 Fatigue severity scale: 63 He does endorse increased fatigue at this time due to recent complications with anemia  ALLERGIES: Allergies  Allergen Reactions  . Provigil [Modafinil] Hives    HOME MEDICATIONS: Outpatient Medications Prior to Visit  Medication Sig Dispense Refill  . ALPRAZolam (XANAX) 0.5 MG tablet TAKE ONE TABLET TWICE DAILY AS NEEDED FOR ANXIETY 60 tablet 0  . amitriptyline (ELAVIL) 25 MG tablet Take 25 mg by mouth at bedtime.     Marland Kitchen aspirin 81 MG EC tablet Take 81 mg by mouth daily.    . blood glucose meter kit and supplies Dispense based on patient and insurance preference. Use up to four times daily as directed. (FOR ICD-9 250.00, 250.01). 1 each 0  . colchicine (COLCRYS) 0.6 MG tablet Take 1 tablet (0.6 mg  total) by mouth as needed. 20 tablet 0  . ergocalciferol (VITAMIN D2) 1.25 MG (50000 UT) capsule Take 1 capsule (50,000 Units total) by mouth once a week. 8 capsule 3  . furosemide (LASIX) 40 MG tablet Take 40 mg by mouth daily as needed.    . hydrochlorothiazide (HYDRODIURIL) 12.5 MG tablet Take 12.5 mg by mouth daily.      . hydrOXYzine (ATARAX/VISTARIL) 10 MG tablet TAKE ONE TABLET THREE TIMES DAILY AS NEEDED FOR ITCHING 90 tablet 2  . ibuprofen (ADVIL,MOTRIN) 400 MG tablet Take 400 mg by mouth as needed.      . metoprolol tartrate (LOPRESSOR) 25 MG tablet TAKE ONE TABLET BY MOUTH TWICE A DAY 180 tablet 3  . Multiple Vitamin (MULTIVITAMIN)  capsule Take 1 capsule by mouth daily.      . Naltrexone-Bupropion HCl ([name] PO) Take 2 tablets by mouth 2 (two) times daily.     . naproxen (NAPROSYN) 500 MG tablet Take 250 mg by mouth as needed.     . ONE TOUCH ULTRA TEST test strip CHECK BLOOD GLUCOSE (SUGAR) UPTO 4 TIMESDAILY AS DIRECTED 100 each 3  . ONETOUCH DELICA LANCETS FINE MISC CHECK BLOOD GLUCOSE (SUGAR) UPTO 4 TIMESDAILY AS DIRECTED 100 each 6  . potassium chloride SA (K-DUR,KLOR-CON) 20 MEQ tablet Take 20 mEq by mouth once. Pt. States he takes when he uses Lasix    . SYNTHROID 50 MCG tablet 50 mcg daily.    Marland Kitchen triamcinolone (KENALOG) 0.1 % paste Use as directed 1 application in the mouth or throat 2 (two) times daily. 5 g 6  . TRULICITY 1.5 PP/5.0DT SOPN once a week.     . hydrochlorothiazide (MICROZIDE) 12.5 MG capsule      No facility-administered medications prior to visit.     PAST MEDICAL HISTORY: Past Medical History:  Diagnosis Date  . Adenomatous colon polyp    2005  . Allergic rhinitis   . Aortic stenosis   . Depressed   . Dyslipidemia   . Hypertension   . Nephrolithiasis   . Obesity   . OSA (obstructive sleep apnea)   . Polycythemia vera(238.4) 06/16/2012  . Sinus complaint     PAST SURGICAL HISTORY: Past Surgical History:  Procedure Laterality Date  . AORTIC VALVE REPLACEMENT  10/15/2008  . [place]  . TONSILLECTOMY  1982, 1993    FAMILY HISTORY: Family History  Problem Relation Age of Onset  . Coronary artery disease Mother   . Diabetes Father   . Coronary artery disease Father   . Lung cancer Father     SOCIAL HISTORY: Social History   Socioeconomic History  . Marital status: Divorced    Spouse name: Not on file  . Number of children: 1  . Years of education: Not on file  . Highest education level: Not on file  Occupational History  . Occupation: Medical laboratory scientific officer: UNEMPLOYED  Social Needs  . Financial resource strain: Not on file  . Food insecurity:     Worry: Not on file    Inability: Not on file  . Transportation needs:    Medical: Not on file    Non-medical: Not on file  Tobacco Use  . Smoking status: Never Smoker  . Smokeless tobacco: Never Used  . Tobacco comment: never used  tobacco  Substance and Sexual Activity  . Alcohol use: Yes    Alcohol/week: 0.0 standard drinks    Comment: rare  . Drug use: Not on  file  . Sexual activity: Not on file  Lifestyle  . Physical activity:    Days per week: Not on file    Minutes per session: Not on file  . Stress: Not on file  Relationships  . Social connections:    Talks on phone: Not on file    Gets together: Not on file    Attends religious service: Not on file    Active member of club or organization: Not on file    Attends meetings of clubs or organizations: Not on file    Relationship status: Not on file  . Intimate partner violence:    Fear of current or ex partner: Not on file    Emotionally abused: Not on file    Physically abused: Not on file    Forced sexual activity: Not on file  Other Topics Concern  . Not on file  Social History Narrative   Lives with daughter.      PHYSICAL EXAM  Vitals:   11/15/18 0915  BP: 128/88  Pulse: 87  Weight: 264 lb (119.7 kg)  Height: _0  (1.803 m)   Body mass index is 36.82 kg/m.  Generalized: Well developed, in no acute distress   Neurological examination  Mentation: Alert oriented to time, place, history taking. Follows all commands speech and language fluent Cranial nerve II-XII: Pupils were equal round reactive to light. Extraocular movements were full, visual field were full on confrontational test. Facial sensation and strength were normal. Uvula tongue midline. Head turning and shoulder shrug  were normal and symmetric. Motor: The motor testing reveals 5 over 5 strength of all 4 extremities. Good symmetric motor tone is noted throughout.  Sensory: Sensory testing is intact to soft touch on all 4 extremities. No  evidence of extinction is noted.  Coordination: Cerebellar testing reveals good finger-nose-finger and heel-to-shin bilaterally.  Gait and station: Gait is normal. Tandem gait is normal. Romberg is negative. No drift is seen.  Reflexes: Deep tendon reflexes are symmetric and normal bilaterally.   DIAGNOSTIC DATA (LABS, IMAGING, TESTING) - I reviewed patient records, labs, notes, testing and imaging myself where available.  Lab Results  Component Value Date   WBC 8.5 11/09/2018   HGB 11.8 (L) 11/09/2018   HCT 41.7 11/09/2018   MCV 68.0 (L) 11/09/2018   PLT 261 11/09/2018      Component Value Date/Time   NA 137 11/09/2018 0825   NA 144 08/04/2017 1438   NA 143 01/14/2017 0846   K 3.6 11/09/2018 0825   K 4.3 08/04/2017 1438   K 3.6 01/14/2017 0846   CL 98 11/09/2018 0825   CL 101 08/04/2017 1438   CO2 29 11/09/2018 0825   CO2 32 08/04/2017 1438   CO2 31 (H) 01/14/2017 0846   GLUCOSE 303 (H) 11/09/2018 0825   GLUCOSE 92 08/04/2017 1438   BUN 23 11/09/2018 0825   BUN 17 08/04/2017 1438   BUN 22.2 01/14/2017 0846   CREATININE 1.19 11/09/2018 0825   CREATININE 1.4 (H) 08/04/2017 1438   CREATININE 1.5 (H) 01/14/2017 0846   CALCIUM 9.5 11/09/2018 0825   CALCIUM 9.3 08/04/2017 1438   CALCIUM 9.9 01/14/2017 0846   PROT 7.2 11/09/2018 0825   PROT 7.1 08/04/2017 1438   PROT 7.7 01/14/2017 0846   ALBUMIN 4.5 11/09/2018 0825   ALBUMIN 3.9 08/04/2017 1438   ALBUMIN 4.4 01/14/2017 0846   AST 34 11/09/2018 0825   AST 26 01/14/2017 0846   ALT 49 (H) 11/09/2018 0825  ALT 35 08/04/2017 1438   ALT 29 01/14/2017 0846   ALKPHOS 114 11/09/2018 0825   ALKPHOS 87 (H) 08/04/2017 1438   ALKPHOS 87 01/14/2017 0846   BILITOT 0.3 11/09/2018 0825   BILITOT 0.54 01/14/2017 0846   GFRNONAA >60 11/09/2018 0825   GFRAA >60 11/09/2018 0825   Lab Results  Component Value Date   CHOL 149 11/21/2017   HDL 27 (L) 11/21/2017   LDLCALC 72 11/21/2017   LDLDIRECT 101.0 07/01/2014   TRIG 252 (H)  11/21/2017   CHOLHDL 5.5 (H) 11/21/2017   Lab Results  Component Value Date   HGBA1C 8.0 (H) 11/09/2018   Lab Results  Component Value Date   VITAMINB12 252 11/09/2018   Lab Results  Component Value Date   TSH 1.23 01/09/2015      ASSESSMENT AND PLAN 68 y.o. year old male  has a past medical history of Adenomatous colon polyp, Allergic rhinitis, Aortic stenosis, Depressed, Dyslipidemia, Hypertension, Nephrolithiasis, Obesity, OSA (obstructive sleep apnea), Polycythemia vera(238.4) (06/16/2012), and Sinus complaint. here with follow-up regarding OSA on ASV.  Recommended continuation of current settings and highly encouraged nightly compliance and to ensure use of greater than 4 hours as this will likely improve his overall residual AHI.  Order placed to [facility] in order to obtain new mask fitting.  Order placed for bupropion 150 mg for 3 days and then 150 mg twice daily for depression management but did advise he will need to follow with his PCP in the future for ongoing prescribing.  He will return in 1 year to follow-up with Jinny Blossom, NP or call earlier if needed     Venancio Poisson, MSN, AGNP-BC 11/15/2018, 1:38 PM [facility] [address] [place], [place] [address] [phone]

## 2018-11-15 NOTE — Patient Instructions (Signed)
Continue current settings of ASV machine -order will be sent to her DME company requesting new mask fitting  Initiate Wellbutrin 150 mg daily for 3 days and then increase to 150 mg twice daily -ongoing prescribing and management by PCP  Your follow-up in 1 year or call earlier if needed with Jinny Blossom, NP

## 2018-11-20 ENCOUNTER — Ambulatory Visit: Payer: PRIVATE HEALTH INSURANCE | Admitting: Adult Health

## 2019-01-09 ENCOUNTER — Ambulatory Visit: Payer: Medicare Other | Admitting: Hematology & Oncology

## 2019-01-09 ENCOUNTER — Other Ambulatory Visit: Payer: Medicare Other

## 2019-01-16 ENCOUNTER — Telehealth: Payer: Self-pay

## 2019-01-16 NOTE — Telephone Encounter (Signed)
YOUR CARDIOLOGY TEAM HAS ARRANGED FOR AN E-VISIT FOR YOUR APPOINTMENT - PLEASE REVIEW IMPORTANT INFORMATION BELOW SEVERAL DAYS PRIOR TO YOUR APPOINTMENT  Due to the recent COVID-19 pandemic, we are transitioning in-person office visits to tele-medicine visits in an effort to decrease unnecessary exposure to our patients, their families, and staff. These visits are billed to your insurance just like a normal visit is. We also encourage you to sign up for MyChart if you have not already done so. You will need a smartphone if possible. For patients that do not have this, we can still complete the visit using a regular telephone but do prefer a smartphone to enable video when possible. You may have a family member that lives with you that can help. If possible, we also ask that you have a blood pressure cuff and scale at home to measure your blood pressure, heart rate and weight prior to your scheduled appointment. Patients with clinical needs that need an in-person evaluation and testing will still be able to come to the office if absolutely necessary. If you have any questions, feel free to call our office.     YOUR PROVIDER WILL BE USING THE FOLLOWING PLATFORM TO COMPLETE YOUR VISIT: Doximity  . IF USING MYCHART - How to Download the MyChart App to Your SmartPhone   - If Apple, go to App Store and type in MyChart in the search bar and download the app. If Android, ask patient to go to Google Play Store and type in MyChart in the search bar and download the app. The app is free but as with any other app downloads, your phone may require you to verify saved payment information or Apple/Android password.  - You will need to then log into the app with your MyChart username and password, and select [facility] as your healthcare provider to link the account.  - When it is time for your visit, go to the MyChart app, find appointments, and click Begin Video Visit. Be sure to Select Allow for your device to  access the Microphone and Camera for your visit. You will then be connected, and your provider will be with you shortly.  **If you have any issues connecting or need assistance, please contact MyChart service desk [phone] ([phone])**  **If using a computer, in order to ensure the best quality for your visit, you will need to use either of the following Internet Browsers: Google Chrome or Microsoft Edge**  . IF USING DOXIMITY or DOXY.ME - The staff will give you instructions on receiving your link to join the meeting the day of your visit.      2-3 DAYS BEFORE YOUR APPOINTMENT  You will receive a telephone call from one of our [facility] team members - your caller ID may say "Unknown caller." If this is a video visit, we will walk you through how to get the video launched on your phone. We will remind you check your blood pressure, heart rate and weight prior to your scheduled appointment. If you have an Apple Watch or Kardia, please upload any pertinent ECG strips the day before or morning of your appointment to MyChart. Our staff will also make sure you have reviewed the consent and agree to move forward with your scheduled tele-health visit.     THE DAY OF YOUR APPOINTMENT  Approximately 15 minutes prior to your scheduled appointment, you will receive a telephone call from one of HeartCare team - your caller ID may say "Unknown caller."    Our staff will confirm medications, vital signs for the day and any symptoms you may be experiencing. Please have this information available prior to the time of visit start. It may also be helpful for you to have a pad of paper and pen handy for any instructions given during your visit. They will also walk you through joining the smartphone meeting if this is a video visit.    CONSENT FOR TELE-HEALTH VISIT - PLEASE REVIEW  I hereby voluntarily request, consent and authorize [facility] and its employed or contracted physicians, physician  assistants, nurse practitioners or other licensed health care professionals (the Practitioner), to provide me with telemedicine health care services (the "Services") as deemed necessary by the treating Practitioner. I acknowledge and consent to receive the Services by the Practitioner via telemedicine. I understand that the telemedicine visit will involve communicating with the Practitioner through live audiovisual communication technology and the disclosure of certain medical information by electronic transmission. I acknowledge that I have been given the opportunity to request an in-person assessment or other available alternative prior to the telemedicine visit and am voluntarily participating in the telemedicine visit.  I understand that I have the right to withhold or withdraw my consent to the use of telemedicine in the course of my care at any time, without affecting my right to future care or treatment, and that the Practitioner or I may terminate the telemedicine visit at any time. I understand that I have the right to inspect all information obtained and/or recorded in the course of the telemedicine visit and may receive copies of available information for a reasonable fee.  I understand that some of the potential risks of receiving the Services via telemedicine include:  . Delay or interruption in medical evaluation due to technological equipment failure or disruption; . Information transmitted may not be sufficient (e.g. poor resolution of images) to allow for appropriate medical decision making by the Practitioner; and/or  . In rare instances, security protocols could fail, causing a breach of personal health information.  Furthermore, I acknowledge that it is my responsibility to provide information about my medical history, conditions and care that is complete and accurate to the best of my ability. I acknowledge that Practitioner's advice, recommendations, and/or decision may be based on  factors not within their control, such as incomplete or inaccurate data provided by me or distortions of diagnostic images or specimens that may result from electronic transmissions. I understand that the practice of medicine is not an exact science and that Practitioner makes no warranties or guarantees regarding treatment outcomes. I acknowledge that I will receive a copy of this consent concurrently upon execution via email to the email address I last provided but may also request a printed copy by calling the office of [facility].    I understand that my insurance will be billed for this visit.   I have read or had this consent read to me. . I understand the contents of this consent, which adequately explains the benefits and risks of the Services being provided via telemedicine.  . I have been provided ample opportunity to ask questions regarding this consent and the Services and have had my questions answered to my satisfaction. . I give my informed consent for the services to be provided through the use of telemedicine in my medical care  By participating in this telemedicine visit I agree to the above.  

## 2019-01-17 NOTE — Progress Notes (Signed)
Virtual Visit via Video Note   This visit type was conducted due to national recommendations for restrictions regarding the COVID-19 Pandemic (e.g. social distancing) in an effort to limit this patient's exposure and mitigate transmission in our community.  Due to his co-morbid illnesses, this patient is at least at moderate risk for complications without adequate follow up.  This format is felt to be most appropriate for this patient at this time.  All issues noted in this document were discussed and addressed.  A limited physical exam was performed with this format.  Please refer to the patient's chart for his consent to telehealth for [facility].   Date:  01/18/2019   ID:  Chase Gutierrez, DOB Dec 23, 1950, MRN [id number]  Patient Location: Home Provider Location: Home  PCP:  Marton Redwood, MD  Cardiologist:  Dorris Carnes, MD  Electrophysiologist:  None   Evaluation Performed:  Follow-Up Visit  Chief Complaint:  Follow up  History of Present Illness:    Chase Gutierrez is a 68 y.o. male with hx of aortic valve disease s/p AVR in 2010, HTN, chronic diastolic CHF, thoracic aortic aneurysm, HLD and OSA on BiPAP seen for follow up.   He was doing well when last seen by Dr. Harrington Challenger 07/2018.  Followed by Dr. Marin Olp for secondary polycythemia (sleep apnea)- JAK-2 NEGATIVE. Does intermittent Phlebotomy.   Patient is currently in [facility].  Following proper social distancing protocol.  He denies chest pain, shortness of breath, palpitation, orthopnea, PND, syncope, lower extremity edema or melena.  He only takes Lasix as needed with K-Dur.  Otherwise, compliant with his medication.  He is little fatigued for the past couple of weeks and he thinks this is due to his polycythemia.  He will touch base with Dr. Marin Olp.  He is unsure when he is coming back to [place].  The patient does not have symptoms concerning for COVID-19 infection (fever, chills, cough, or new  shortness of breath).    Past Medical History:  Diagnosis Date  . Adenomatous colon polyp    2005  . Allergic rhinitis   . Aortic stenosis   . Depressed   . Dyslipidemia   . Hypertension   . Nephrolithiasis   . Obesity   . OSA (obstructive sleep apnea)   . Polycythemia vera(238.4) 06/16/2012  . Sinus complaint    Past Surgical History:  Procedure Laterality Date  . AORTIC VALVE REPLACEMENT  10/15/2008  . [place]  . TONSILLECTOMY  1982, 1993     Current Meds  Medication Sig  . ALPRAZolam (XANAX) 0.5 MG tablet TAKE ONE TABLET TWICE DAILY AS NEEDED FOR ANXIETY  . aspirin 81 MG EC tablet Take 81 mg by mouth daily.  . blood glucose meter kit and supplies Dispense based on patient and insurance preference. Use up to four times daily as directed. (FOR ICD-9 250.00, 250.01).  Marland Kitchen buPROPion (WELLBUTRIN SR) 150 MG 12 hr tablet Take 1 tablet (150 mg total) by mouth 2 (two) times daily.  . colchicine (COLCRYS) 0.6 MG tablet Take 1 tablet (0.6 mg total) by mouth as needed.  . ergocalciferol (VITAMIN D2) 1.25 MG (50000 UT) capsule Take 1 capsule (50,000 Units total) by mouth once a week.  . furosemide (LASIX) 40 MG tablet Take 40 mg by mouth daily as needed for fluid.   . hydrochlorothiazide (HYDRODIURIL) 12.5 MG tablet Take 12.5 mg by mouth daily.    . hydrOXYzine (ATARAX/VISTARIL) 10 MG tablet TAKE  ONE TABLET THREE TIMES DAILY AS NEEDED FOR ITCHING  . ibuprofen (ADVIL,MOTRIN) 400 MG tablet Take 400 mg by mouth as needed.    Marland Kitchen losartan (COZAAR) 25 MG tablet Take 25 mg by mouth daily.  . Multiple Vitamin (MULTIVITAMIN) capsule Take 1 capsule by mouth daily.    . naproxen (NAPROSYN) 500 MG tablet Take 250 mg by mouth as needed.   . ONE TOUCH ULTRA TEST test strip CHECK BLOOD GLUCOSE (SUGAR) UPTO 4 TIMESDAILY AS DIRECTED  . ONETOUCH DELICA LANCETS FINE MISC CHECK BLOOD GLUCOSE (SUGAR) UPTO 4 TIMESDAILY AS DIRECTED  . potassium chloride SA (K-DUR,KLOR-CON) 20 MEQ tablet Take 1  tablet by mouth when taking lasix  . SYNTHROID 50 MCG tablet 50 mcg daily.  Marland Kitchen triamcinolone (KENALOG) 0.1 % paste Use as directed 1 application in the mouth or throat as needed.     Allergies:   Provigil [modafinil]   Social History   Tobacco Use  . Smoking status: Never Smoker  . Smokeless tobacco: Never Used  . Tobacco comment: never used  tobacco  Substance Use Topics  . Alcohol use: Yes    Alcohol/week: 0.0 standard drinks    Comment: rare  . Drug use: Not on file     Family Hx: The patient's family history includes Coronary artery disease in his father and mother; Diabetes in his father; Lung cancer in his father.  ROS:   Please see the history of present illness.    All other systems reviewed and are negative.   Prior CV studies:   The following studies were reviewed today:  Echo 05/12/2018 Study Conclusions  - Left ventricle: The cavity size was normal. Wall thickness was   increased in a pattern of moderate LVH. Systolic function was   vigorous. The estimated ejection fraction was in the range of 65%   to 70%. Wall motion was normal; there were no regional wall   motion abnormalities. Doppler parameters are consistent with   abnormal left ventricular relaxation (grade 1 diastolic   dysfunction). - Aortic valve: A bioprosthesis was present. There was no   regurgitation. Peak velocity (S): 279 cm/s. Peak gradient (S): 31   mm Hg. Valve area (VTI): 1.55 cm^2. Valve area (Vmax): 1.44 cm^2.   Valve area (Vmean): 1.63 cm^2. - Aorta: Ascending aortic diameter: 45 mm (S). - Ascending aorta: The ascending aorta was mildly dilated. - Tricuspid valve: There was trivial regurgitation.  CT angio of aorta 04/2018 FINDINGS: Cardiovascular: Stable appearance of prosthetic aortic valve. Stable caliber of the thoracic aorta. The ascending thoracic aorta measures 4.8 cm in maximal diameter. The proximal arch measures 3.8 cm. The distal arch measures 2.8 cm. The descending  thoracic aorta measures 2.8 cm. No evidence of aortic dissection. Normal and stable patency of the proximal great vessels.  The heart size is stable and within normal limits. No pericardial fluid. Central pulmonary arteries are normal in caliber.  Mediastinum/Nodes: No enlarged mediastinal, hilar, or axillary lymph nodes. Thyroid gland, trachea, and esophagus demonstrate no significant findings.  Lungs/Pleura: There is no evidence of pulmonary edema, consolidation, pneumothorax, nodule or pleural fluid.  Upper Abdomen: No acute abnormality.  Musculoskeletal: Stable sclerotic bone islands in the T1 and T3 vertebral bodies.  Review of the MIP images confirms the above findings.  IMPRESSION: Stable aneurysmal disease of the ascending thoracic aorta measuring 4.8 cm in maximal diameter.  Aortic aneurysm NOS (ICD10-I71.9).  Labs/Other Tests and Data Reviewed:    EKG:  No ECG reviewed.  Recent  Labs: 11/09/2018: ALT 49; BUN 23; Creatinine 1.19; Hemoglobin 11.8; Platelet Count 261; Potassium 3.6; Sodium 137   Recent Lipid Panel Lab Results  Component Value Date/Time   CHOL 149 11/21/2017 07:51 AM   TRIG 252 (H) 11/21/2017 07:51 AM   HDL 27 (L) 11/21/2017 07:51 AM   CHOLHDL 5.5 (H) 11/21/2017 07:51 AM   CHOLHDL 5.7 01/08/2015 12:10 PM   LDLCALC 72 11/21/2017 07:51 AM   LDLDIRECT 101.0 07/01/2014 11:02 AM    Wt Readings from Last 3 Encounters:  01/18/19 255 lb (115.7 kg)  11/15/18 264 lb (119.7 kg)  11/09/18 260 lb (117.9 kg)     Objective:    Vital Signs:  Ht 5' 10" (1.778 m)   Wt 255 lb (115.7 kg)   BMI 36.59 kg/m  does not have BP cuff currently  VITAL SIGNS:  reviewed GEN:  no acute distress EYES:  sclerae anicteric, EOMI - Extraocular Movements Intact RESPIRATORY:  normal respiratory effort, symmetric expansion CARDIOVASCULAR:  no peripheral edema SKIN:  no rash, lesions or ulcers. MUSCULOSKELETAL:  no obvious deformities. NEURO:  alert and oriented  x 3, no obvious focal deficit PSYCH:  normal affect  ASSESSMENT & PLAN:    1. S/p AVR - Normal functioning prothesis valve by last echo 04/2018.  No syncope, dizziness or dyspnea.  2. Chronic diastolic CHF -Euvolemic.  Asymptomatic.  Continue risk ECG.  Change Lasix and Kdur to PRN as he been taking for long time.   3. HTN -He will check his blood pressure and checks periodically.  4. OSA - Follows by neuro. compliant with BiPAP.   5.  Polycythemia -Recently feeling fatigue.  He thinks this is due to polycythemia.  He has no cardiac symptoms.  He has Apple Watch and rhythm was in sinus.  He will touch base with Dr. Marin Olp regarding lab work accessibility while [place] of state.  6.  thoracic aortic aneurysm - Stable by last study 04/2018  COVID-19 Education: The signs and symptoms of COVID-19 were discussed with the patient and how to seek care for testing (follow up with PCP or arrange E-visit).  The importance of social distancing was discussed today.  Time:   Today, I have spent 14 minutes with the patient with telehealth technology discussing the above problems.     Medication Adjustments/Labs and Tests Ordered: Current medicines are reviewed at length with the patient today.  Concerns regarding medicines are outlined above.   Tests Ordered: No orders of the defined types were placed in this encounter.   Medication Changes: No orders of the defined types were placed in this encounter.   Disposition:  Follow up in 6 month(s)  Signed, Leanor Kail, PA  01/18/2019 9:16 AM    [facility]

## 2019-01-18 ENCOUNTER — Encounter: Payer: Self-pay | Admitting: Physician Assistant

## 2019-01-18 ENCOUNTER — Other Ambulatory Visit: Payer: Self-pay

## 2019-01-18 ENCOUNTER — Telehealth (INDEPENDENT_AMBULATORY_CARE_PROVIDER_SITE_OTHER): Payer: Medicare Other | Admitting: Physician Assistant

## 2019-01-18 VITALS — Ht 70.0 in | Wt 255.0 lb

## 2019-01-18 DIAGNOSIS — Z952 Presence of prosthetic heart valve: Secondary | ICD-10-CM | POA: Diagnosis not present

## 2019-01-18 DIAGNOSIS — I712 Thoracic aortic aneurysm, without rupture, unspecified: Secondary | ICD-10-CM

## 2019-01-18 DIAGNOSIS — E782 Mixed hyperlipidemia: Secondary | ICD-10-CM

## 2019-01-18 DIAGNOSIS — E7849 Other hyperlipidemia: Secondary | ICD-10-CM

## 2019-01-18 DIAGNOSIS — I10 Essential (primary) hypertension: Secondary | ICD-10-CM

## 2019-01-18 DIAGNOSIS — G4730 Sleep apnea, unspecified: Secondary | ICD-10-CM

## 2019-01-18 NOTE — Patient Instructions (Signed)
Medication Instructions:  Your physician recommends that you continue on your current medications as directed. Please refer to the Current Medication list given to you today.  If you need a refill on your cardiac medications before your next appointment, please call your pharmacy.   Lab work: NONE ORDERED  If you have labs (blood work) drawn today and your tests are completely normal, you will receive your results only by: Marland Kitchen MyChart Message (if you have MyChart) OR . A paper copy in the mail If you have any lab test that is abnormal or we need to change your treatment, we will call you to review the results.  Testing/Procedures: NONE ORDERED  Follow-Up: At [facility], you and your health needs are our priority.  As part of our continuing mission to provide you with exceptional heart care, we have created designated Provider Care Teams.  These Care Teams include your primary Cardiologist (physician) and Advanced Practice Providers (APPs -  Physician Assistants and Nurse Practitioners) who all work together to provide you with the care you need, when you need it. You will need a follow up appointment in:  6 months.  Please call our office 2 months in advance to schedule this appointment.  You may see Dorris Carnes, MD or one of the following Advanced Practice Providers on your designated Care Team: Richardson Dopp, PA-C [place], [place] . Daune Perch, NP  Any Other Special Instructions Will Be Listed Below (If Applicable).

## 2019-02-19 DIAGNOSIS — E1149 Type 2 diabetes mellitus with other diabetic neurological complication: Secondary | ICD-10-CM | POA: Diagnosis not present

## 2019-02-19 DIAGNOSIS — I10 Essential (primary) hypertension: Secondary | ICD-10-CM | POA: Diagnosis not present

## 2019-02-19 DIAGNOSIS — E1129 Type 2 diabetes mellitus with other diabetic kidney complication: Secondary | ICD-10-CM | POA: Diagnosis not present

## 2019-02-19 DIAGNOSIS — H1013 Acute atopic conjunctivitis, bilateral: Secondary | ICD-10-CM | POA: Diagnosis not present

## 2019-02-19 DIAGNOSIS — D751 Secondary polycythemia: Secondary | ICD-10-CM | POA: Diagnosis not present

## 2019-02-19 DIAGNOSIS — E785 Hyperlipidemia, unspecified: Secondary | ICD-10-CM | POA: Diagnosis not present

## 2019-03-19 DIAGNOSIS — M545 Low back pain: Secondary | ICD-10-CM | POA: Diagnosis not present

## 2019-03-19 DIAGNOSIS — M9903 Segmental and somatic dysfunction of lumbar region: Secondary | ICD-10-CM | POA: Diagnosis not present

## 2019-03-19 DIAGNOSIS — M9902 Segmental and somatic dysfunction of thoracic region: Secondary | ICD-10-CM | POA: Diagnosis not present

## 2019-03-19 DIAGNOSIS — M9901 Segmental and somatic dysfunction of cervical region: Secondary | ICD-10-CM | POA: Diagnosis not present

## 2019-03-29 NOTE — Progress Notes (Signed)
I agree with the assessment and plan as directed by NP on this visit . I was available for consultation. I like to follow the patient every third visit, alternating with NP.  Larey Seat, MD

## 2019-04-02 ENCOUNTER — Telehealth: Payer: Self-pay | Admitting: Hematology & Oncology

## 2019-04-02 NOTE — Telephone Encounter (Signed)
Faxed medical records to: [facility] Carrolyn Leigh F: [phone]  P: [phone]    for    Chase Gutierrez Apr 11, 1951      COPY SCANNED

## 2019-04-11 DIAGNOSIS — D45 Polycythemia vera: Secondary | ICD-10-CM | POA: Diagnosis not present

## 2019-04-11 DIAGNOSIS — E119 Type 2 diabetes mellitus without complications: Secondary | ICD-10-CM | POA: Diagnosis not present

## 2019-04-24 DIAGNOSIS — Z23 Encounter for immunization: Secondary | ICD-10-CM | POA: Diagnosis not present

## 2019-05-16 DIAGNOSIS — E291 Testicular hypofunction: Secondary | ICD-10-CM | POA: Diagnosis not present

## 2019-05-16 DIAGNOSIS — N209 Urinary calculus, unspecified: Secondary | ICD-10-CM | POA: Diagnosis not present

## 2019-05-16 DIAGNOSIS — N401 Enlarged prostate with lower urinary tract symptoms: Secondary | ICD-10-CM | POA: Diagnosis not present

## 2019-05-21 DIAGNOSIS — N209 Urinary calculus, unspecified: Secondary | ICD-10-CM | POA: Diagnosis not present

## 2019-05-21 DIAGNOSIS — R140 Abdominal distension (gaseous): Secondary | ICD-10-CM | POA: Diagnosis not present

## 2019-05-21 DIAGNOSIS — R972 Elevated prostate specific antigen [PSA]: Secondary | ICD-10-CM | POA: Diagnosis not present

## 2019-05-21 DIAGNOSIS — R9389 Abnormal findings on diagnostic imaging of other specified body structures: Secondary | ICD-10-CM | POA: Diagnosis not present

## 2019-05-21 DIAGNOSIS — N401 Enlarged prostate with lower urinary tract symptoms: Secondary | ICD-10-CM | POA: Diagnosis not present

## 2019-05-24 DIAGNOSIS — N401 Enlarged prostate with lower urinary tract symptoms: Secondary | ICD-10-CM | POA: Diagnosis not present

## 2019-05-24 DIAGNOSIS — N209 Urinary calculus, unspecified: Secondary | ICD-10-CM | POA: Diagnosis not present

## 2019-05-24 DIAGNOSIS — R972 Elevated prostate specific antigen [PSA]: Secondary | ICD-10-CM | POA: Diagnosis not present

## 2019-06-13 DIAGNOSIS — E663 Overweight: Secondary | ICD-10-CM | POA: Diagnosis not present

## 2019-06-13 DIAGNOSIS — K649 Unspecified hemorrhoids: Secondary | ICD-10-CM | POA: Diagnosis not present

## 2019-06-13 DIAGNOSIS — Z1159 Encounter for screening for other viral diseases: Secondary | ICD-10-CM | POA: Diagnosis not present

## 2019-06-13 DIAGNOSIS — K625 Hemorrhage of anus and rectum: Secondary | ICD-10-CM | POA: Diagnosis not present

## 2019-06-13 DIAGNOSIS — Z86010 Personal history of colonic polyps: Secondary | ICD-10-CM | POA: Diagnosis not present

## 2019-06-19 DIAGNOSIS — R450 Nervousness: Secondary | ICD-10-CM | POA: Diagnosis not present

## 2019-06-19 DIAGNOSIS — E119 Type 2 diabetes mellitus without complications: Secondary | ICD-10-CM | POA: Diagnosis not present

## 2019-06-19 DIAGNOSIS — I351 Nonrheumatic aortic (valve) insufficiency: Secondary | ICD-10-CM | POA: Diagnosis not present

## 2019-06-19 DIAGNOSIS — Z1211 Encounter for screening for malignant neoplasm of colon: Secondary | ICD-10-CM | POA: Diagnosis not present

## 2019-06-19 DIAGNOSIS — G4733 Obstructive sleep apnea (adult) (pediatric): Secondary | ICD-10-CM | POA: Diagnosis not present

## 2019-06-19 DIAGNOSIS — Z86010 Personal history of colonic polyps: Secondary | ICD-10-CM | POA: Diagnosis not present

## 2019-06-19 DIAGNOSIS — K641 Second degree hemorrhoids: Secondary | ICD-10-CM | POA: Diagnosis not present

## 2019-06-19 DIAGNOSIS — I10 Essential (primary) hypertension: Secondary | ICD-10-CM | POA: Diagnosis not present

## 2019-06-19 DIAGNOSIS — Z952 Presence of prosthetic heart valve: Secondary | ICD-10-CM | POA: Diagnosis not present

## 2019-06-19 DIAGNOSIS — E669 Obesity, unspecified: Secondary | ICD-10-CM | POA: Diagnosis not present

## 2019-06-19 DIAGNOSIS — Z6832 Body mass index (BMI) 32.0-32.9, adult: Secondary | ICD-10-CM | POA: Diagnosis not present

## 2019-07-04 DIAGNOSIS — Z79899 Other long term (current) drug therapy: Secondary | ICD-10-CM | POA: Diagnosis not present

## 2019-07-04 DIAGNOSIS — D45 Polycythemia vera: Secondary | ICD-10-CM | POA: Diagnosis not present

## 2019-09-21 DIAGNOSIS — Z23 Encounter for immunization: Secondary | ICD-10-CM | POA: Diagnosis not present

## 2019-09-24 DIAGNOSIS — N209 Urinary calculus, unspecified: Secondary | ICD-10-CM | POA: Diagnosis not present

## 2019-09-24 DIAGNOSIS — R972 Elevated prostate specific antigen [PSA]: Secondary | ICD-10-CM | POA: Diagnosis not present

## 2019-09-26 DIAGNOSIS — D45 Polycythemia vera: Secondary | ICD-10-CM | POA: Diagnosis not present

## 2019-10-17 DIAGNOSIS — Z23 Encounter for immunization: Secondary | ICD-10-CM | POA: Diagnosis not present

## 2019-10-23 DIAGNOSIS — Z1620 Resistance to unspecified antibiotic: Secondary | ICD-10-CM | POA: Diagnosis not present

## 2019-10-23 DIAGNOSIS — R972 Elevated prostate specific antigen [PSA]: Secondary | ICD-10-CM | POA: Diagnosis not present

## 2019-11-21 DIAGNOSIS — D45 Polycythemia vera: Secondary | ICD-10-CM | POA: Diagnosis not present

## 2019-11-21 DIAGNOSIS — E860 Dehydration: Secondary | ICD-10-CM | POA: Diagnosis not present

## 2019-12-11 ENCOUNTER — Telehealth (INDEPENDENT_AMBULATORY_CARE_PROVIDER_SITE_OTHER): Payer: Medicare Other | Admitting: Family Medicine

## 2019-12-11 ENCOUNTER — Encounter: Payer: Self-pay | Admitting: Family Medicine

## 2019-12-11 DIAGNOSIS — G4731 Primary central sleep apnea: Secondary | ICD-10-CM

## 2019-12-11 DIAGNOSIS — G4730 Sleep apnea, unspecified: Secondary | ICD-10-CM

## 2019-12-11 NOTE — Progress Notes (Signed)
PATIENT: Chase Gutierrez DOB: [birth year]  REASON FOR VISIT: follow up HISTORY FROM: patient  Virtual Visit via Telephone Note  I connected with Chase Gutierrez on 12/11/19 at  1:30 PM EDT by telephone and verified that I am speaking with the correct person using two identifiers.   I discussed the limitations, risks, security and privacy concerns of performing an evaluation and management service by telephone and the availability of in person appointments. I also discussed with the patient that there may be a patient responsible charge related to this service. The patient expressed understanding and agreed to proceed.   History of Present Illness:  12/11/19 Chase Gutierrez is a 69 y.o. male here today for follow up of complex sleep apnea on ASV therapy. He admits that he has had some difficulty with compliance recently. He got out of the habit of using his machine. He has stayed in [place] during the pandemic. He does feel better on ASV therapy. He does wish to resume therapy.   Compliance report dated 09/25/2019 through 10/24/2019 reveals that he has used CPAP 8 of the past 30 days for compliance of 27%.  He used CPAP greater than 4 hours 8 of the past 30 days for compliance of 27%.  When looking at compliance over the past 90 days daily compliance was 50% with 4-hour compliance at 48%.  Residual AHI was 4.6 with EPAP of 6 cm of water, minimum pressure support of 4 cm of water and maximum pressure support of 15 cm of water.  Over the past 30 days there was an elevated leak in the 95th percentile of 28.8, however, looking at the past 90 days leak in the 95th percentile was 11.7.   History (copied from [place] note on 11/15/2018)  Chase Gutierrez is a 69 year old male was last seen in this office with Dr. Brett Fairy on 08/09/2018 regarding OSA management.  Per review of prior office note, he does have complex central sleep apnea where he finally responded to ASV therapy after  titration study with a CPAP and BiPAP failing to control central sleep apnea in supine sleep.  Review of his compliance report from 10/14/2018 -11/12/2018 shows a 25 out of 30 usage days for 83% compliance with 22 days greater than 4 hours for 73% compliance.  Average usage 6 hours and 15 minutes her residual AHI of 5.0.  Leaks in the 95th percentile 18.5.  EPAP 6 cm H2O with minimal PS 4 cm H2O and max PS 15 cm H2O. He states he has been tolerating ASV well but feels as though he does not feel any difference between BiPAP and ASV.  He does endorse though that his mouth has not felt as dry as it did on prior imaging.  He is questioning whether he can have a new mask fitting as his current mask causes pain on his cheeks.  He does report occasionally falling asleep prior to placing his mask on or he may get up in the middle the night and forget to use his mask back on prior to falling back asleep.  He is aware of importance of improving overall compliance and has been working towards this. Of note, he does report recent transfusion due to underlying anemia and has been having difficulty getting back to his normal routine due to fatigue which he feels as though has increased his depression.  He was previously on a weight loss supplement but this has been discontinued approximately 2 weeks ago as it  is no longer covered by his insurance.  Part of this medication included bupropion and felt as though his depression was stable with this medication.  He currently lives in [place] but returns to this area as needed for doctor's appointment or visiting family.  He has not obtained PCP at this time but plans on establishing care when he returns.  Observations/Objective:  Generalized: Well developed, in no acute distress  Mentation: Alert oriented to time, place, history taking. Follows all commands speech and language fluent   Assessment and Plan:  69 y.o. year old male  has a past medical history of Adenomatous  colon polyp, Allergic rhinitis, Aortic stenosis, Depressed, Dyslipidemia, Hypertension, Nephrolithiasis, Obesity, OSA (obstructive sleep apnea), Polycythemia vera(238.4) (06/16/2012), and Sinus complaint. here with    ICD-10-CM   1. Complex sleep apnea syndrome  G47.31   2. Sleep apnea with use of continuous positive airway pressure (CPAP)  G47.30    Chase Gutierrez admits that he has had some difficulty with compliance over the past few months. He feels that he has had more trouble getting back in the habit of using ASV due to sinus trouble. He does wish to resume therapy. He was encouraged to resume therapy nightly and greater than 4 hours each night. We will reorder supplies today. I have advised he try Mucinex (plain) as directed by packaging. We will follow up in 3 months to assess compliance. He verbalizes understanding and agreement with this plan.    No orders of the defined types were placed in this encounter.   No orders of the defined types were placed in this encounter.    Follow Up Instructions:  I discussed the assessment and treatment plan with the patient. The patient was provided an opportunity to ask questions and all were answered. The patient agreed with the plan and demonstrated an understanding of the instructions.   The patient was advised to call back or seek an in-person evaluation if the symptoms worsen or if the condition fails to improve as anticipated.  I provided 15 minutes of non-face-to-face time during this encounter. Patient is located at his place of residence during [place] visit. Provider is in the office.    Debbora Presto, NP

## 2019-12-12 NOTE — Progress Notes (Signed)
I agree with the assessment and plan as directed by NP Lomax on this visit . The patient was seen remotely- not in person. He has CSA on ASV and had trouble with compliance, reportedly due to Sinusitis and post nasal drip. He may need to see a PCP or ENT in [facility] while residing there.   I was available for consultation.   Gracianna Vink, MD

## 2019-12-12 NOTE — Progress Notes (Signed)
A community message has been sent to [facility].

## 2019-12-17 ENCOUNTER — Telehealth: Payer: Self-pay | Admitting: Family Medicine

## 2019-12-17 NOTE — Telephone Encounter (Signed)
Pt called wanting to know if the RN can resend the prescription for his Cpap supplies to [facility]. Please advise.

## 2019-12-17 NOTE — Telephone Encounter (Signed)
Received this notice from [facility]: "Well I got it on the 15th and we ask for 7-10 business days for processing, so he's in the stack!"

## 2019-12-17 NOTE — Telephone Encounter (Signed)
Order for cpap supplies sent to [facility] via community message. Confirmation received that the order transmitted was successful.  I called pt and advised him of this. I have asked [facility] to reach out to him when this order is ready. Pt reports having difficulty with [facility]'s service.

## 2020-01-10 DIAGNOSIS — M79672 Pain in left foot: Secondary | ICD-10-CM | POA: Diagnosis not present

## 2020-01-10 DIAGNOSIS — E1142 Type 2 diabetes mellitus with diabetic polyneuropathy: Secondary | ICD-10-CM | POA: Diagnosis not present

## 2020-01-10 DIAGNOSIS — M7730 Calcaneal spur, unspecified foot: Secondary | ICD-10-CM | POA: Diagnosis not present

## 2020-01-10 DIAGNOSIS — B070 Plantar wart: Secondary | ICD-10-CM | POA: Diagnosis not present

## 2020-01-10 DIAGNOSIS — M722 Plantar fascial fibromatosis: Secondary | ICD-10-CM | POA: Diagnosis not present

## 2020-01-16 DIAGNOSIS — L570 Actinic keratosis: Secondary | ICD-10-CM | POA: Diagnosis not present

## 2020-01-16 DIAGNOSIS — D1801 Hemangioma of skin and subcutaneous tissue: Secondary | ICD-10-CM | POA: Diagnosis not present

## 2020-01-16 DIAGNOSIS — D2339 Other benign neoplasm of skin of other parts of face: Secondary | ICD-10-CM | POA: Diagnosis not present

## 2020-01-16 DIAGNOSIS — D485 Neoplasm of uncertain behavior of skin: Secondary | ICD-10-CM | POA: Diagnosis not present

## 2020-01-16 DIAGNOSIS — L814 Other melanin hyperpigmentation: Secondary | ICD-10-CM | POA: Diagnosis not present

## 2020-01-16 DIAGNOSIS — L821 Other seborrheic keratosis: Secondary | ICD-10-CM | POA: Diagnosis not present

## 2020-01-16 DIAGNOSIS — D45 Polycythemia vera: Secondary | ICD-10-CM | POA: Diagnosis not present

## 2020-01-24 DIAGNOSIS — M722 Plantar fascial fibromatosis: Secondary | ICD-10-CM | POA: Diagnosis not present

## 2020-01-24 DIAGNOSIS — E1142 Type 2 diabetes mellitus with diabetic polyneuropathy: Secondary | ICD-10-CM | POA: Diagnosis not present

## 2020-01-24 DIAGNOSIS — M79673 Pain in unspecified foot: Secondary | ICD-10-CM | POA: Diagnosis not present

## 2020-01-31 DIAGNOSIS — M62830 Muscle spasm of back: Secondary | ICD-10-CM | POA: Diagnosis not present

## 2020-01-31 DIAGNOSIS — M9901 Segmental and somatic dysfunction of cervical region: Secondary | ICD-10-CM | POA: Diagnosis not present

## 2020-01-31 DIAGNOSIS — M5412 Radiculopathy, cervical region: Secondary | ICD-10-CM | POA: Diagnosis not present

## 2020-02-07 DIAGNOSIS — M722 Plantar fascial fibromatosis: Secondary | ICD-10-CM | POA: Diagnosis not present

## 2020-02-07 DIAGNOSIS — M79671 Pain in right foot: Secondary | ICD-10-CM | POA: Diagnosis not present

## 2020-02-07 DIAGNOSIS — B070 Plantar wart: Secondary | ICD-10-CM | POA: Diagnosis not present

## 2020-02-07 DIAGNOSIS — E1142 Type 2 diabetes mellitus with diabetic polyneuropathy: Secondary | ICD-10-CM | POA: Diagnosis not present

## 2020-03-12 ENCOUNTER — Telehealth: Payer: Self-pay | Admitting: Family Medicine

## 2020-03-12 NOTE — Telephone Encounter (Signed)
Pt has called to report that he received a letter from [facility] that they are not getting his CPAP readings.  Pt is asking for a call from RN to know if Dr Brett Fairy has access to his readings and what could be suggested

## 2020-03-13 NOTE — Telephone Encounter (Signed)
Spoke to the patient yesterday. I was able to pull his compliance report on resmed that's showed usage at 77%.  Christina from [facility] is going to call me back when she get everything updated.

## 2020-03-13 NOTE — Telephone Encounter (Signed)
Called and LMVM for pt that was able to pull up download cpap report, he can call [facility] left # for them if he needed.  He is to call back if needed.

## 2020-03-13 NOTE — Telephone Encounter (Signed)
I received this email: [name]!  So, after some digging in his notes, [name] appears to still have his machine that he got in 2014. He also has one that he got in 12/2010 but I believe that was an out-of-pocket expense.  HOWEVER - since he moved to [place] - he may have a device with a company down there.  His Medicare address shows he still lives in [place] ... and notes show that he wanted a replacement PAP back in 2020 but was told that we could not provide it since [place] of state.  The machine that we have on file is a ResMed S9 and would require a card download... those machines only had external modems that shut off after 90 days of compliance.  Let me know if I can help in any other way ??  Patient: Chase Gutierrez DOB: 1951/06/07   Vanessa Ralphs Location Manager  an [place] [facility] [place] [facility] Office# [phone] Fax# [phone] [email or web address]

## 2020-03-25 ENCOUNTER — Telehealth: Payer: PRIVATE HEALTH INSURANCE | Admitting: Family Medicine

## 2020-04-01 ENCOUNTER — Telehealth (INDEPENDENT_AMBULATORY_CARE_PROVIDER_SITE_OTHER): Payer: Medicare Other | Admitting: Adult Health

## 2020-04-01 ENCOUNTER — Encounter: Payer: Self-pay | Admitting: Adult Health

## 2020-04-01 DIAGNOSIS — G4731 Primary central sleep apnea: Secondary | ICD-10-CM | POA: Diagnosis not present

## 2020-04-01 NOTE — Progress Notes (Signed)
PATIENT: Chase Gutierrez DOB: April 18, 1951  REASON FOR VISIT: Complex sleep apnea follow up HISTORY FROM: patient  Virtual Visit via Telephone Note  I connected with Chase Gutierrez on 04/01/20 at  8:15 AM EDT by telephone and verified that I am speaking with the correct person using two identifiers with provider located in office and patient located at home.   I discussed the limitations, risks, security and privacy concerns of performing an evaluation and management service by telephone and the availability of in person appointments. I also discussed with the patient that there may be a patient responsible charge related to this service. The patient expressed understanding and agreed to proceed.   History of Present Illness:  Today, 04/01/2020, Chase Gutierrez is being seen via video visit for follow-up regarding complex sleep apnea on ASV therapy.  Video visit today as he has stayed in [place] during the pandemic.  Compliance report from 03/01/2020 -03/30/2020 shows 22 out of 30 usage days with 19 days greater than 4 hours for 63% compliance.  Average usage 5 hours and 58 minutes with residual AHI 3.0.  Leaks in the 95th percentile 4.8. settings min PS 4 and max PS 15 with EPAP 6.  Reports just recently receiving new equipment and mask approximately 1 week ago.  He reports doing much better with full facemask and compliance report does show nightly compliance 7/24 (over the past 9 days).  No concerns at this time.      History provided for reference purposes only Update 12/17/2019 AL: Chase Gutierrez is a 69 y.o. male here today for follow up of complex sleep apnea on ASV therapy. He admits that he has had some difficulty with compliance recently. He got out of the habit of using his machine. He has stayed in [place] during the pandemic. He does feel better on ASV therapy. He does wish to resume therapy.   Compliance report dated 09/25/2019 through 10/24/2019 reveals that he has used  CPAP 8 of the past 30 days for compliance of 27%.  He used CPAP greater than 4 hours 8 of the past 30 days for compliance of 27%.  When looking at compliance over the past 90 days daily compliance was 50% with 4-hour compliance at 48%.  Residual AHI was 4.6 with EPAP of 6 cm of water, minimum pressure support of 4 cm of water and maximum pressure support of 15 cm of water.  Over the past 30 days there was an elevated leak in the 95th percentile of 28.8, however, looking at the past 90 days leak in the 95th percentile was 11.7.  Update 11/15/2018 JM: Chase Gutierrez is a 69 year old male was last seen in this office with Dr. Brett Fairy on 08/09/2018 regarding OSA management.  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  Review of his compliance report from 10/14/2018 -11/12/2018 shows a 25 out of 30 usage days for 83% compliance with 22 days greater than 4 hours for 73% compliance.  Average usage 6 hours and 15 minutes her residual AHI of 5.0.  Leaks in the 95th percentile 18.5.  EPAP 6 cm H2O with minimal PS 4 cm H2O and max PS 15 cm H2O. He states he has been tolerating ASV well but feels as though he does not feel any difference between BiPAP and ASV.  He does endorse though that his mouth has not felt as dry as it did on prior imaging.  He is questioning whether he can have a new mask fitting as his current mask causes pain on his cheeks.  He does report occasionally falling asleep prior to placing his mask on or he may get up in the middle the night and forget to use his mask back on prior to falling back asleep.  He is aware of importance of improving overall compliance and has been working towards this. Of note, he does report recent transfusion due to underlying anemia and has been having difficulty getting back to his normal routine due to  fatigue which he feels as though has increased his depression.  He was previously on a weight loss supplement but this has been discontinued approximately 2 weeks ago as it is no longer covered by his insurance.  Part of this medication included bupropion and felt as though his depression was stable with this medication.  He currently lives in [place] but returns to this area as needed for doctor's appointment or visiting family.  He has not obtained PCP at this time but plans on establishing care when he returns.  Observations/Objective:  Generalized: Well developed, very pleasant middle-aged African-American male, in no acute distress  Mentation: Alert oriented to time, place, history taking. Follows all commands speech and language fluent   Assessment and Plan:  69 y.o. year old male  has a past medical history of Adenomatous colon polyp, Allergic rhinitis, Aortic stenosis, Depressed, Dyslipidemia, Hypertension, Nephrolithiasis, Obesity, OSA (obstructive sleep apnea), Polycythemia vera(238.4) (06/16/2012), and Sinus complaint. here with    ICD-10-CM   1. Complex sleep apnea syndrome  G47.31    Increased compliance since prior visit with greater than 4-hour compliance at 63% with residual AHI of 3.0.  Recently received new equipment and mask approximately 1 week ago and has had nightly compliance over the past 9 days.  Will obtain compliance report in 30 days to ensure improved compliance for insurance purposes.  He will continue to follow with DME company for needed supplies.  Follow-up will be determined once 30-day compliance report pulled    Follow Up Instructions:  I discussed the assessment and treatment plan with the patient. The patient was provided an opportunity to ask questions and all were answered. The patient agreed with the plan and demonstrated an understanding of the instructions.   The patient was advised to call back or seek an in-person evaluation if the symptoms worsen  or if the condition fails to improve as anticipated.  I spent 15 minutes of non-face-to-face time with patient via MyChart video visit.  This included previsit chart review, lab review, study review, order entry, electronic health record documentation, patient education regarding use of ASV for complex sleep apnea and importance of nightly compliance and answered all questions to patient satisfaction    Frann Rider, AGNP-BC  [facility] [address] [place], [place] [address]  Phone [phone] Fax [phone] Note: This document was prepared with digital dictation and possible smart phrase technology. Any transcriptional errors that result from this process are unintentional.

## 2020-05-07 DIAGNOSIS — E119 Type 2 diabetes mellitus without complications: Secondary | ICD-10-CM | POA: Diagnosis not present

## 2020-05-07 DIAGNOSIS — E860 Dehydration: Secondary | ICD-10-CM | POA: Diagnosis not present

## 2020-05-07 DIAGNOSIS — D45 Polycythemia vera: Secondary | ICD-10-CM | POA: Diagnosis not present

## 2020-05-07 DIAGNOSIS — Z23 Encounter for immunization: Secondary | ICD-10-CM | POA: Diagnosis not present

## 2020-05-08 DIAGNOSIS — B351 Tinea unguium: Secondary | ICD-10-CM | POA: Diagnosis not present

## 2020-05-08 DIAGNOSIS — L602 Onychogryphosis: Secondary | ICD-10-CM | POA: Diagnosis not present

## 2020-05-29 DIAGNOSIS — Z23 Encounter for immunization: Secondary | ICD-10-CM | POA: Diagnosis not present

## 2020-06-04 DIAGNOSIS — D45 Polycythemia vera: Secondary | ICD-10-CM | POA: Diagnosis not present

## 2020-06-04 DIAGNOSIS — E119 Type 2 diabetes mellitus without complications: Secondary | ICD-10-CM | POA: Diagnosis not present

## 2020-07-10 DIAGNOSIS — M79675 Pain in left toe(s): Secondary | ICD-10-CM | POA: Diagnosis not present

## 2020-07-10 DIAGNOSIS — B351 Tinea unguium: Secondary | ICD-10-CM | POA: Diagnosis not present

## 2020-07-10 DIAGNOSIS — E1142 Type 2 diabetes mellitus with diabetic polyneuropathy: Secondary | ICD-10-CM | POA: Diagnosis not present

## 2020-07-10 DIAGNOSIS — M79674 Pain in right toe(s): Secondary | ICD-10-CM | POA: Diagnosis not present

## 2020-07-16 DIAGNOSIS — D45 Polycythemia vera: Secondary | ICD-10-CM | POA: Diagnosis not present

## 2020-07-17 DIAGNOSIS — H35371 Puckering of macula, right eye: Secondary | ICD-10-CM | POA: Diagnosis not present

## 2020-07-17 DIAGNOSIS — E119 Type 2 diabetes mellitus without complications: Secondary | ICD-10-CM | POA: Diagnosis not present

## 2020-07-17 DIAGNOSIS — H2513 Age-related nuclear cataract, bilateral: Secondary | ICD-10-CM | POA: Diagnosis not present

## 2020-07-17 DIAGNOSIS — K1321 Leukoplakia of oral mucosa, including tongue: Secondary | ICD-10-CM | POA: Diagnosis not present

## 2020-10-30 ENCOUNTER — Telehealth: Payer: Self-pay | Admitting: Internal Medicine

## 2020-10-30 DIAGNOSIS — I712 Thoracic aortic aneurysm, without rupture, unspecified: Secondary | ICD-10-CM

## 2020-10-30 DIAGNOSIS — R0602 Shortness of breath: Secondary | ICD-10-CM

## 2020-10-30 DIAGNOSIS — I359 Nonrheumatic aortic valve disorder, unspecified: Secondary | ICD-10-CM

## 2020-10-30 DIAGNOSIS — R531 Weakness: Secondary | ICD-10-CM

## 2020-10-30 NOTE — Telephone Encounter (Signed)
Orders, demographics, insurance info faxed to [facility].  They are going to call patient to schedule.

## 2020-10-30 NOTE — Telephone Encounter (Signed)
Spoke to patient earlier in the week    He called to say he wasn't feeling good    Weak  SOB No dizziness     Has been going on for about a weak  He was last seen in 2020   Has been back to [place] only briefly since that time  Recomm:  Lives in [place]   I have contacted [facility]  Would recomm 1.  CT of chest   Reeval aortic aneurysim 2  Echo  Hx of AVReplacement  Spoke to pt today  He says he is feeling better     Still would recomm scans

## 2020-10-30 NOTE — Addendum Note (Signed)
Addended by: Rodman Key on: 10/30/2020 08:53 AM   Modules accepted: Orders

## 2020-10-30 NOTE — Addendum Note (Signed)
Addended by: Rodman Key on: 10/30/2020 09:02 AM   Modules accepted: Orders

## 2020-10-30 NOTE — Telephone Encounter (Addendum)
Orders placed for external testing at [place] [phone])

## 2021-09-23 ENCOUNTER — Telehealth: Payer: Self-pay | Admitting: *Deleted

## 2021-09-23 NOTE — Telephone Encounter (Signed)
Received fax from [place], [place] re: CPAP supplies. Patient was last seen 04/01/2020, did not follow up. Will not sign for supplies. Faxed denial to [facility].
